# Patient Record
Sex: Female | Born: 1955
Health system: Southern US, Community
[De-identification: ages and names within clinical notes are randomized; demographics above are authoritative.]

## PROBLEM LIST (undated history)

## (undated) DIAGNOSIS — M1990 Unspecified osteoarthritis, unspecified site: Secondary | ICD-10-CM

## (undated) DIAGNOSIS — I10 Essential (primary) hypertension: Secondary | ICD-10-CM

---

## 2012-08-19 DIAGNOSIS — K219 Gastro-esophageal reflux disease without esophagitis: Secondary | ICD-10-CM | POA: Diagnosis present

## 2013-02-24 ENCOUNTER — Other Ambulatory Visit (HOSPITAL_COMMUNITY): Payer: Self-pay | Admitting: Chiropractic Medicine

## 2013-02-24 ENCOUNTER — Ambulatory Visit (HOSPITAL_COMMUNITY)
Admission: RE | Admit: 2013-02-24 | Discharge: 2013-02-24 | Disposition: A | Discharge status: Home / Self Care | Payer: Self-pay | Source: Ambulatory Visit | Attending: Chiropractic Medicine | Admitting: Chiropractic Medicine

## 2013-02-24 DIAGNOSIS — M545 Low back pain, unspecified: Secondary | ICD-10-CM

## 2013-02-24 DIAGNOSIS — M5137 Other intervertebral disc degeneration, lumbosacral region: Secondary | ICD-10-CM | POA: Insufficient documentation

## 2013-02-24 DIAGNOSIS — M51379 Other intervertebral disc degeneration, lumbosacral region without mention of lumbar back pain or lower extremity pain: Secondary | ICD-10-CM | POA: Insufficient documentation

## 2013-02-24 DIAGNOSIS — M47817 Spondylosis without myelopathy or radiculopathy, lumbosacral region: Secondary | ICD-10-CM | POA: Insufficient documentation

## 2016-01-09 DIAGNOSIS — F900 Attention-deficit hyperactivity disorder, predominantly inattentive type: Secondary | ICD-10-CM | POA: Diagnosis not present

## 2016-01-09 DIAGNOSIS — F482 Pseudobulbar affect: Secondary | ICD-10-CM | POA: Diagnosis not present

## 2016-01-09 DIAGNOSIS — F4312 Post-traumatic stress disorder, chronic: Secondary | ICD-10-CM | POA: Diagnosis not present

## 2016-02-05 DIAGNOSIS — M62830 Muscle spasm of back: Secondary | ICD-10-CM | POA: Diagnosis not present

## 2016-03-05 DIAGNOSIS — F482 Pseudobulbar affect: Secondary | ICD-10-CM | POA: Diagnosis not present

## 2016-03-05 DIAGNOSIS — F900 Attention-deficit hyperactivity disorder, predominantly inattentive type: Secondary | ICD-10-CM | POA: Diagnosis not present

## 2016-03-05 DIAGNOSIS — F4312 Post-traumatic stress disorder, chronic: Secondary | ICD-10-CM | POA: Diagnosis not present

## 2016-03-21 ENCOUNTER — Encounter (HOSPITAL_COMMUNITY): Payer: Self-pay

## 2016-03-21 ENCOUNTER — Ambulatory Visit (HOSPITAL_COMMUNITY)
Admission: EM | Admit: 2016-03-21 | Discharge: 2016-03-21 | Disposition: A | Discharge status: Home / Self Care | Payer: BLUE CROSS/BLUE SHIELD | Attending: Family Medicine | Admitting: Family Medicine

## 2016-03-21 DIAGNOSIS — M1990 Unspecified osteoarthritis, unspecified site: Secondary | ICD-10-CM | POA: Insufficient documentation

## 2016-03-21 DIAGNOSIS — Z882 Allergy status to sulfonamides status: Secondary | ICD-10-CM | POA: Insufficient documentation

## 2016-03-21 DIAGNOSIS — Z91040 Latex allergy status: Secondary | ICD-10-CM | POA: Diagnosis not present

## 2016-03-21 DIAGNOSIS — Z79899 Other long term (current) drug therapy: Secondary | ICD-10-CM | POA: Diagnosis not present

## 2016-03-21 DIAGNOSIS — I10 Essential (primary) hypertension: Secondary | ICD-10-CM | POA: Insufficient documentation

## 2016-03-21 DIAGNOSIS — N390 Urinary tract infection, site not specified: Secondary | ICD-10-CM | POA: Diagnosis not present

## 2016-03-21 HISTORY — DX: Unspecified osteoarthritis, unspecified site: M19.90

## 2016-03-21 HISTORY — DX: Essential (primary) hypertension: I10

## 2016-03-21 LAB — POCT URINALYSIS DIP (DEVICE)
BILIRUBIN URINE: NEGATIVE
GLUCOSE, UA: NEGATIVE mg/dL
HGB URINE DIPSTICK: NEGATIVE
Ketones, ur: NEGATIVE mg/dL
LEUKOCYTES UA: NEGATIVE
NITRITE: NEGATIVE
Protein, ur: NEGATIVE mg/dL
Specific Gravity, Urine: 1.015 (ref 1.005–1.030)
UROBILINOGEN UA: 0.2 mg/dL (ref 0.0–1.0)
pH: 6.5 (ref 5.0–8.0)

## 2016-03-21 MED ORDER — CEPHALEXIN 500 MG PO CAPS: 500.0000 mg | ORAL_CAPSULE | Freq: Four times a day (QID) | ORAL | 0 refills | Status: DC

## 2016-03-21 NOTE — ED Provider Notes (Signed)
CSN: [id number]     Arrival date & time 03/21/16  1855 History   None    Chief Complaint  Patient presents with  . Urinary Tract Infection   (Consider location/radiation/quality/duration/timing/severity/associated sxs/prior Treatment) The history is provided by the patient. No language interpreter was used.  Urinary Tract Infection  Pain quality:  Aching Pain severity:  Moderate Onset quality:  Gradual Duration:  2 days Timing:  Constant Progression:  Worsening Chronicity:  New Recent urinary tract infections: no   Relieved by:  Nothing Worsened by:  Nothing Ineffective treatments:  None tried Urinary symptoms: frequent urination   Associated symptoms: no abdominal pain   Risk factors: no sexually transmitted infections     Past Medical History:  Diagnosis Date  . Arthritis   . Hypertension    History reviewed. No pertinent surgical history. History reviewed. No pertinent family history. Social History  Substance Use Topics  . Smoking status: Never Smoker  . Smokeless tobacco: Never Used  . Alcohol use No   OB History    No data available     Review of Systems  Gastrointestinal: Negative for abdominal pain.  All other systems reviewed and are negative.   Allergies  Latex and Sulfa antibiotics  Home Medications   Prior to Admission medications   Medication Sig Start Date End Date Taking? Authorizing Provider  amphetamine-dextroamphetamine (ADDERALL) 20 MG tablet Take 20 mg by mouth daily.   Yes Historical Provider, MD  Dextromethorphan-Quinidine (NUEDEXTA PO) Take 200 mg by mouth daily.   Yes Historical Provider, MD  hydrochlorothiazide (HYDRODIURIL) 25 MG tablet Take 25 mg by mouth daily.   Yes Historical Provider, MD  triamterene-hydrochlorothiazide (DYAZIDE) 37.5-25 MG capsule Take 1 capsule by mouth daily.   Yes Historical Provider, MD  cephALEXin (KEFLEX) 500 MG capsule Take 1 capsule (500 mg total) by mouth 4 (four) times daily. 03/21/16   Elson AreasLeslie K  Ketty Bitton, PA-C   Meds Ordered and Administered this Visit  Medications - No data to display  BP 125/80 (BP Location: Left Arm)   Pulse 74   Temp 98.7 F (37.1 C) (Oral)   Resp 17   SpO2 100%  No data found.   Physical Exam  Constitutional: She appears well-developed and well-nourished. No distress.  HENT:  Head: Normocephalic and atraumatic.  Eyes: Conjunctivae are normal.  Neck: Neck supple.  Cardiovascular: Normal rate and regular rhythm.   No murmur heard. Pulmonary/Chest: Effort normal and breath sounds normal. No respiratory distress.  Abdominal: Soft. There is tenderness.  Musculoskeletal: She exhibits no edema.  Neurological: She is alert.  Skin: Skin is warm and dry.  Psychiatric: She has a normal mood and affect.  Nursing note and vitals reviewed.   Urgent Care Course   Clinical Course    Procedures (including critical care time)  Labs Review Labs Reviewed - No data to display  Imaging Review No results found.   Visual Acuity Review  Right Eye Distance:   Left Eye Distance:   Bilateral Distance:    Right Eye Near:   Left Eye Near:    Bilateral Near:         MDM urine dip is negative    1. UTI (lower urinary tract infection)    Meds ordered this encounter  Medications  . triamterene-hydrochlorothiazide (DYAZIDE) 37.5-25 MG capsule    Sig: Take 1 capsule by mouth daily.  . hydrochlorothiazide (HYDRODIURIL) 25 MG tablet    Sig: Take 25 mg by mouth daily.  Marland Kitchen. amphetamine-dextroamphetamine (ADDERALL)  20 MG tablet    Sig: Take 20 mg by mouth daily.  Marland Kitchen Dextromethorphan-Quinidine (NUEDEXTA PO)    Sig: Take 200 mg by mouth daily.  . cephALEXin (KEFLEX) 500 MG capsule    Sig: Take 1 capsule (500 mg total) by mouth 4 (four) times daily.    Dispense:  40 capsule    Refill:  0    Order Specific Question:   Supervising Provider    Answer:   Linna Hoff [phone]   An After Visit Summary was printed and given to the patient.   Elson Areas,  PA-C 03/21/16 1957     Lonia Skinner Clinton, PA-C 03/21/16 2039

## 2016-03-21 NOTE — Discharge Instructions (Signed)
Return if any problems.

## 2016-03-21 NOTE — ED Triage Notes (Signed)
Patient present with possible UTI, symptoms have been lower abdominal pain associated with pressure during urination, urine dark in color x3 days No acute distress

## 2016-03-23 LAB — URINE CULTURE

## 2016-04-29 DIAGNOSIS — Z760 Encounter for issue of repeat prescription: Secondary | ICD-10-CM | POA: Diagnosis not present

## 2016-05-06 DIAGNOSIS — J069 Acute upper respiratory infection, unspecified: Secondary | ICD-10-CM | POA: Diagnosis not present

## 2016-05-06 DIAGNOSIS — E669 Obesity, unspecified: Secondary | ICD-10-CM | POA: Diagnosis not present

## 2016-05-06 DIAGNOSIS — E876 Hypokalemia: Secondary | ICD-10-CM | POA: Diagnosis not present

## 2016-05-06 DIAGNOSIS — R030 Elevated blood-pressure reading, without diagnosis of hypertension: Secondary | ICD-10-CM | POA: Diagnosis not present

## 2016-05-06 DIAGNOSIS — Z8701 Personal history of pneumonia (recurrent): Secondary | ICD-10-CM | POA: Diagnosis not present

## 2016-05-06 DIAGNOSIS — Z6841 Body Mass Index (BMI) 40.0 and over, adult: Secondary | ICD-10-CM | POA: Diagnosis not present

## 2016-05-06 DIAGNOSIS — R05 Cough: Secondary | ICD-10-CM | POA: Diagnosis not present

## 2016-05-06 DIAGNOSIS — R509 Fever, unspecified: Secondary | ICD-10-CM | POA: Diagnosis not present

## 2016-05-07 DIAGNOSIS — L700 Acne vulgaris: Secondary | ICD-10-CM | POA: Diagnosis not present

## 2016-05-07 DIAGNOSIS — E538 Deficiency of other specified B group vitamins: Secondary | ICD-10-CM | POA: Diagnosis not present

## 2016-05-07 DIAGNOSIS — J209 Acute bronchitis, unspecified: Secondary | ICD-10-CM | POA: Diagnosis not present

## 2016-05-07 DIAGNOSIS — E559 Vitamin D deficiency, unspecified: Secondary | ICD-10-CM | POA: Diagnosis not present

## 2016-05-07 DIAGNOSIS — R600 Localized edema: Secondary | ICD-10-CM | POA: Diagnosis not present

## 2016-05-09 DIAGNOSIS — F4312 Post-traumatic stress disorder, chronic: Secondary | ICD-10-CM | POA: Diagnosis not present

## 2016-05-09 DIAGNOSIS — E559 Vitamin D deficiency, unspecified: Secondary | ICD-10-CM | POA: Diagnosis not present

## 2016-05-09 DIAGNOSIS — E538 Deficiency of other specified B group vitamins: Secondary | ICD-10-CM | POA: Diagnosis not present

## 2016-05-09 DIAGNOSIS — Z791 Long term (current) use of non-steroidal anti-inflammatories (NSAID): Secondary | ICD-10-CM | POA: Diagnosis not present

## 2016-05-09 DIAGNOSIS — F900 Attention-deficit hyperactivity disorder, predominantly inattentive type: Secondary | ICD-10-CM | POA: Diagnosis not present

## 2016-05-09 DIAGNOSIS — E611 Iron deficiency: Secondary | ICD-10-CM | POA: Diagnosis not present

## 2016-05-09 DIAGNOSIS — Z9884 Bariatric surgery status: Secondary | ICD-10-CM | POA: Diagnosis not present

## 2016-05-09 DIAGNOSIS — F482 Pseudobulbar affect: Secondary | ICD-10-CM | POA: Diagnosis not present

## 2016-05-09 DIAGNOSIS — K912 Postsurgical malabsorption, not elsewhere classified: Secondary | ICD-10-CM | POA: Diagnosis not present

## 2016-08-29 DIAGNOSIS — F482 Pseudobulbar affect: Secondary | ICD-10-CM | POA: Diagnosis not present

## 2016-08-29 DIAGNOSIS — F900 Attention-deficit hyperactivity disorder, predominantly inattentive type: Secondary | ICD-10-CM | POA: Diagnosis not present

## 2016-08-29 DIAGNOSIS — F4312 Post-traumatic stress disorder, chronic: Secondary | ICD-10-CM | POA: Diagnosis not present

## 2016-11-24 DIAGNOSIS — F4312 Post-traumatic stress disorder, chronic: Secondary | ICD-10-CM | POA: Diagnosis not present

## 2016-11-24 DIAGNOSIS — F900 Attention-deficit hyperactivity disorder, predominantly inattentive type: Secondary | ICD-10-CM | POA: Diagnosis not present

## 2016-11-24 DIAGNOSIS — F482 Pseudobulbar affect: Secondary | ICD-10-CM | POA: Diagnosis not present

## 2016-12-25 DIAGNOSIS — Z1231 Encounter for screening mammogram for malignant neoplasm of breast: Secondary | ICD-10-CM | POA: Diagnosis not present

## 2016-12-25 DIAGNOSIS — I10 Essential (primary) hypertension: Secondary | ICD-10-CM | POA: Diagnosis not present

## 2016-12-25 DIAGNOSIS — F988 Other specified behavioral and emotional disorders with onset usually occurring in childhood and adolescence: Secondary | ICD-10-CM | POA: Diagnosis not present

## 2017-01-06 DIAGNOSIS — N390 Urinary tract infection, site not specified: Secondary | ICD-10-CM | POA: Diagnosis not present

## 2017-01-22 DIAGNOSIS — F900 Attention-deficit hyperactivity disorder, predominantly inattentive type: Secondary | ICD-10-CM | POA: Diagnosis not present

## 2017-01-22 DIAGNOSIS — F4312 Post-traumatic stress disorder, chronic: Secondary | ICD-10-CM | POA: Diagnosis not present

## 2017-01-22 DIAGNOSIS — F482 Pseudobulbar affect: Secondary | ICD-10-CM | POA: Diagnosis not present

## 2017-02-05 DIAGNOSIS — F4310 Post-traumatic stress disorder, unspecified: Secondary | ICD-10-CM | POA: Diagnosis not present

## 2017-02-05 DIAGNOSIS — F902 Attention-deficit hyperactivity disorder, combined type: Secondary | ICD-10-CM | POA: Diagnosis not present

## 2017-02-19 DIAGNOSIS — F902 Attention-deficit hyperactivity disorder, combined type: Secondary | ICD-10-CM | POA: Diagnosis not present

## 2017-02-19 DIAGNOSIS — F4310 Post-traumatic stress disorder, unspecified: Secondary | ICD-10-CM | POA: Diagnosis not present

## 2017-03-13 DIAGNOSIS — F4310 Post-traumatic stress disorder, unspecified: Secondary | ICD-10-CM | POA: Diagnosis not present

## 2017-03-13 DIAGNOSIS — Z1231 Encounter for screening mammogram for malignant neoplasm of breast: Secondary | ICD-10-CM | POA: Diagnosis not present

## 2017-03-13 DIAGNOSIS — F900 Attention-deficit hyperactivity disorder, predominantly inattentive type: Secondary | ICD-10-CM | POA: Diagnosis not present

## 2017-04-16 DIAGNOSIS — L237 Allergic contact dermatitis due to plants, except food: Secondary | ICD-10-CM | POA: Diagnosis not present

## 2017-05-13 DIAGNOSIS — F4310 Post-traumatic stress disorder, unspecified: Secondary | ICD-10-CM | POA: Diagnosis not present

## 2017-05-13 DIAGNOSIS — F900 Attention-deficit hyperactivity disorder, predominantly inattentive type: Secondary | ICD-10-CM | POA: Diagnosis not present

## 2017-06-18 DIAGNOSIS — J069 Acute upper respiratory infection, unspecified: Secondary | ICD-10-CM | POA: Diagnosis not present

## 2017-08-28 DIAGNOSIS — F900 Attention-deficit hyperactivity disorder, predominantly inattentive type: Secondary | ICD-10-CM | POA: Diagnosis not present

## 2017-08-28 DIAGNOSIS — F4310 Post-traumatic stress disorder, unspecified: Secondary | ICD-10-CM | POA: Diagnosis not present

## 2017-10-14 DIAGNOSIS — F901 Attention-deficit hyperactivity disorder, predominantly hyperactive type: Secondary | ICD-10-CM | POA: Diagnosis not present

## 2017-10-14 DIAGNOSIS — I10 Essential (primary) hypertension: Secondary | ICD-10-CM | POA: Diagnosis not present

## 2017-10-14 DIAGNOSIS — R609 Edema, unspecified: Secondary | ICD-10-CM | POA: Diagnosis not present

## 2017-10-28 DIAGNOSIS — M9901 Segmental and somatic dysfunction of cervical region: Secondary | ICD-10-CM | POA: Diagnosis not present

## 2017-10-28 DIAGNOSIS — M9906 Segmental and somatic dysfunction of lower extremity: Secondary | ICD-10-CM | POA: Diagnosis not present

## 2017-10-28 DIAGNOSIS — M9904 Segmental and somatic dysfunction of sacral region: Secondary | ICD-10-CM | POA: Diagnosis not present

## 2017-10-28 DIAGNOSIS — M9903 Segmental and somatic dysfunction of lumbar region: Secondary | ICD-10-CM | POA: Diagnosis not present

## 2017-10-29 DIAGNOSIS — M9903 Segmental and somatic dysfunction of lumbar region: Secondary | ICD-10-CM | POA: Diagnosis not present

## 2017-10-29 DIAGNOSIS — M9901 Segmental and somatic dysfunction of cervical region: Secondary | ICD-10-CM | POA: Diagnosis not present

## 2017-10-29 DIAGNOSIS — M9904 Segmental and somatic dysfunction of sacral region: Secondary | ICD-10-CM | POA: Diagnosis not present

## 2017-10-29 DIAGNOSIS — M9906 Segmental and somatic dysfunction of lower extremity: Secondary | ICD-10-CM | POA: Diagnosis not present

## 2017-10-30 DIAGNOSIS — I10 Essential (primary) hypertension: Secondary | ICD-10-CM | POA: Diagnosis not present

## 2017-10-30 DIAGNOSIS — R609 Edema, unspecified: Secondary | ICD-10-CM | POA: Diagnosis not present

## 2017-10-30 DIAGNOSIS — M130 Polyarthritis, unspecified: Secondary | ICD-10-CM | POA: Diagnosis not present

## 2017-10-30 DIAGNOSIS — F901 Attention-deficit hyperactivity disorder, predominantly hyperactive type: Secondary | ICD-10-CM | POA: Diagnosis not present

## 2017-11-02 DIAGNOSIS — M9906 Segmental and somatic dysfunction of lower extremity: Secondary | ICD-10-CM | POA: Diagnosis not present

## 2017-11-02 DIAGNOSIS — M9901 Segmental and somatic dysfunction of cervical region: Secondary | ICD-10-CM | POA: Diagnosis not present

## 2017-11-02 DIAGNOSIS — M9904 Segmental and somatic dysfunction of sacral region: Secondary | ICD-10-CM | POA: Diagnosis not present

## 2017-11-02 DIAGNOSIS — M9903 Segmental and somatic dysfunction of lumbar region: Secondary | ICD-10-CM | POA: Diagnosis not present

## 2017-11-12 DIAGNOSIS — M25561 Pain in right knee: Secondary | ICD-10-CM | POA: Diagnosis not present

## 2017-11-12 DIAGNOSIS — M1712 Unilateral primary osteoarthritis, left knee: Secondary | ICD-10-CM | POA: Diagnosis not present

## 2017-11-12 DIAGNOSIS — M1711 Unilateral primary osteoarthritis, right knee: Secondary | ICD-10-CM | POA: Diagnosis not present

## 2017-11-12 DIAGNOSIS — M25562 Pain in left knee: Secondary | ICD-10-CM | POA: Diagnosis not present

## 2017-11-12 DIAGNOSIS — M5442 Lumbago with sciatica, left side: Secondary | ICD-10-CM | POA: Diagnosis not present

## 2017-11-27 ENCOUNTER — Other Ambulatory Visit: Payer: Self-pay | Admitting: Orthopedic Surgery

## 2017-11-27 DIAGNOSIS — M5442 Lumbago with sciatica, left side: Secondary | ICD-10-CM

## 2017-12-07 ENCOUNTER — Ambulatory Visit
Admission: RE | Admit: 2017-12-07 | Discharge: 2017-12-07 | Disposition: A | Discharge status: Home / Self Care | Payer: BLUE CROSS/BLUE SHIELD | Source: Ambulatory Visit | Attending: Orthopedic Surgery | Admitting: Orthopedic Surgery

## 2017-12-07 DIAGNOSIS — M48061 Spinal stenosis, lumbar region without neurogenic claudication: Secondary | ICD-10-CM | POA: Diagnosis not present

## 2017-12-07 DIAGNOSIS — M5442 Lumbago with sciatica, left side: Secondary | ICD-10-CM

## 2017-12-14 DIAGNOSIS — F900 Attention-deficit hyperactivity disorder, predominantly inattentive type: Secondary | ICD-10-CM | POA: Diagnosis not present

## 2017-12-14 DIAGNOSIS — F4310 Post-traumatic stress disorder, unspecified: Secondary | ICD-10-CM | POA: Diagnosis not present

## 2017-12-21 DIAGNOSIS — M545 Low back pain: Secondary | ICD-10-CM | POA: Diagnosis not present

## 2017-12-21 DIAGNOSIS — M25461 Effusion, right knee: Secondary | ICD-10-CM | POA: Diagnosis not present

## 2017-12-21 DIAGNOSIS — M5441 Lumbago with sciatica, right side: Secondary | ICD-10-CM | POA: Diagnosis not present

## 2017-12-21 DIAGNOSIS — M5442 Lumbago with sciatica, left side: Secondary | ICD-10-CM | POA: Diagnosis not present

## 2017-12-21 DIAGNOSIS — I10 Essential (primary) hypertension: Secondary | ICD-10-CM | POA: Diagnosis not present

## 2018-01-05 DIAGNOSIS — M5416 Radiculopathy, lumbar region: Secondary | ICD-10-CM | POA: Diagnosis not present

## 2018-01-14 ENCOUNTER — Emergency Department (HOSPITAL_COMMUNITY): Payer: BLUE CROSS/BLUE SHIELD

## 2018-01-14 ENCOUNTER — Emergency Department (HOSPITAL_COMMUNITY)
Admission: EM | Admit: 2018-01-14 | Discharge: 2018-01-14 | Disposition: A | Discharge status: Home / Self Care | Payer: BLUE CROSS/BLUE SHIELD | Attending: Emergency Medicine | Admitting: Emergency Medicine

## 2018-01-14 ENCOUNTER — Encounter (HOSPITAL_COMMUNITY): Payer: Self-pay | Admitting: Emergency Medicine

## 2018-01-14 ENCOUNTER — Other Ambulatory Visit: Payer: Self-pay

## 2018-01-14 DIAGNOSIS — M1711 Unilateral primary osteoarthritis, right knee: Secondary | ICD-10-CM | POA: Diagnosis not present

## 2018-01-14 DIAGNOSIS — R0602 Shortness of breath: Secondary | ICD-10-CM

## 2018-01-14 DIAGNOSIS — Z79899 Other long term (current) drug therapy: Secondary | ICD-10-CM | POA: Insufficient documentation

## 2018-01-14 DIAGNOSIS — R42 Dizziness and giddiness: Secondary | ICD-10-CM | POA: Diagnosis not present

## 2018-01-14 DIAGNOSIS — M79601 Pain in right arm: Secondary | ICD-10-CM

## 2018-01-14 DIAGNOSIS — M1712 Unilateral primary osteoarthritis, left knee: Secondary | ICD-10-CM | POA: Diagnosis not present

## 2018-01-14 LAB — CBC
HCT: 41.3 % (ref 36.0–46.0)
HEMOGLOBIN: 13.7 g/dL (ref 12.0–15.0)
MCH: 30 pg (ref 26.0–34.0)
MCHC: 33.2 g/dL (ref 30.0–36.0)
MCV: 90.4 fL (ref 78.0–100.0)
PLATELETS: 288 10*3/uL (ref 150–400)
RBC: 4.57 MIL/uL (ref 3.87–5.11)
RDW: 13.3 % (ref 11.5–15.5)
WBC: 6 10*3/uL (ref 4.0–10.5)

## 2018-01-14 LAB — BASIC METABOLIC PANEL
ANION GAP: 10 (ref 5–15)
BUN: 11 mg/dL (ref 6–20)
CALCIUM: 9.4 mg/dL (ref 8.9–10.3)
CO2: 29 mmol/L (ref 22–32)
CREATININE: 0.96 mg/dL (ref 0.44–1.00)
Chloride: 101 mmol/L (ref 101–111)
GLUCOSE: 106 mg/dL — AB (ref 65–99)
Potassium: 3.6 mmol/L (ref 3.5–5.1)
Sodium: 140 mmol/L (ref 135–145)

## 2018-01-14 LAB — D-DIMER, QUANTITATIVE: D-Dimer, Quant: 0.41 ug/mL-FEU (ref 0.00–0.50)

## 2018-01-14 LAB — I-STAT TROPONIN, ED: TROPONIN I, POC: 0 ng/mL (ref 0.00–0.08)

## 2018-01-14 NOTE — Discharge Instructions (Signed)
Please read and follow all provided instructions.  Your diagnoses today include:  1. Shortness of breath   2. Pain of right upper extremity     Tests performed today include:  An EKG of your heart  A chest x-ray  Cardiac enzymes - a blood test for heart muscle damage  Blood counts and electrolytes  D-dimer - test to screen for blood clot that was normal  Vital signs. See below for your results today.   Medications prescribed:   None  Take any prescribed medications only as directed.  Follow-up instructions: Please follow-up with your primary care provider as soon as you can for further evaluation of your symptoms.   Return instructions:  SEEK IMMEDIATE MEDICAL ATTENTION IF:  You have severe chest pain, especially if the pain is crushing or pressure-like and spreads to the arms, back, neck, or jaw, or if you have sweating, nausea (feeling sick to your stomach), or shortness of breath. THIS IS AN EMERGENCY. Don't wait to see if the pain will go away. Get medical help at once. Call 911 or 0 (operator). DO NOT drive yourself to the hospital.   Your chest pain gets worse and does not go away with rest.   You have an attack of chest pain lasting longer than usual, despite rest and treatment with the medications your caregiver has prescribed.   You wake from sleep with chest pain or shortness of breath.  You feel dizzy or faint.  You have chest pain not typical of your usual pain for which you originally saw your caregiver.   You have any other emergent concerns regarding your health.  Additional Information: Chest pain comes from many different causes. Your caregiver has diagnosed you as having chest pain that is not specific for one problem, but does not require admission.  You are at low risk for an acute heart condition or other serious illness.   Your vital signs today were: BP (!) 142/75    Pulse 79    Temp 97.6 F (36.4 C) (Oral)    Resp 17    SpO2 97%  If your  blood pressure (BP) was elevated above 135/85 this visit, please have this repeated by your doctor within one month. --------------

## 2018-01-14 NOTE — ED Provider Notes (Signed)
[facility] Provider Note   CSN: [id number] Arrival date & time: 01/14/18  1209     History   Chief Complaint Chief Complaint  Patient presents with  . Shortness of Breath  . Arm Pain    HPI Kara Acevedo is a 62 y.o. female.  Patient with history of hypertension, degenerative disc disease presents with complaints of intermittent right arm pain described as being deep without swelling.  No burning or shooting.  Not worse with movement or palpation.  This is been occurring for about 3 days.  She also notes a change in her exercise tolerance with increasing exertional shortness of breath over the past several days.  She reports that when she walks upstairs she needs to stop and take a break.  She does not have any associated chest pain.  Patient's family was concerned that this could be cardiac in nature, prompting evaluation today.  Patient denies any new lower extremity swelling or pain.  No fevers or cough.  No diaphoresis.  No history of DVT and patient is not anticoagulated. She denies arm pain at the current time.  The onset of this condition was acute. The course is intermittent. Aggravating factors: exertion. Alleviating factors: none.       Past Medical History:  Diagnosis Date  . Arthritis   . Hypertension     There are no active problems to display for this patient.   History reviewed. No pertinent surgical history.   OB History   None      Home Medications    Prior to Admission medications   Medication Sig Start Date End Date Taking? Authorizing Provider  amphetamine-dextroamphetamine (ADDERALL) 15 MG tablet Take 15 mg by mouth daily.    Yes [provider]  mirabegron ER (MYRBETRIQ) 50 MG TB24 tablet Take 50 mg by mouth daily.   Yes [provider]  triamterene-hydrochlorothiazide (DYAZIDE) 37.5-25 MG capsule Take 1 capsule by mouth daily.   Yes [provider]  cephALEXin  (KEFLEX) 500 MG capsule Take 1 capsule (500 mg total) by mouth 4 (four) times daily. Patient not taking: Reported on 01/14/2018 03/21/16   Osie CheeksSofia, Leslie K, PA-C    Family History History reviewed. No pertinent family history.  Social History Social History   Tobacco Use  . Smoking status: Never Smoker  . Smokeless tobacco: Never Used  Substance Use Topics  . Alcohol use: No  . Drug use: No     Allergies   Latex and Sulfa antibiotics   Review of Systems Review of Systems  Constitutional: Negative for fever.  HENT: Negative for rhinorrhea and sore throat.   Eyes: Negative for redness.  Respiratory: Positive for shortness of breath. Negative for cough.   Cardiovascular: Negative for chest pain.  Gastrointestinal: Negative for abdominal pain, diarrhea, nausea and vomiting.  Genitourinary: Negative for dysuria.  Musculoskeletal: Positive for myalgias.  Skin: Negative for rash.  Neurological: Positive for dizziness. Negative for headaches.     Physical Exam Updated Vital Signs BP (!) 148/82   Pulse 96   Temp 97.6 F (36.4 C) (Oral)   Resp 10   SpO2 100%   Physical Exam  Constitutional: She appears well-developed and well-nourished.  HENT:  Head: Normocephalic and atraumatic.  Mouth/Throat: Oropharynx is clear and moist and mucous membranes are normal. Mucous membranes are not dry.  Eyes: Conjunctivae are normal.  Neck: Trachea normal and normal range of motion. Neck supple. Normal carotid pulses and no JVD present.  No muscular tenderness present. Carotid bruit is not present. No tracheal deviation present.  Cardiovascular: Normal rate, regular rhythm, S1 normal, S2 normal, normal heart sounds and intact distal pulses. Exam reveals no decreased pulses.  No murmur heard. Pulses:      Radial pulses are 2+ on the right side, and 2+ on the left side.       Dorsalis pedis pulses are 2+ on the right side, and 2+ on the left side.  Pulmonary/Chest: Effort normal. No  respiratory distress. She has no wheezes. She exhibits no tenderness.  Abdominal: Soft. Normal aorta and bowel sounds are normal. There is no tenderness. There is no rebound and no guarding.  Musculoskeletal: Normal range of motion. She exhibits edema. She exhibits no tenderness.       Right shoulder: She exhibits normal range of motion, no tenderness and no bony tenderness.       Right elbow: Normal.She exhibits normal range of motion and no swelling.       Right wrist: She exhibits normal range of motion and no tenderness.       Cervical back: Normal. She exhibits normal range of motion and no tenderness.       Right upper arm: She exhibits no tenderness, no bony tenderness and no swelling.       Right forearm: She exhibits no tenderness and no bony tenderness.  Trace LE edema, chronic per patient. No clinical signs suggestive of DVT. No tenderness.   Neurological: She is alert.  Skin: Skin is warm and dry. She is not diaphoretic. No cyanosis. No pallor.  Psychiatric: She has a normal mood and affect.  Nursing note and vitals reviewed.    ED Treatments / Results  Labs (all labs ordered are listed, but only abnormal results are displayed) Labs Reviewed  BASIC METABOLIC PANEL - Abnormal; Notable for the following components:      Result Value   Glucose, Bld 106 (*)    All other components within normal limits  CBC  D-DIMER, QUANTITATIVE (NOT AT [facility])  I-STAT TROPONIN, ED    EKG EKG Interpretation  Date/Time:  Thursday January 14 2018 12:18:13 EDT Ventricular Rate:  73 PR Interval:  136 QRS Duration: 90 QT Interval:  402 QTC Calculation: 442 R Axis:   15 Text Interpretation:  Normal sinus rhythm Nonspecific ST and T wave abnormality Abnormal ECG no prior to compare with Confirmed by Meridee Score [phone]) on 01/14/2018 5:45:20 PM   Radiology Dg Chest 2 View  Result Date: 01/14/2018 CLINICAL DATA:  62 year old female with intermittent shortness of breath, dizziness and right  arm pain for 3-4 days. EXAM: CHEST - 2 VIEW COMPARISON:  Lumbar MRI 12/07/2017 FINDINGS: Evidence of a small to moderate gastric hiatal hernia. Mild tortuosity of the thoracic aorta. Other mediastinal contours are within normal limits. Visualized tracheal air column is within normal limits. Lung volumes are within normal limits. Both lungs appear clear. No pneumothorax or pleural effusion. No acute osseous abnormality identified. Negative visible bowel gas pattern. IMPRESSION: No acute cardiopulmonary abnormality. Hiatal hernia. Electronically Signed   By: Odessa Fleming M.D.   On: 01/14/2018 13:07    Procedures Procedures (including critical care time)  Medications Ordered in ED Medications - No data to display   Initial Impression / Assessment and Plan / ED Course  I have reviewed the triage vital signs and the nursing notes.  Pertinent labs & imaging results that were available during my care of the patient were reviewed by me  and considered in my medical decision making (see chart for details).     Patient seen and examined. Work-up reviewed.  To this point cardiac work-up is reassuring.  However shortness of breath symptoms are concerning.  Patient is low risk [place].  Will check age-adjusted d-dimer to determine if CT angiography is necessary.  Discussed this with patient.  Vital signs reviewed and are as follows: BP (!) 148/82   Pulse 96   Temp 97.6 F (36.4 C) (Oral)   Resp 10   SpO2 100%   7:15 PM D-dimer negative. No return of symptoms. Feel comfortable with d/c to home. Symptoms atypical for ACS. Discussed importance of PCP follow-up especially if symptoms continue.  Patient was counseled to return with severe chest pain, especially if the pain is crushing or pressure-like and spreads to the arms, back, neck, or jaw, or if they have sweating, nausea, or shortness of breath with the pain. They were encouraged to call 911 with these symptoms.   They were also told to return if their  chest pain gets worse and does not go away with rest, they have an attack of chest pain lasting longer than usual despite rest and treatment with the medications their caregiver has prescribed, if they wake from sleep with chest pain or shortness of breath, if they feel dizzy or faint, if they have chest pain not typical of their usual pain, or if they have any other emergent concerns regarding their health.  The patient verbalized understanding and agreed.    Final Clinical Impressions(s) / ED Diagnoses   Final diagnoses:  Shortness of breath  Pain of right upper extremity   Patient presents with exertional shortness of breath and right arm pain.  No chest pains.  Initial concern was to see if symptoms were from cardiac etiology.  EKG reassuring with negative troponin.  D-dimer was checked given reported shortness of breath, this was normal.  Arm pain did not recur in the emergency department.  Symptoms are nonexertional and atypical for ACS or PE.  Symptoms are not really reproducible and musculoskeletal pain is considered less likely.  Possibly radicular in nature.  No indications for admission at this time.  Patient has primary care with whom she can follow-up.  Return instructions as above and patient seems reliable to return with symptoms.  Patient came to the emergency department today at the urging of her daughter who is a Theatre stage manager.  ED Discharge Orders    None       Renne Crigler, Cordelia Poche 01/14/18 1918    Terrilee Files, MD 01/16/18 [phone]

## 2018-01-14 NOTE — ED Triage Notes (Signed)
Patient presents to ED for assessment of right arm pain, intermittent x 3 days.  Patient went to have her knees injected today, began to have pain again with shortness of breath and intermittent dizziness.  Patient denies nausea or vomiting.

## 2018-02-17 DIAGNOSIS — M5416 Radiculopathy, lumbar region: Secondary | ICD-10-CM | POA: Diagnosis not present

## 2018-02-22 DIAGNOSIS — R079 Chest pain, unspecified: Secondary | ICD-10-CM | POA: Diagnosis not present

## 2018-03-08 DIAGNOSIS — F4310 Post-traumatic stress disorder, unspecified: Secondary | ICD-10-CM | POA: Diagnosis not present

## 2018-03-08 DIAGNOSIS — F900 Attention-deficit hyperactivity disorder, predominantly inattentive type: Secondary | ICD-10-CM | POA: Diagnosis not present

## 2018-03-08 DIAGNOSIS — M5416 Radiculopathy, lumbar region: Secondary | ICD-10-CM | POA: Diagnosis not present

## 2018-03-29 DIAGNOSIS — Z131 Encounter for screening for diabetes mellitus: Secondary | ICD-10-CM | POA: Diagnosis not present

## 2018-03-29 DIAGNOSIS — M170 Bilateral primary osteoarthritis of knee: Secondary | ICD-10-CM | POA: Diagnosis not present

## 2018-03-29 DIAGNOSIS — Z0189 Encounter for other specified special examinations: Secondary | ICD-10-CM | POA: Diagnosis not present

## 2018-03-29 DIAGNOSIS — Z13220 Encounter for screening for lipoid disorders: Secondary | ICD-10-CM | POA: Diagnosis not present

## 2018-03-29 DIAGNOSIS — I10 Essential (primary) hypertension: Secondary | ICD-10-CM | POA: Diagnosis not present

## 2018-04-21 DIAGNOSIS — M170 Bilateral primary osteoarthritis of knee: Secondary | ICD-10-CM | POA: Diagnosis not present

## 2018-04-21 DIAGNOSIS — E785 Hyperlipidemia, unspecified: Secondary | ICD-10-CM | POA: Diagnosis not present

## 2018-04-21 DIAGNOSIS — I10 Essential (primary) hypertension: Secondary | ICD-10-CM | POA: Diagnosis not present

## 2018-06-14 DIAGNOSIS — F900 Attention-deficit hyperactivity disorder, predominantly inattentive type: Secondary | ICD-10-CM | POA: Diagnosis not present

## 2018-06-14 DIAGNOSIS — F4310 Post-traumatic stress disorder, unspecified: Secondary | ICD-10-CM | POA: Diagnosis not present

## 2018-09-20 DIAGNOSIS — H02839 Dermatochalasis of unspecified eye, unspecified eyelid: Secondary | ICD-10-CM | POA: Diagnosis not present

## 2018-10-07 DIAGNOSIS — F900 Attention-deficit hyperactivity disorder, predominantly inattentive type: Secondary | ICD-10-CM | POA: Diagnosis not present

## 2018-10-07 DIAGNOSIS — F4310 Post-traumatic stress disorder, unspecified: Secondary | ICD-10-CM | POA: Diagnosis not present

## 2018-12-27 IMAGING — DX DG CHEST 2V
2 series · 2 of 2 positions shown · non-contrast
Comparison: Lumbar MRI 12/07/2017

CLINICAL DATA: 61-year-old female with intermittent shortness of
breath, dizziness and right arm pain for 3-4 days.

EXAM:
CHEST - 2 VIEW

[w chest pa]
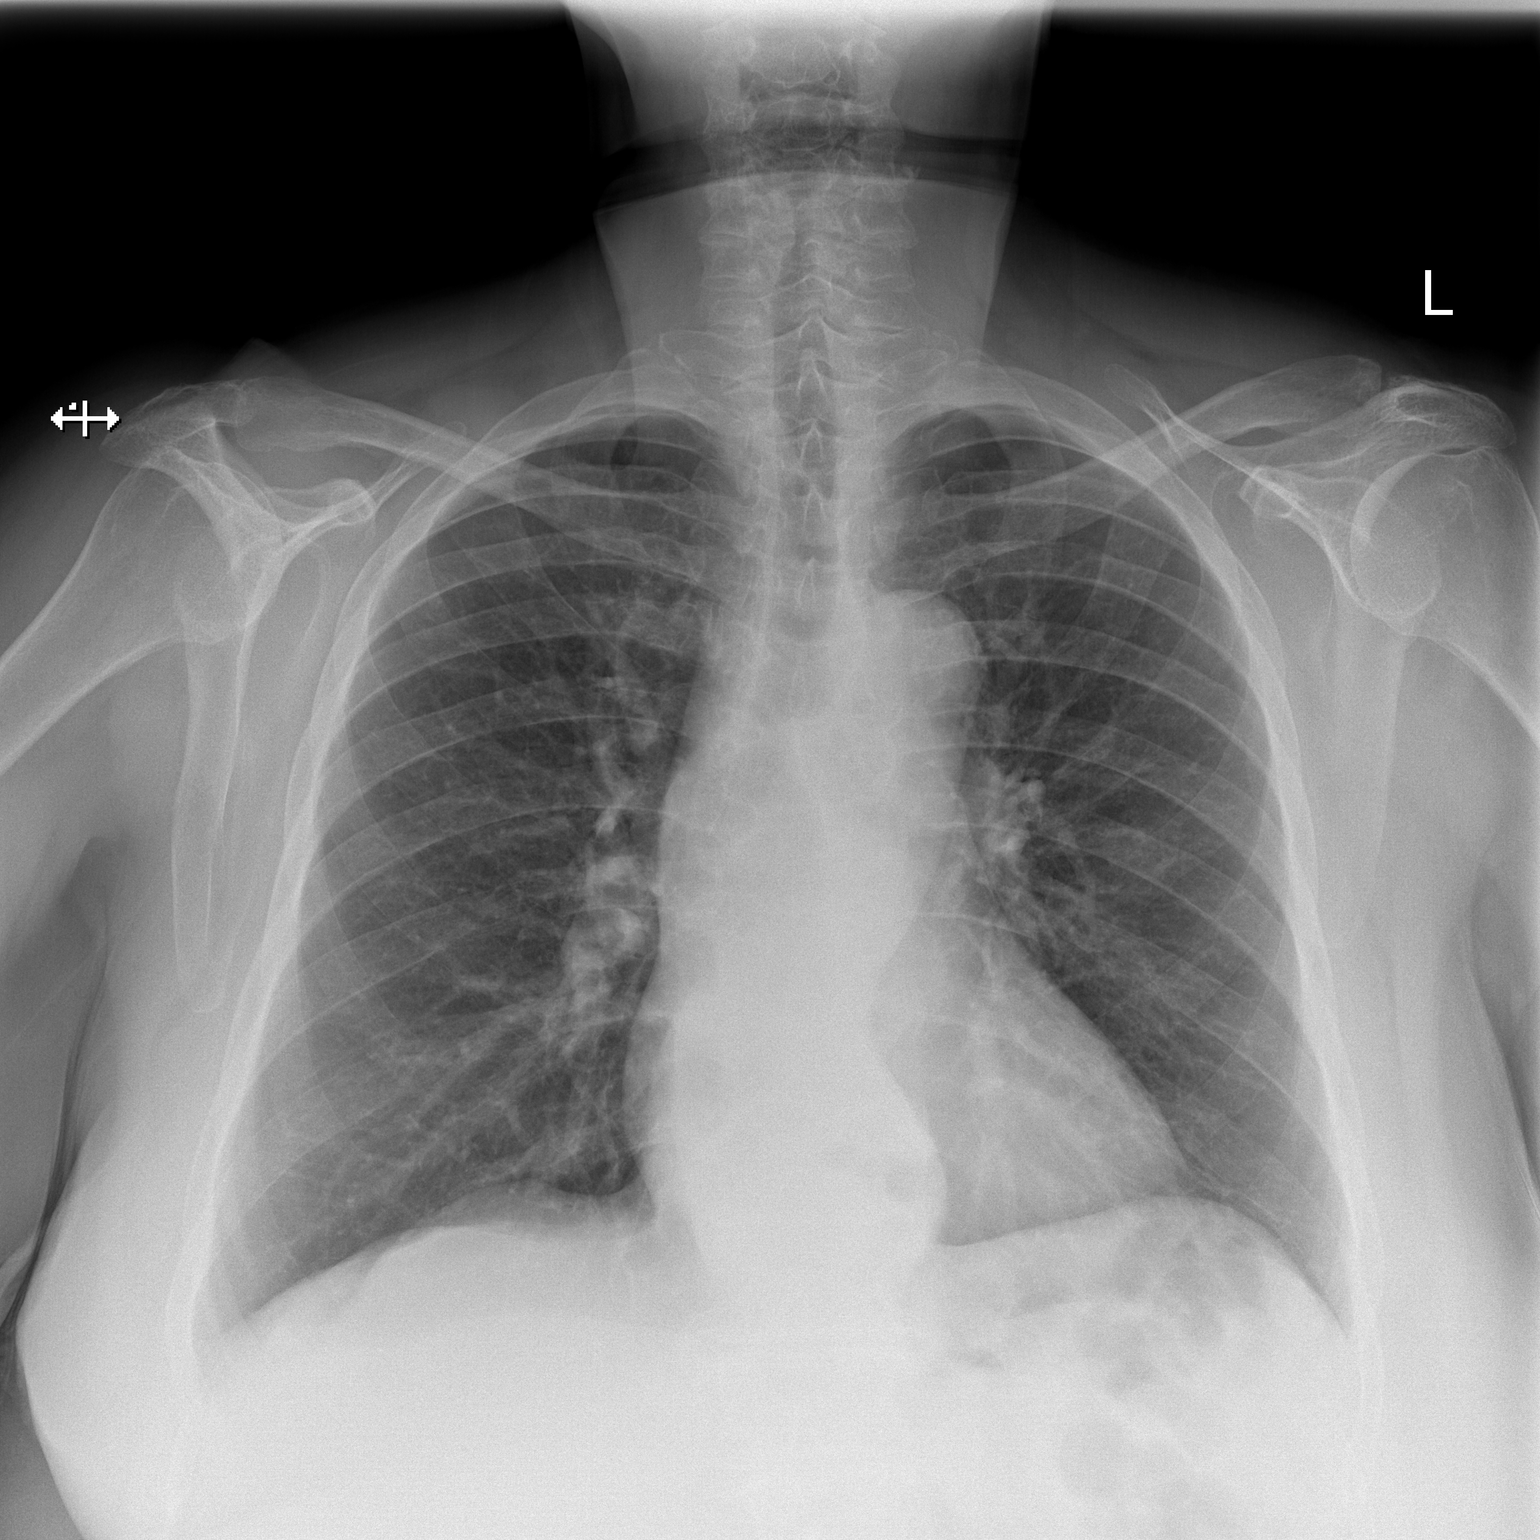

[w chest lat]
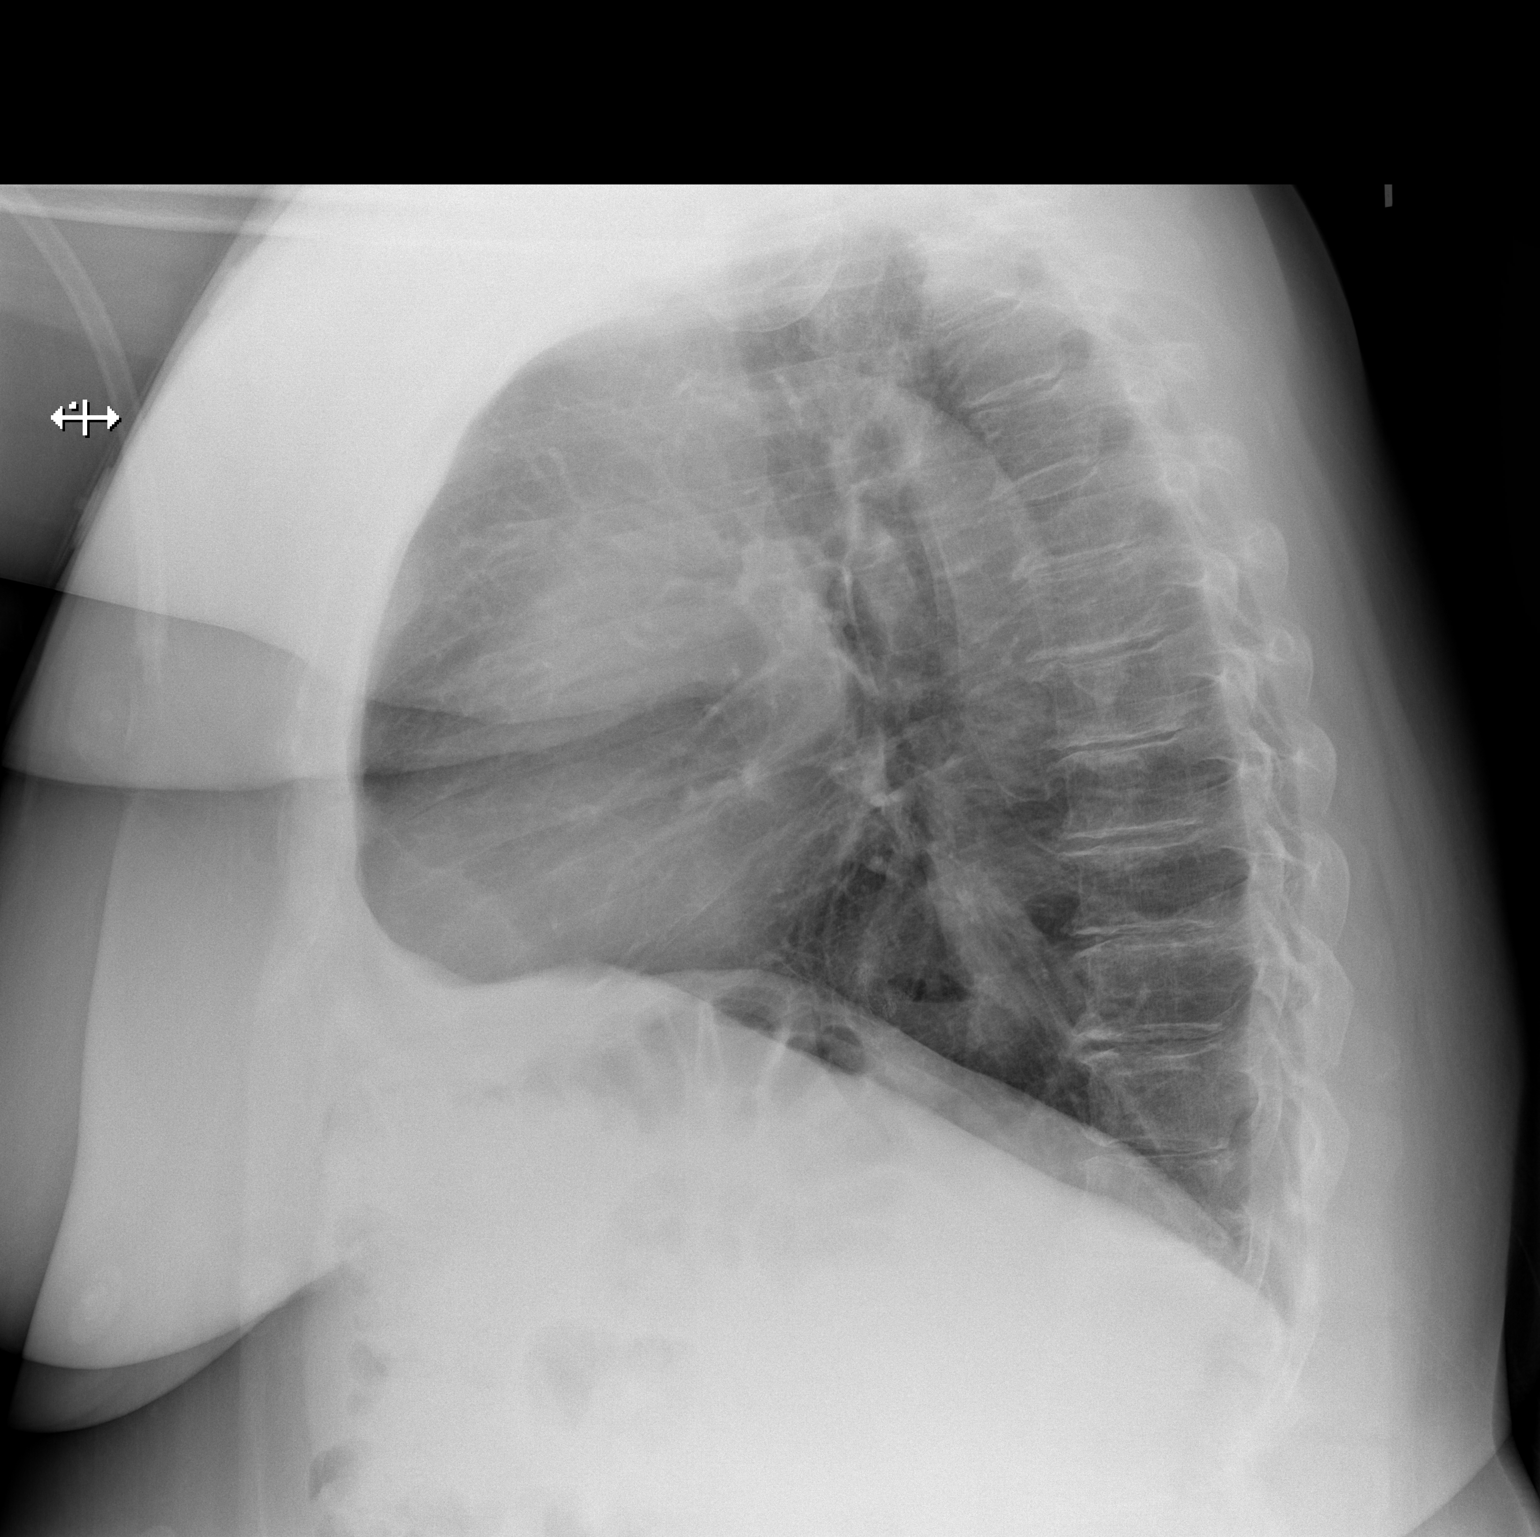

[2 of 2 positions shown; findings below may reference images not displayed]

FINDINGS: Evidence of a small to moderate gastric hiatal hernia. Mild
tortuosity of the thoracic aorta. Other mediastinal contours are
within normal limits. Visualized tracheal air column is within
normal limits. Lung volumes are within normal limits. Both lungs
appear clear. No pneumothorax or pleural effusion. No acute osseous
abnormality identified. Negative visible bowel gas pattern.
IMPRESSION: No acute cardiopulmonary abnormality. Hiatal hernia.

## 2019-01-11 DIAGNOSIS — F900 Attention-deficit hyperactivity disorder, predominantly inattentive type: Secondary | ICD-10-CM | POA: Diagnosis not present

## 2019-01-11 DIAGNOSIS — F4310 Post-traumatic stress disorder, unspecified: Secondary | ICD-10-CM | POA: Diagnosis not present

## 2019-02-10 DIAGNOSIS — F4310 Post-traumatic stress disorder, unspecified: Secondary | ICD-10-CM | POA: Diagnosis not present

## 2019-02-10 DIAGNOSIS — F900 Attention-deficit hyperactivity disorder, predominantly inattentive type: Secondary | ICD-10-CM | POA: Diagnosis not present

## 2019-03-15 DIAGNOSIS — F900 Attention-deficit hyperactivity disorder, predominantly inattentive type: Secondary | ICD-10-CM | POA: Diagnosis not present

## 2019-03-15 DIAGNOSIS — F4310 Post-traumatic stress disorder, unspecified: Secondary | ICD-10-CM | POA: Diagnosis not present

## 2019-06-07 DIAGNOSIS — F4310 Post-traumatic stress disorder, unspecified: Secondary | ICD-10-CM | POA: Diagnosis not present

## 2019-06-07 DIAGNOSIS — F900 Attention-deficit hyperactivity disorder, predominantly inattentive type: Secondary | ICD-10-CM | POA: Diagnosis not present

## 2019-08-31 DIAGNOSIS — Z23 Encounter for immunization: Secondary | ICD-10-CM | POA: Diagnosis not present

## 2019-09-05 DIAGNOSIS — F900 Attention-deficit hyperactivity disorder, predominantly inattentive type: Secondary | ICD-10-CM | POA: Diagnosis not present

## 2019-09-05 DIAGNOSIS — F4310 Post-traumatic stress disorder, unspecified: Secondary | ICD-10-CM | POA: Diagnosis not present

## 2019-09-28 DIAGNOSIS — Z23 Encounter for immunization: Secondary | ICD-10-CM | POA: Diagnosis not present

## 2019-12-05 DIAGNOSIS — F902 Attention-deficit hyperactivity disorder, combined type: Secondary | ICD-10-CM | POA: Diagnosis not present

## 2020-01-30 DIAGNOSIS — I10 Essential (primary) hypertension: Secondary | ICD-10-CM | POA: Diagnosis not present

## 2020-01-30 DIAGNOSIS — E559 Vitamin D deficiency, unspecified: Secondary | ICD-10-CM | POA: Diagnosis not present

## 2020-01-30 DIAGNOSIS — F900 Attention-deficit hyperactivity disorder, predominantly inattentive type: Secondary | ICD-10-CM | POA: Diagnosis not present

## 2020-01-30 DIAGNOSIS — E538 Deficiency of other specified B group vitamins: Secondary | ICD-10-CM | POA: Diagnosis not present

## 2020-05-22 DIAGNOSIS — F902 Attention-deficit hyperactivity disorder, combined type: Secondary | ICD-10-CM | POA: Diagnosis not present

## 2020-05-24 DIAGNOSIS — M79605 Pain in left leg: Secondary | ICD-10-CM | POA: Diagnosis not present

## 2020-05-24 DIAGNOSIS — M1712 Unilateral primary osteoarthritis, left knee: Secondary | ICD-10-CM | POA: Diagnosis not present

## 2020-06-27 DIAGNOSIS — Z23 Encounter for immunization: Secondary | ICD-10-CM | POA: Diagnosis not present

## 2020-06-28 DIAGNOSIS — F902 Attention-deficit hyperactivity disorder, combined type: Secondary | ICD-10-CM | POA: Diagnosis not present

## 2020-07-30 DIAGNOSIS — F900 Attention-deficit hyperactivity disorder, predominantly inattentive type: Secondary | ICD-10-CM | POA: Diagnosis not present

## 2020-07-30 DIAGNOSIS — I10 Essential (primary) hypertension: Secondary | ICD-10-CM | POA: Diagnosis not present

## 2020-07-30 DIAGNOSIS — E559 Vitamin D deficiency, unspecified: Secondary | ICD-10-CM | POA: Diagnosis not present

## 2020-07-30 DIAGNOSIS — E538 Deficiency of other specified B group vitamins: Secondary | ICD-10-CM | POA: Diagnosis not present

## 2022-02-22 ENCOUNTER — Encounter (HOSPITAL_COMMUNITY): Payer: Self-pay

## 2022-02-22 ENCOUNTER — Emergency Department (HOSPITAL_COMMUNITY): Payer: BC Managed Care – PPO

## 2022-02-22 ENCOUNTER — Inpatient Hospital Stay (HOSPITAL_COMMUNITY)
Admission: EM | Admit: 2022-02-22 | Discharge: 2022-02-28 | DRG: 481 | Disposition: A | Discharge status: Home Health | Payer: BC Managed Care – PPO | Attending: Internal Medicine | Admitting: Internal Medicine

## 2022-02-22 ENCOUNTER — Other Ambulatory Visit: Payer: Self-pay

## 2022-02-22 DIAGNOSIS — Z91040 Latex allergy status: Secondary | ICD-10-CM

## 2022-02-22 DIAGNOSIS — R0781 Pleurodynia: Secondary | ICD-10-CM

## 2022-02-22 DIAGNOSIS — Z888 Allergy status to other drugs, medicaments and biological substances status: Secondary | ICD-10-CM

## 2022-02-22 DIAGNOSIS — S72009A Fracture of unspecified part of neck of unspecified femur, initial encounter for closed fracture: Secondary | ICD-10-CM | POA: Diagnosis not present

## 2022-02-22 DIAGNOSIS — M1990 Unspecified osteoarthritis, unspecified site: Secondary | ICD-10-CM | POA: Diagnosis present

## 2022-02-22 DIAGNOSIS — N179 Acute kidney failure, unspecified: Secondary | ICD-10-CM | POA: Diagnosis present

## 2022-02-22 DIAGNOSIS — F909 Attention-deficit hyperactivity disorder, unspecified type: Secondary | ICD-10-CM

## 2022-02-22 DIAGNOSIS — I10 Essential (primary) hypertension: Secondary | ICD-10-CM

## 2022-02-22 DIAGNOSIS — S72142A Displaced intertrochanteric fracture of left femur, initial encounter for closed fracture: Secondary | ICD-10-CM | POA: Diagnosis not present

## 2022-02-22 DIAGNOSIS — E876 Hypokalemia: Secondary | ICD-10-CM | POA: Diagnosis present

## 2022-02-22 DIAGNOSIS — Z881 Allergy status to other antibiotic agents status: Secondary | ICD-10-CM

## 2022-02-22 DIAGNOSIS — Z20822 Contact with and (suspected) exposure to covid-19: Secondary | ICD-10-CM | POA: Diagnosis present

## 2022-02-22 DIAGNOSIS — S72002A Fracture of unspecified part of neck of left femur, initial encounter for closed fracture: Principal | ICD-10-CM

## 2022-02-22 DIAGNOSIS — Z79899 Other long term (current) drug therapy: Secondary | ICD-10-CM

## 2022-02-22 DIAGNOSIS — Z6835 Body mass index (BMI) 35.0-35.9, adult: Secondary | ICD-10-CM

## 2022-02-22 DIAGNOSIS — K5900 Constipation, unspecified: Secondary | ICD-10-CM | POA: Diagnosis not present

## 2022-02-22 MED ORDER — HYDROMORPHONE HCL 1 MG/ML IJ SOLN
1.0000 mg | Freq: Once | INTRAMUSCULAR | Status: AC
  Administered 2022-02-23: 1 mg via INTRAVENOUS
  Filled 2022-02-22: qty 1

## 2022-02-22 MED ORDER — ONDANSETRON HCL 4 MG/2ML IJ SOLN
4.0000 mg | Freq: Once | INTRAMUSCULAR | Status: AC
  Administered 2022-02-22: 4 mg via INTRAVENOUS
  Filled 2022-02-22: qty 2
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
## 2022-02-22 NOTE — ED Provider Notes (Signed)
[facility]-EMERGENCY DEPT [facility] Emergency Department Provider Note MRN:  [id number]  Arrival date & time: 02/23/22     Chief Complaint   Fall   History of Present Illness   Kara Acevedo is a 66 y.o. year-old female with a history of hypertension presenting to the ED with chief complaint of fall.  Patient was getting out of the car and she thinks maybe her shoe shifted or she stepped on something uneven causing her to fall.  Landed on her left hip.  Hip is severely painful and she is unable to move it or walk.  Denies head trauma, no loss of consciousness, no neck or back pain, no chest pain or shortness of breath, no abdominal pain.  Review of Systems  A thorough review of systems was obtained and all systems are negative except as noted in the HPI and PMH.   Patient's Health History    Past Medical History:  Diagnosis Date   Arthritis    Hypertension     History reviewed. No pertinent surgical history.  History reviewed. No pertinent family history.  Social History   Socioeconomic History   Marital status: Married    Spouse name: Not on file   Number of children: Not on file   Years of education: Not on file   Highest education level: Not on file  Occupational History   Not on file  Tobacco Use   Smoking status: Never   Smokeless tobacco: Never  Substance and Sexual Activity   Alcohol use: No   Drug use: No   Sexual activity: Never  Other Topics Concern   Not on file  Social History Narrative   Not on file   Social Determinants of Health   Financial Resource Strain: Not on file  Food Insecurity: Not on file  Transportation Needs: Not on file  Physical Activity: Not on file  Stress: Not on file  Social Connections: Not on file  Intimate Partner Violence: Not on file     Physical Exam   Vitals:   02/23/22 0000 02/23/22 0015  BP: (!) 152/87 120/63  Pulse: 65 70  Resp: 18 18  Temp:    SpO2: 99% 100%    CONSTITUTIONAL:  Well-appearing, NAD NEURO/PSYCH:  Alert and oriented x 3, no focal deficits EYES:  eyes equal and reactive ENT/NECK:  no LAD, no JVD CARDIO: Regular rate, well-perfused, normal S1 and S2 PULM:  CTAB no wheezing or rhonchi GI/GU:  non-distended, non-tender MSK/SPINE:  No gross deformities, no edema, significant reduction in range of motion of the left lower extremity due to pain, neurovascular intact distally SKIN:  no rash, atraumatic   *Additional and/or pertinent findings included in MDM below  Diagnostic and Interventional Summary    EKG Interpretation  Date/Time:    Ventricular Rate:    PR Interval:    QRS Duration:   QT Interval:    QTC Calculation:   R Axis:     Text Interpretation:         Labs Reviewed  COMPREHENSIVE METABOLIC PANEL - Abnormal; Notable for the following components:      Result Value   Potassium 2.3 (*)    Glucose, Bld 103 (*)    Creatinine, Ser 1.26 (*)    Albumin 3.3 (*)    GFR, Estimated 47 (*)    All other components within normal limits  SARS CORONAVIRUS 2 BY RT PCR  CBC  MAGNESIUM    DG Hip Unilat W or Wo Pelvis  2-3 Views Left  Final Result    DG Knee 1-2 Views Left  Final Result      Medications  potassium chloride 10 mEq in 100 mL IVPB (has no administration in time range)  HYDROmorphone (DILAUDID) injection 1 mg (1 mg Intravenous Given 02/23/22 0007)  ondansetron (ZOFRAN) injection 4 mg (4 mg Intravenous Given 02/22/22 2359)     Procedures  /  Critical Care Procedures  ED Course and Medical Decision Making  Initial Impression and Ddx Suspicious for hip fracture versus dislocation.  Awaiting x-rays.  Past medical/surgical history that increases complexity of ED encounter: None  Interpretation of Diagnostics I personally reviewed the hip x-ray and my interpretation is as follows: Intertrochanteric fracture    Patient Reassessment and Ultimate Disposition/Management     Discussed case with Dr. Shon Baton who will see  patient in the morning.  N.p.o., will admit to medicine.  Patient management required discussion with the following services or consulting groups:  Hospitalist Service and Orthopedic Surgery  Complexity of Problems Addressed Acute illness or injury that poses threat of life of bodily function  Additional Data Reviewed and Analyzed Further history obtained from: Further history from spouse/family member  Additional Factors Impacting ED Encounter Risk Use of parenteral controlled substances and Consideration of hospitalization  Elmer Sow. Pilar Plate, MD [facility] Emergency Medicine [facility] [email or web address]  Final Clinical Impressions(s) / ED Diagnoses     ICD-10-CM   1. Closed fracture of left hip, initial encounter [facility])  S72.002A       ED Discharge Orders     None        Discharge Instructions Discussed with and Provided to Patient:   Discharge Instructions   None      Sabas Sous, MD 02/23/22 0124

## 2022-02-23 ENCOUNTER — Encounter (HOSPITAL_COMMUNITY): Payer: Self-pay | Admitting: Internal Medicine

## 2022-02-23 DIAGNOSIS — E876 Hypokalemia: Secondary | ICD-10-CM | POA: Diagnosis present

## 2022-02-23 DIAGNOSIS — Z91040 Latex allergy status: Secondary | ICD-10-CM | POA: Diagnosis not present

## 2022-02-23 DIAGNOSIS — K5900 Constipation, unspecified: Secondary | ICD-10-CM | POA: Diagnosis not present

## 2022-02-23 DIAGNOSIS — R0781 Pleurodynia: Secondary | ICD-10-CM

## 2022-02-23 DIAGNOSIS — Z888 Allergy status to other drugs, medicaments and biological substances status: Secondary | ICD-10-CM | POA: Diagnosis not present

## 2022-02-23 DIAGNOSIS — S72009A Fracture of unspecified part of neck of unspecified femur, initial encounter for closed fracture: Secondary | ICD-10-CM | POA: Diagnosis present

## 2022-02-23 DIAGNOSIS — S72001A Fracture of unspecified part of neck of right femur, initial encounter for closed fracture: Secondary | ICD-10-CM | POA: Diagnosis not present

## 2022-02-23 DIAGNOSIS — I10 Essential (primary) hypertension: Secondary | ICD-10-CM | POA: Diagnosis present

## 2022-02-23 DIAGNOSIS — F909 Attention-deficit hyperactivity disorder, unspecified type: Secondary | ICD-10-CM | POA: Diagnosis present

## 2022-02-23 DIAGNOSIS — S72002A Fracture of unspecified part of neck of left femur, initial encounter for closed fracture: Secondary | ICD-10-CM | POA: Diagnosis not present

## 2022-02-23 DIAGNOSIS — Z79899 Other long term (current) drug therapy: Secondary | ICD-10-CM | POA: Diagnosis not present

## 2022-02-23 DIAGNOSIS — Z6835 Body mass index (BMI) 35.0-35.9, adult: Secondary | ICD-10-CM | POA: Diagnosis not present

## 2022-02-23 DIAGNOSIS — S72142A Displaced intertrochanteric fracture of left femur, initial encounter for closed fracture: Secondary | ICD-10-CM | POA: Diagnosis present

## 2022-02-23 DIAGNOSIS — Z881 Allergy status to other antibiotic agents status: Secondary | ICD-10-CM | POA: Diagnosis not present

## 2022-02-23 DIAGNOSIS — Z20822 Contact with and (suspected) exposure to covid-19: Secondary | ICD-10-CM | POA: Diagnosis present

## 2022-02-23 DIAGNOSIS — N179 Acute kidney failure, unspecified: Secondary | ICD-10-CM | POA: Diagnosis present

## 2022-02-23 DIAGNOSIS — M1990 Unspecified osteoarthritis, unspecified site: Secondary | ICD-10-CM | POA: Diagnosis present

## 2022-02-23 LAB — BASIC METABOLIC PANEL
Anion gap: 9 (ref 5–15)
BUN: 18 mg/dL (ref 8–23)
CO2: 26 mmol/L (ref 22–32)
Calcium: 8.6 mg/dL — ABNORMAL LOW (ref 8.9–10.3)
Chloride: 102 mmol/L (ref 98–111)
Creatinine, Ser: 0.96 mg/dL (ref 0.44–1.00)
GFR, Estimated: >60 mL/min (ref >60)
Glucose, Bld: 94 mg/dL (ref 70–99)
Potassium: 2.4 mmol/L — CL (ref 3.5–5.1)
Sodium: 137 mmol/L (ref 135–145)

## 2022-02-23 LAB — COMPREHENSIVE METABOLIC PANEL
ALT: 14 U/L (ref 0–44)
AST: 20 U/L (ref 15–41)
Albumin: 3.3 g/dL — ABNORMAL LOW (ref 3.5–5.0)
Alkaline Phosphatase: 57 U/L (ref 38–126)
Anion gap: 7 (ref 5–15)
BUN: 20 mg/dL (ref 8–23)
CO2: 27 mmol/L (ref 22–32)
Calcium: 9 mg/dL (ref 8.9–10.3)
Chloride: 104 mmol/L (ref 98–111)
Creatinine, Ser: 1.26 mg/dL — ABNORMAL HIGH (ref 0.44–1.00)
GFR, Estimated: 47 mL/min — ABNORMAL LOW (ref >60)
Glucose, Bld: 103 mg/dL — ABNORMAL HIGH (ref 70–99)
Potassium: 2.3 mmol/L — CL (ref 3.5–5.1)
Sodium: 138 mmol/L (ref 135–145)
Total Bilirubin: 0.9 mg/dL (ref 0.3–1.2)
Total Protein: 6.7 g/dL (ref 6.5–8.1)

## 2022-02-23 LAB — CBC
HCT: 35.4 % — ABNORMAL LOW (ref 36.0–46.0)
HCT: 36.2 % (ref 36.0–46.0)
Hemoglobin: 12.3 g/dL (ref 12.0–15.0)
Hemoglobin: 12.6 g/dL (ref 12.0–15.0)
MCH: 31.4 pg (ref 26.0–34.0)
MCH: 31.5 pg (ref 26.0–34.0)
MCHC: 34.7 g/dL (ref 30.0–36.0)
MCHC: 34.8 g/dL (ref 30.0–36.0)
MCV: 90.3 fL (ref 80.0–100.0)
MCV: 90.5 fL (ref 80.0–100.0)
Platelets: 243 10*3/uL (ref 150–400)
Platelets: 280 10*3/uL (ref 150–400)
RBC: 3.91 MIL/uL (ref 3.87–5.11)
RBC: 4.01 MIL/uL (ref 3.87–5.11)
RDW: 13.2 % (ref 11.5–15.5)
RDW: 13.2 % (ref 11.5–15.5)
WBC: 10.4 10*3/uL (ref 4.0–10.5)
WBC: 10.8 10*3/uL — ABNORMAL HIGH (ref 4.0–10.5)
nRBC: 0 % (ref 0.0–0.2)
nRBC: 0 % (ref 0.0–0.2)

## 2022-02-23 LAB — TROPONIN I (HIGH SENSITIVITY): Troponin I (High Sensitivity): 4 ng/L (ref <18)

## 2022-02-23 LAB — POTASSIUM: Potassium: 2.9 mmol/L — ABNORMAL LOW (ref 3.5–5.1)

## 2022-02-23 LAB — SARS CORONAVIRUS 2 BY RT PCR: SARS Coronavirus 2 by RT PCR: NEGATIVE

## 2022-02-23 LAB — MAGNESIUM: Magnesium: 2.6 mg/dL — ABNORMAL HIGH (ref 1.7–2.4)

## 2022-02-23 LAB — HIV ANTIBODY (ROUTINE TESTING W REFLEX): HIV Screen 4th Generation wRfx: NONREACTIVE

## 2022-02-23 MED ORDER — HYDRALAZINE HCL 20 MG/ML IJ SOLN: 10.0000 mg | INTRAMUSCULAR | Status: DC | PRN

## 2022-02-23 MED ORDER — POTASSIUM CHLORIDE CRYS ER 20 MEQ PO TBCR: 40.0000 meq | EXTENDED_RELEASE_TABLET | Freq: Once | ORAL | Status: DC

## 2022-02-23 MED ORDER — ONDANSETRON HCL 4 MG/2ML IJ SOLN
4.0000 mg | Freq: Four times a day (QID) | INTRAMUSCULAR | Status: DC | PRN
  Administered 2022-02-23: 4 mg via INTRAVENOUS
  Filled 2022-02-23: qty 2

## 2022-02-23 MED ORDER — ORAL CARE MOUTH RINSE: 15.0000 mL | OROMUCOSAL | Status: DC | PRN

## 2022-02-23 MED ORDER — POTASSIUM CHLORIDE 20 MEQ PO PACK
60.0000 meq | PACK | Freq: Once | ORAL | Status: AC
  Administered 2022-02-23: 60 meq via ORAL
  Filled 2022-02-23: qty 3

## 2022-02-23 MED ORDER — HYDROMORPHONE HCL 1 MG/ML IJ SOLN
0.5000 mg | INTRAMUSCULAR | Status: DC | PRN
  Administered 2022-02-23 – 2022-02-27 (×15): 0.5 mg via INTRAVENOUS
  Filled 2022-02-23 (×7): qty 0.5
  Filled 2022-02-23: qty 1
  Filled 2022-02-23 (×8): qty 0.5

## 2022-02-23 MED ORDER — POTASSIUM CHLORIDE 10 MEQ/100ML IV SOLN
10.0000 meq | INTRAVENOUS | Status: AC
  Administered 2022-02-23 (×3): 10 meq via INTRAVENOUS
  Filled 2022-02-23: qty 100

## 2022-02-23 MED ORDER — POTASSIUM CHLORIDE 10 MEQ/100ML IV SOLN
10.0000 meq | INTRAVENOUS | Status: AC
  Administered 2022-02-23 (×4): 10 meq via INTRAVENOUS
  Filled 2022-02-23 (×4): qty 100

## 2022-02-23 NOTE — Assessment & Plan Note (Addendum)
This is improving.  We were going to obtain a rib x-ray, but the patient feels this is improving and I do not think it is necessary.

## 2022-02-23 NOTE — Assessment & Plan Note (Signed)
BP normal off meds - Hold amlodipine, indapamide for now

## 2022-02-23 NOTE — Plan of Care (Signed)

## 2022-02-23 NOTE — Hospital Course (Signed)
Kara Acevedo is a 66 y.o. F with HTN, ADHD who presented with hip fracture.   7/16: Admitted, Ortho deferred to weekday service 7/17: Underwent hip block, Ortho plan for OR in the afternoon

## 2022-02-23 NOTE — Assessment & Plan Note (Signed)
K supplemented

## 2022-02-23 NOTE — Assessment & Plan Note (Signed)
BMI 35.5 with hypertension, osteoarthritis.

## 2022-02-23 NOTE — Assessment & Plan Note (Signed)
-   Hold adderall

## 2022-02-23 NOTE — Consult Note (Signed)
Reason for Consult: Left hip pain   Kara Acevedo is an 66 y.o. female.  HPI: This is a very pleasant 66 year old female who came in through the emergency department status post mechanical fall.  She states that she was getting out of her car yesterday night and she slipped and fell landed directly on that left hip.  She came into the emergency department complaining of significant left hip pain.  Dr. Shon Baton was consulted.  Patient states that she is having significant left-sided hip pain.  Having difficulty with any kind of movement.  She has had previous orthopedic surgery by a surgeon in [place].  Patient has difficulty with any kind of left hip movement.  Past Medical History:  Diagnosis Date   Arthritis    Hypertension     History reviewed. No pertinent surgical history.  Family History  Family history unknown: Yes    Social History:  reports that she has never smoked. She has never used smokeless tobacco. She reports that she does not drink alcohol and does not use drugs.  Allergies:  Allergies  Allergen Reactions   Doxycycline Nausea And Vomiting   Latex Other (See Comments)    Blisters    Sulfa Antibiotics Rash   Fluconazole Other (See Comments)    Feels like a burning sensation from inside out   Ace Inhibitors Cough    Medications: I have reviewed the patient's current medications.  Results for orders placed or performed during the hospital encounter of 02/22/22 (from the past 48 hour(s))  CBC     Status: None   Collection Time: 02/23/22 12:10 AM  Result Value Ref Range   WBC 10.4 4.0 - 10.5 K/uL   RBC 4.01 3.87 - 5.11 MIL/uL   Hemoglobin 12.6 12.0 - 15.0 g/dL   HCT 28.3 15.1 - 76.1 %   MCV 90.3 80.0 - 100.0 fL   MCH 31.4 26.0 - 34.0 pg   MCHC 34.8 30.0 - 36.0 g/dL   RDW 60.7 37.1 - 06.2 %   Platelets 280 150 - 400 K/uL   nRBC 0.0 0.0 - 0.2 %    Comment: Performed at [facility], [address] [place], [place] [address]   Comprehensive metabolic panel     Status: Abnormal   Collection Time: 02/23/22 12:10 AM  Result Value Ref Range   Sodium 138 135 - 145 mmol/L   Potassium 2.3 (LL) 3.5 - 5.1 mmol/L    Comment: CRITICAL RESULT CALLED TO, READ BACK BY AND VERIFIED WITH KADY, EMT.P @ 0115 ON 02/23/2022 BY LBROOKS, MLT    Chloride 104 98 - 111 mmol/L   CO2 27 22 - 32 mmol/L   Glucose, Bld 103 (H) 70 - 99 mg/dL    Comment: Glucose reference range applies only to samples taken after fasting for at least 8 hours.   BUN 20 8 - 23 mg/dL   Creatinine, Ser 4.62 (H) 0.44 - 1.00 mg/dL   Calcium 9.0 8.9 - 70.3 mg/dL   Total Protein 6.7 6.5 - 8.1 g/dL   Albumin 3.3 (L) 3.5 - 5.0 g/dL   AST 20 15 - 41 U/L   ALT 14 0 - 44 U/L   Alkaline Phosphatase 57 38 - 126 U/L   Total Bilirubin 0.9 0.3 - 1.2 mg/dL   GFR, Estimated 47 (L) >60 mL/min    Comment: (NOTE) Calculated using the CKD-EPI Creatinine Equation (2021)    Anion gap 7 5 - 15    Comment:  Performed at [facility], [address] [place], [place] [address]  Magnesium     Status: Abnormal   Collection Time: 02/23/22 12:10 AM  Result Value Ref Range   Magnesium 2.6 (H) 1.7 - 2.4 mg/dL    Comment: Performed at [facility], [address] [place], [place] [address]  SARS Coronavirus 2 by RT PCR (hospital order, performed in [facility] hospital lab) *cepheid single result test* Anterior Nasal Swab     Status: None   Collection Time: 02/23/22  1:11 AM   Specimen: Anterior Nasal Swab  Result Value Ref Range   SARS Coronavirus 2 by RT PCR NEGATIVE NEGATIVE    Comment: (NOTE) SARS-CoV-2 target nucleic acids are NOT DETECTED.  The SARS-CoV-2 RNA is generally detectable in upper and lower respiratory specimens during the acute phase of infection. The lowest concentration of SARS-CoV-2 viral copies this assay can detect is 250 copies / mL. A negative result does not preclude SARS-CoV-2 infection and should not be used as  the sole basis for treatment or other patient management decisions.  A negative result may occur with improper specimen collection / handling, submission of specimen other than nasopharyngeal swab, presence of viral mutation(s) within the areas targeted by this assay, and inadequate number of viral copies (<250 copies / mL). A negative result must be combined with clinical observations, patient history, and epidemiological information.  Fact Sheet for Patients:   RoadLapTop.co.za  Fact Sheet for Healthcare Providers: http://kim-miller.com/  This test is not yet approved or  cleared by the [place] FDA and has been authorized for detection and/or diagnosis of SARS-CoV-2 by FDA under an Emergency Use Authorization (EUA).  This EUA will remain in effect (meaning this test can be used) for the duration of the COVID-19 declaration under Section 564(b)(1) of the Act, 21 U.S.C. section 360bbb-3(b)(1), unless the authorization is terminated or revoked sooner.  Performed at [facility], [address], [place], [place] [address]   CBC     Status: Abnormal   Collection Time: 02/23/22  4:24 AM  Result Value Ref Range   WBC 10.8 (H) 4.0 - 10.5 K/uL   RBC 3.91 3.87 - 5.11 MIL/uL   Hemoglobin 12.3 12.0 - 15.0 g/dL   HCT 27.0 (L) 62.3 - 76.2 %   MCV 90.5 80.0 - 100.0 fL   MCH 31.5 26.0 - 34.0 pg   MCHC 34.7 30.0 - 36.0 g/dL   RDW 83.1 51.7 - 61.6 %   Platelets 243 150 - 400 K/uL   nRBC 0.0 0.0 - 0.2 %    Comment: Performed at [facility], [address] [place], [place] [address]  Basic metabolic panel     Status: Abnormal   Collection Time: 02/23/22  4:24 AM  Result Value Ref Range   Sodium 137 135 - 145 mmol/L   Potassium 2.4 (LL) 3.5 - 5.1 mmol/L    Comment: CRITICAL RESULT CALLED TO, READ BACK BY AND VERIFIED WITH VERONICA, RN @ [phone] ON 02/23/2022 BY LBROOKS, MLT    Chloride 102 98 - 111  mmol/L   CO2 26 22 - 32 mmol/L   Glucose, Bld 94 70 - 99 mg/dL    Comment: Glucose reference range applies only to samples taken after fasting for at least 8 hours.   BUN 18 8 - 23 mg/dL   Creatinine, Ser 9.48 0.44 - 1.00 mg/dL   Calcium 8.6 (L) 8.9 - 10.3 mg/dL  GFR, Estimated >60 >60 mL/min    Comment: (NOTE) Calculated using the CKD-EPI Creatinine Equation (2021)    Anion gap 9 5 - 15    Comment: Performed at [facility], [address], [place], [place] [address]  Troponin I (High Sensitivity)     Status: None   Collection Time: 02/23/22  4:24 AM  Result Value Ref Range   Troponin I (High Sensitivity) 4 <18 ng/L    Comment: (NOTE) Elevated high sensitivity troponin I (hsTnI) values and significant  changes across serial measurements may suggest ACS but many other  chronic and acute conditions are known to elevate hsTnI results.  Refer to the "Links" section for chest pain algorithms and additional  guidance. Performed at [facility], [address], [place], [place] [address]   Potassium     Status: Abnormal   Collection Time: 02/23/22  8:26 AM  Result Value Ref Range   Potassium 2.9 (L) 3.5 - 5.1 mmol/L    Comment: DELTA CHECK NOTED Performed at [facility], [address], [place], [place] [address]     DG Knee 1-2 Views Left  Result Date: 02/23/2022 CLINICAL DATA:  Fall, left hip pain EXAM: LEFT KNEE - 1-2 VIEW COMPARISON:  None Available. FINDINGS: Left total hip arthroplasty has been performed. Arthroplasty components are in anatomic alignment. Normal overall alignment. No acute fracture or dislocation. Small left knee effusion is present. Soft tissues are otherwise unremarkable. IMPRESSION: Small left knee effusion. No acute fracture or dislocation. Electronically Signed   By: Helyn Numbers M.D.   On: 02/23/2022 00:50   DG Hip Unilat W or Wo Pelvis 2-3 Views Left  Result Date: 02/23/2022 CLINICAL DATA:   Fall, left hip pain EXAM: DG HIP (WITH OR WITHOUT PELVIS) 2-3V LEFT COMPARISON:  None Available. FINDINGS: There is an acute, mildly displaced, anatomically aligned intratrochanteric fracture of the left hip. Femoral head is still seated within the left acetabulum. Pelvis and right hip are intact. Joint spaces are preserved. Soft tissues are unremarkable. IMPRESSION: Acute, mildly displaced, anatomically aligned intratrochanteric fracture of the left hip. Electronically Signed   By: Helyn Numbers M.D.   On: 02/23/2022 00:49    Review of Systems  All other systems reviewed and are negative.  Blood pressure 106/62, pulse 63, temperature 97.6 F (36.4 C), temperature source Oral, resp. rate 15, height 5\' 5"  (1.651 m), weight 96.9 kg, SpO2 94 %. Physical Exam Musculoskeletal:        General: Tenderness (left hip), deformity and signs of injury present.  Skin:    General: Skin is warm and dry.     Capillary Refill: Capillary refill takes less than 2 seconds.  Neurological:     General: No focal deficit present.     Mental Status: She is oriented to person, place, and time.  Psychiatric:        Mood and Affect: Mood normal.        Behavior: Behavior normal.        Thought Content: Thought content normal.        Judgment: Judgment normal.     Assessment/Plan: Left hip intertrochanteric fracture: I spoke with the patient today.  I spoke with the team that was on-call yesterday as well.  They are waiting for response back from a surgeon who could possibly do her surgery this week.  Dr. is communicating with a surgeon for surgery this week.  I relayed all this information to the  patient this morning.  She is agreeable to the plan.  I told her somebody will come by tomorrow to talk with her.  All questions were encouraged and answered for the patient today.  Cherie Dark, PA-C [facility] 02/23/2022, 10:06 AM

## 2022-02-23 NOTE — H&P (View-Only) (Signed)
Reason for Consult: Left hip pain   Kara Acevedo is an 66 y.o. female.  HPI: This is a very pleasant 66 year old female who came in through the emergency department status post mechanical fall.  She states that she was getting out of her car yesterday night and she slipped and fell landed directly on that left hip.  She came into the emergency department complaining of significant left hip pain.  Dr. Shon Baton was consulted.  Patient states that she is having significant left-sided hip pain.  Having difficulty with any kind of movement.  She has had previous orthopedic surgery by a surgeon in [place].  Patient has difficulty with any kind of left hip movement.  Past Medical History:  Diagnosis Date   Arthritis    Hypertension     History reviewed. No pertinent surgical history.  Family History  Family history unknown: Yes    Social History:  reports that she has never smoked. She has never used smokeless tobacco. She reports that she does not drink alcohol and does not use drugs.  Allergies:  Allergies  Allergen Reactions   Doxycycline Nausea And Vomiting   Latex Other (See Comments)    Blisters    Sulfa Antibiotics Rash   Fluconazole Other (See Comments)    Feels like a burning sensation from inside out   Ace Inhibitors Cough    Medications: I have reviewed the patient's current medications.  Results for orders placed or performed during the hospital encounter of 02/22/22 (from the past 48 hour(s))  CBC     Status: None   Collection Time: 02/23/22 12:10 AM  Result Value Ref Range   WBC 10.4 4.0 - 10.5 K/uL   RBC 4.01 3.87 - 5.11 MIL/uL   Hemoglobin 12.6 12.0 - 15.0 g/dL   HCT 28.3 15.1 - 76.1 %   MCV 90.3 80.0 - 100.0 fL   MCH 31.4 26.0 - 34.0 pg   MCHC 34.8 30.0 - 36.0 g/dL   RDW 60.7 37.1 - 06.2 %   Platelets 280 150 - 400 K/uL   nRBC 0.0 0.0 - 0.2 %    Comment: Performed at [facility], [address] [place], [place] [address]   Comprehensive metabolic panel     Status: Abnormal   Collection Time: 02/23/22 12:10 AM  Result Value Ref Range   Sodium 138 135 - 145 mmol/L   Potassium 2.3 (LL) 3.5 - 5.1 mmol/L    Comment: CRITICAL RESULT CALLED TO, READ BACK BY AND VERIFIED WITH KADY, EMT.P @ 0115 ON 02/23/2022 BY LBROOKS, MLT    Chloride 104 98 - 111 mmol/L   CO2 27 22 - 32 mmol/L   Glucose, Bld 103 (H) 70 - 99 mg/dL    Comment: Glucose reference range applies only to samples taken after fasting for at least 8 hours.   BUN 20 8 - 23 mg/dL   Creatinine, Ser 4.62 (H) 0.44 - 1.00 mg/dL   Calcium 9.0 8.9 - 70.3 mg/dL   Total Protein 6.7 6.5 - 8.1 g/dL   Albumin 3.3 (L) 3.5 - 5.0 g/dL   AST 20 15 - 41 U/L   ALT 14 0 - 44 U/L   Alkaline Phosphatase 57 38 - 126 U/L   Total Bilirubin 0.9 0.3 - 1.2 mg/dL   GFR, Estimated 47 (L) >60 mL/min    Comment: (NOTE) Calculated using the CKD-EPI Creatinine Equation (2021)    Anion gap 7 5 - 15    Comment:  Performed at [facility], [address] [place], [place] [address]  Magnesium     Status: Abnormal   Collection Time: 02/23/22 12:10 AM  Result Value Ref Range   Magnesium 2.6 (H) 1.7 - 2.4 mg/dL    Comment: Performed at [facility], [address] [place], [place] [address]  SARS Coronavirus 2 by RT PCR (hospital order, performed in [facility] hospital lab) *cepheid single result test* Anterior Nasal Swab     Status: None   Collection Time: 02/23/22  1:11 AM   Specimen: Anterior Nasal Swab  Result Value Ref Range   SARS Coronavirus 2 by RT PCR NEGATIVE NEGATIVE    Comment: (NOTE) SARS-CoV-2 target nucleic acids are NOT DETECTED.  The SARS-CoV-2 RNA is generally detectable in upper and lower respiratory specimens during the acute phase of infection. The lowest concentration of SARS-CoV-2 viral copies this assay can detect is 250 copies / mL. A negative result does not preclude SARS-CoV-2 infection and should not be used as  the sole basis for treatment or other patient management decisions.  A negative result may occur with improper specimen collection / handling, submission of specimen other than nasopharyngeal swab, presence of viral mutation(s) within the areas targeted by this assay, and inadequate number of viral copies (<250 copies / mL). A negative result must be combined with clinical observations, patient history, and epidemiological information.  Fact Sheet for Patients:   RoadLapTop.co.za  Fact Sheet for Healthcare Providers: http://kim-miller.com/  This test is not yet approved or  cleared by the [place] FDA and has been authorized for detection and/or diagnosis of SARS-CoV-2 by FDA under an Emergency Use Authorization (EUA).  This EUA will remain in effect (meaning this test can be used) for the duration of the COVID-19 declaration under Section 564(b)(1) of the Act, 21 U.S.C. section 360bbb-3(b)(1), unless the authorization is terminated or revoked sooner.  Performed at [facility], [address], [place], [place] [address]   CBC     Status: Abnormal   Collection Time: 02/23/22  4:24 AM  Result Value Ref Range   WBC 10.8 (H) 4.0 - 10.5 K/uL   RBC 3.91 3.87 - 5.11 MIL/uL   Hemoglobin 12.3 12.0 - 15.0 g/dL   HCT 27.0 (L) 62.3 - 76.2 %   MCV 90.5 80.0 - 100.0 fL   MCH 31.5 26.0 - 34.0 pg   MCHC 34.7 30.0 - 36.0 g/dL   RDW 83.1 51.7 - 61.6 %   Platelets 243 150 - 400 K/uL   nRBC 0.0 0.0 - 0.2 %    Comment: Performed at [facility], [address] [place], [place] [address]  Basic metabolic panel     Status: Abnormal   Collection Time: 02/23/22  4:24 AM  Result Value Ref Range   Sodium 137 135 - 145 mmol/L   Potassium 2.4 (LL) 3.5 - 5.1 mmol/L    Comment: CRITICAL RESULT CALLED TO, READ BACK BY AND VERIFIED WITH VERONICA, RN @ [phone] ON 02/23/2022 BY LBROOKS, MLT    Chloride 102 98 - 111  mmol/L   CO2 26 22 - 32 mmol/L   Glucose, Bld 94 70 - 99 mg/dL    Comment: Glucose reference range applies only to samples taken after fasting for at least 8 hours.   BUN 18 8 - 23 mg/dL   Creatinine, Ser 9.48 0.44 - 1.00 mg/dL   Calcium 8.6 (L) 8.9 - 10.3 mg/dL  GFR, Estimated >60 >60 mL/min    Comment: (NOTE) Calculated using the CKD-EPI Creatinine Equation (2021)    Anion gap 9 5 - 15    Comment: Performed at [facility], [address], [place], [place] [address]  Troponin I (High Sensitivity)     Status: None   Collection Time: 02/23/22  4:24 AM  Result Value Ref Range   Troponin I (High Sensitivity) 4 <18 ng/L    Comment: (NOTE) Elevated high sensitivity troponin I (hsTnI) values and significant  changes across serial measurements may suggest ACS but many other  chronic and acute conditions are known to elevate hsTnI results.  Refer to the "Links" section for chest pain algorithms and additional  guidance. Performed at [facility], [address], [place], [place] [address]   Potassium     Status: Abnormal   Collection Time: 02/23/22  8:26 AM  Result Value Ref Range   Potassium 2.9 (L) 3.5 - 5.1 mmol/L    Comment: DELTA CHECK NOTED Performed at [facility], [address], [place], [place] [address]     DG Knee 1-2 Views Left  Result Date: 02/23/2022 CLINICAL DATA:  Fall, left hip pain EXAM: LEFT KNEE - 1-2 VIEW COMPARISON:  None Available. FINDINGS: Left total hip arthroplasty has been performed. Arthroplasty components are in anatomic alignment. Normal overall alignment. No acute fracture or dislocation. Small left knee effusion is present. Soft tissues are otherwise unremarkable. IMPRESSION: Small left knee effusion. No acute fracture or dislocation. Electronically Signed   By: Helyn Numbers M.D.   On: 02/23/2022 00:50   DG Hip Unilat W or Wo Pelvis 2-3 Views Left  Result Date: 02/23/2022 CLINICAL DATA:   Fall, left hip pain EXAM: DG HIP (WITH OR WITHOUT PELVIS) 2-3V LEFT COMPARISON:  None Available. FINDINGS: There is an acute, mildly displaced, anatomically aligned intratrochanteric fracture of the left hip. Femoral head is still seated within the left acetabulum. Pelvis and right hip are intact. Joint spaces are preserved. Soft tissues are unremarkable. IMPRESSION: Acute, mildly displaced, anatomically aligned intratrochanteric fracture of the left hip. Electronically Signed   By: Helyn Numbers M.D.   On: 02/23/2022 00:49    Review of Systems  All other systems reviewed and are negative.  Blood pressure 106/62, pulse 63, temperature 97.6 F (36.4 C), temperature source Oral, resp. rate 15, height 5\' 5"  (1.651 m), weight 96.9 kg, SpO2 94 %. Physical Exam Musculoskeletal:        General: Tenderness (left hip), deformity and signs of injury present.  Skin:    General: Skin is warm and dry.     Capillary Refill: Capillary refill takes less than 2 seconds.  Neurological:     General: No focal deficit present.     Mental Status: She is oriented to person, place, and time.  Psychiatric:        Mood and Affect: Mood normal.        Behavior: Behavior normal.        Thought Content: Thought content normal.        Judgment: Judgment normal.     Assessment/Plan: Left hip intertrochanteric fracture: I spoke with the patient today.  I spoke with the team that was on-call yesterday as well.  They are waiting for response back from a surgeon who could possibly do her surgery this week.  Dr. is communicating with a surgeon for surgery this week.  I relayed all this information to the  patient this morning.  She is agreeable to the plan.  I told her somebody will come by tomorrow to talk with her.  All questions were encouraged and answered for the patient today.  Cherie Dark, PA-C [facility] 02/23/2022, 10:06 AM

## 2022-02-23 NOTE — Assessment & Plan Note (Signed)
S/p Intramedullary fixation, Left femur on 7/17 by Dr. Veda Canning - WBAT with walker - Lovenox for DVT ppx while in house, aspirin 81 bid after discharge - PT eval

## 2022-02-23 NOTE — Progress Notes (Signed)
  Progress Note   Patient: Kara Acevedo DJM:[id number] DOB: September 10, 1955 DOA: 02/22/2022     0 DOS: the patient was seen and examined on 02/23/2022        Brief hospital course: [name] is a 66 y.o. F with HTN, ADHD who presented with hip fracture.   7/16: Admitted     Assessment and Plan: * Hip fracture (HCC) - COnsult ortho  Morbid obesity (HCC) BMI 35.5 with hypertension, osteoarthritis.  Rib pain - Obtain rib x-ray when able  Essential hypertension - Continue HCTZ-triamterene  ADHD - Hold adderall  Hypokalemia - Continue HCTZ-triamterene - Supplement K          Subjective: Hip hurts.     Physical Exam: Vitals:   02/23/22 0254 02/23/22 0300 02/23/22 0656 02/23/22 1111  BP:  126/72 106/62 100/64  Pulse:  71 63 (!) 55  Resp:  16 15 16   Temp:  97.6 F (36.4 C) 97.6 F (36.4 C) 97.7 F (36.5 C)  TempSrc:  Oral Oral Oral  SpO2:  100% 94% 100%  Weight: 96.9 kg     Height: 5\' 5"  (1.651 m)      Patient seen and examined.  Oriented and appropriate.  Neuro nonfocal.  Medical decision making: This is a no charge note, for further details, please see H&P by Dr. from earlier today.  Family Communication: Daughter at bedside    Disposition: Status is: Inpatient         Author: , MD 02/23/2022 2:14 PM  For on call review [email or web address].
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
## 2022-02-24 ENCOUNTER — Inpatient Hospital Stay (HOSPITAL_COMMUNITY): Payer: BC Managed Care – PPO

## 2022-02-24 ENCOUNTER — Inpatient Hospital Stay (HOSPITAL_COMMUNITY): Payer: BC Managed Care – PPO | Admitting: Anesthesiology

## 2022-02-24 ENCOUNTER — Encounter (HOSPITAL_COMMUNITY): Payer: Self-pay | Admitting: Internal Medicine

## 2022-02-24 ENCOUNTER — Encounter (HOSPITAL_COMMUNITY)
Admission: EM | Disposition: A | Discharge status: Home Health | Payer: Self-pay | Source: Home / Self Care | Attending: Family Medicine

## 2022-02-24 DIAGNOSIS — F909 Attention-deficit hyperactivity disorder, unspecified type: Secondary | ICD-10-CM | POA: Diagnosis not present

## 2022-02-24 DIAGNOSIS — I10 Essential (primary) hypertension: Secondary | ICD-10-CM | POA: Diagnosis not present

## 2022-02-24 DIAGNOSIS — S72002A Fracture of unspecified part of neck of left femur, initial encounter for closed fracture: Secondary | ICD-10-CM | POA: Diagnosis not present

## 2022-02-24 DIAGNOSIS — E876 Hypokalemia: Secondary | ICD-10-CM | POA: Diagnosis not present

## 2022-02-24 HISTORY — PX: INTRAMEDULLARY (IM) NAIL INTERTROCHANTERIC: SHX5875

## 2022-02-24 LAB — RENAL FUNCTION PANEL
Albumin: 2.9 g/dL — ABNORMAL LOW (ref 3.5–5.0)
Anion gap: 6 (ref 5–15)
BUN: 9 mg/dL (ref 8–23)
CO2: 28 mmol/L (ref 22–32)
Calcium: 8.7 mg/dL — ABNORMAL LOW (ref 8.9–10.3)
Chloride: 103 mmol/L (ref 98–111)
Creatinine, Ser: 0.85 mg/dL (ref 0.44–1.00)
GFR, Estimated: >60 mL/min (ref >60)
Glucose, Bld: 107 mg/dL — ABNORMAL HIGH (ref 70–99)
Phosphorus: 3 mg/dL (ref 2.5–4.6)
Potassium: 3.5 mmol/L (ref 3.5–5.1)
Sodium: 137 mmol/L (ref 135–145)

## 2022-02-24 LAB — SURGICAL PCR SCREEN
MRSA, PCR: NEGATIVE
Staphylococcus aureus: NEGATIVE

## 2022-02-24 SURGERY — FIXATION, FRACTURE, INTERTROCHANTERIC, WITH INTRAMEDULLARY ROD
Anesthesia: Monitor Anesthesia Care | Site: Hip | Laterality: Left

## 2022-02-24 MED ORDER — FENTANYL CITRATE PF 50 MCG/ML IJ SOSY: 25.0000 ug | PREFILLED_SYRINGE | INTRAMUSCULAR | Status: DC | PRN

## 2022-02-24 MED ORDER — MIDAZOLAM HCL 2 MG/2ML IJ SOLN: 1.0000 mg | Freq: Once | INTRAMUSCULAR | Status: AC

## 2022-02-24 MED ORDER — EPHEDRINE 5 MG/ML INJ
INTRAVENOUS | Status: AC
  Filled 2022-02-24: qty 5

## 2022-02-24 MED ORDER — BUPIVACAINE IN DEXTROSE 0.75-8.25 % IT SOLN
INTRATHECAL | Status: DC | PRN
  Administered 2022-02-24: 1.8 mL via INTRATHECAL

## 2022-02-24 MED ORDER — DEXAMETHASONE SODIUM PHOSPHATE 10 MG/ML IJ SOLN
INTRAMUSCULAR | Status: DC | PRN
  Administered 2022-02-24: 5 mg via INTRAVENOUS

## 2022-02-24 MED ORDER — TRANEXAMIC ACID-NACL 1000-0.7 MG/100ML-% IV SOLN
INTRAVENOUS | Status: AC
  Filled 2022-02-24: qty 100

## 2022-02-24 MED ORDER — ACETAMINOPHEN 500 MG PO TABS
ORAL_TABLET | ORAL | Status: AC
  Filled 2022-02-24: qty 2

## 2022-02-24 MED ORDER — ALBUMIN HUMAN 5 % IV SOLN
12.5000 g | Freq: Once | INTRAVENOUS | Status: AC
  Administered 2022-02-24: 12.5 g via INTRAVENOUS

## 2022-02-24 MED ORDER — TRANEXAMIC ACID-NACL 1000-0.7 MG/100ML-% IV SOLN
1000.0000 mg | INTRAVENOUS | Status: AC
  Administered 2022-02-24: 1000 mg via INTRAVENOUS

## 2022-02-24 MED ORDER — FENTANYL CITRATE PF 50 MCG/ML IJ SOSY
50.0000 ug | PREFILLED_SYRINGE | Freq: Once | INTRAMUSCULAR | Status: AC
  Administered 2022-02-24: 100 ug via INTRAVENOUS

## 2022-02-24 MED ORDER — FENTANYL CITRATE PF 50 MCG/ML IJ SOSY
PREFILLED_SYRINGE | INTRAMUSCULAR | Status: AC
  Administered 2022-02-24: 50 ug via INTRAVENOUS
  Filled 2022-02-24: qty 2

## 2022-02-24 MED ORDER — CEFAZOLIN SODIUM-DEXTROSE 2-4 GM/100ML-% IV SOLN
2.0000 g | INTRAVENOUS | Status: AC
  Administered 2022-02-24: 2 g via INTRAVENOUS

## 2022-02-24 MED ORDER — FENTANYL CITRATE (PF) 100 MCG/2ML IJ SOLN
INTRAMUSCULAR | Status: DC | PRN
  Administered 2022-02-24 (×2): 50 ug via INTRAVENOUS

## 2022-02-24 MED ORDER — ENOXAPARIN SODIUM 40 MG/0.4ML IJ SOSY
40.0000 mg | PREFILLED_SYRINGE | INTRAMUSCULAR | Status: DC
  Administered 2022-02-25 – 2022-02-28 (×4): 40 mg via SUBCUTANEOUS
  Filled 2022-02-24 (×4): qty 0.4

## 2022-02-24 MED ORDER — HYDROCODONE-ACETAMINOPHEN 7.5-325 MG PO TABS
1.0000 | ORAL_TABLET | ORAL | Status: DC | PRN
  Administered 2022-02-25 – 2022-02-28 (×12): 2 via ORAL
  Filled 2022-02-24 (×8): qty 2
  Filled 2022-02-24: qty 1
  Filled 2022-02-24 (×4): qty 2

## 2022-02-24 MED ORDER — ADULT MULTIVITAMIN W/MINERALS CH
1.0000 | ORAL_TABLET | Freq: Every day | ORAL | Status: DC
  Administered 2022-02-25 – 2022-02-28 (×4): 1 via ORAL
  Filled 2022-02-24 (×4): qty 1

## 2022-02-24 MED ORDER — ACETAMINOPHEN 325 MG PO TABS: 325.0000 mg | ORAL_TABLET | Freq: Four times a day (QID) | ORAL | Status: DC | PRN

## 2022-02-24 MED ORDER — DOCUSATE SODIUM 100 MG PO CAPS
100.0000 mg | ORAL_CAPSULE | Freq: Two times a day (BID) | ORAL | Status: DC
  Administered 2022-02-25 – 2022-02-28 (×6): 100 mg via ORAL
  Filled 2022-02-24 (×6): qty 1

## 2022-02-24 MED ORDER — ONDANSETRON HCL 4 MG PO TABS: 4.0000 mg | ORAL_TABLET | Freq: Four times a day (QID) | ORAL | Status: DC | PRN

## 2022-02-24 MED ORDER — CHLORHEXIDINE GLUCONATE 0.12 % MT SOLN
15.0000 mL | Freq: Once | OROMUCOSAL | Status: AC
  Administered 2022-02-24: 15 mL via OROMUCOSAL

## 2022-02-24 MED ORDER — PROPOFOL 10 MG/ML IV BOLUS
INTRAVENOUS | Status: DC | PRN
  Administered 2022-02-24 (×3): 20 mg via INTRAVENOUS

## 2022-02-24 MED ORDER — POVIDONE-IODINE 10 % EX SWAB
2.0000 | Freq: Once | CUTANEOUS | Status: AC
  Administered 2022-02-24: 2 via TOPICAL

## 2022-02-24 MED ORDER — LIDOCAINE 2% (20 MG/ML) 5 ML SYRINGE
INTRAMUSCULAR | Status: DC | PRN
  Administered 2022-02-24: 40 mg via INTRAVENOUS

## 2022-02-24 MED ORDER — MIDAZOLAM HCL 2 MG/2ML IJ SOLN
INTRAMUSCULAR | Status: DC | PRN
  Administered 2022-02-24 (×2): 1 mg via INTRAVENOUS

## 2022-02-24 MED ORDER — METHOCARBAMOL 1000 MG/10ML IJ SOLN: 500.0000 mg | Freq: Four times a day (QID) | INTRAVENOUS | Status: DC | PRN

## 2022-02-24 MED ORDER — METHOCARBAMOL 500 MG PO TABS
500.0000 mg | ORAL_TABLET | Freq: Four times a day (QID) | ORAL | Status: DC | PRN
  Administered 2022-02-25 – 2022-02-28 (×7): 500 mg via ORAL
  Filled 2022-02-24 (×7): qty 1

## 2022-02-24 MED ORDER — SODIUM CHLORIDE 0.9 % IR SOLN
Status: DC | PRN
  Administered 2022-02-24: 1000 mL

## 2022-02-24 MED ORDER — ONDANSETRON HCL 4 MG/2ML IJ SOLN
INTRAMUSCULAR | Status: AC
  Filled 2022-02-24: qty 2

## 2022-02-24 MED ORDER — ONDANSETRON HCL 4 MG/2ML IJ SOLN
INTRAMUSCULAR | Status: DC | PRN
  Administered 2022-02-24: 4 mg via INTRAVENOUS

## 2022-02-24 MED ORDER — PROPOFOL 1000 MG/100ML IV EMUL
INTRAVENOUS | Status: AC
  Filled 2022-02-24: qty 200

## 2022-02-24 MED ORDER — CHLORHEXIDINE GLUCONATE CLOTH 2 % EX PADS: MEDICATED_PAD | Freq: Once | CUTANEOUS | Status: AC

## 2022-02-24 MED ORDER — CEFAZOLIN SODIUM-DEXTROSE 2-4 GM/100ML-% IV SOLN
2.0000 g | Freq: Four times a day (QID) | INTRAVENOUS | Status: AC
  Administered 2022-02-25 (×2): 2 g via INTRAVENOUS
  Filled 2022-02-24 (×2): qty 100

## 2022-02-24 MED ORDER — MUPIROCIN 2 % EX OINT
1.0000 | TOPICAL_OINTMENT | Freq: Two times a day (BID) | CUTANEOUS | Status: DC
  Administered 2022-02-24 – 2022-02-25 (×2): 1 via NASAL
  Filled 2022-02-24 (×2): qty 22

## 2022-02-24 MED ORDER — ACETAMINOPHEN 500 MG PO TABS
1000.0000 mg | ORAL_TABLET | Freq: Once | ORAL | Status: AC
  Administered 2022-02-24: 1000 mg via ORAL

## 2022-02-24 MED ORDER — PROPOFOL 10 MG/ML IV BOLUS
INTRAVENOUS | Status: AC
  Filled 2022-02-24: qty 20

## 2022-02-24 MED ORDER — SENNA 8.6 MG PO TABS
1.0000 | ORAL_TABLET | Freq: Two times a day (BID) | ORAL | Status: DC
  Administered 2022-02-25 – 2022-02-28 (×5): 8.6 mg via ORAL
  Filled 2022-02-24 (×6): qty 1

## 2022-02-24 MED ORDER — PHENOL 1.4 % MT LIQD
1.0000 | OROMUCOSAL | Status: DC | PRN
Start: 2022-02-24 — End: 2022-02-28

## 2022-02-24 MED ORDER — PHENYLEPHRINE 80 MCG/ML (10ML) SYRINGE FOR IV PUSH (FOR BLOOD PRESSURE SUPPORT)
PREFILLED_SYRINGE | INTRAVENOUS | Status: AC
  Filled 2022-02-24: qty 10

## 2022-02-24 MED ORDER — HYDROCODONE-ACETAMINOPHEN 5-325 MG PO TABS
1.0000 | ORAL_TABLET | ORAL | Status: DC | PRN
  Administered 2022-02-25 – 2022-02-27 (×3): 2 via ORAL
  Filled 2022-02-24 (×4): qty 2

## 2022-02-24 MED ORDER — ALBUMIN HUMAN 5 % IV SOLN
INTRAVENOUS | Status: AC
  Filled 2022-02-24: qty 250

## 2022-02-24 MED ORDER — MORPHINE SULFATE (PF) 2 MG/ML IV SOLN: 0.5000 mg | INTRAVENOUS | Status: DC | PRN

## 2022-02-24 MED ORDER — ISOPROPYL ALCOHOL 70 % SOLN: Status: DC | PRN

## 2022-02-24 MED ORDER — MIDAZOLAM HCL 2 MG/2ML IJ SOLN
INTRAMUSCULAR | Status: AC
  Filled 2022-02-24: qty 2

## 2022-02-24 MED ORDER — MIDAZOLAM HCL 2 MG/2ML IJ SOLN
INTRAMUSCULAR | Status: AC
  Administered 2022-02-24: 2 mg via INTRAVENOUS
  Filled 2022-02-24: qty 2

## 2022-02-24 MED ORDER — ONDANSETRON HCL 4 MG/2ML IJ SOLN: 4.0000 mg | Freq: Four times a day (QID) | INTRAMUSCULAR | Status: DC | PRN

## 2022-02-24 MED ORDER — MENTHOL 3 MG MT LOZG: 1.0000 | LOZENGE | OROMUCOSAL | Status: DC | PRN

## 2022-02-24 MED ORDER — TRANEXAMIC ACID-NACL 1000-0.7 MG/100ML-% IV SOLN
1000.0000 mg | Freq: Once | INTRAVENOUS | Status: AC
  Administered 2022-02-25: 1000 mg via INTRAVENOUS
  Filled 2022-02-24: qty 100

## 2022-02-24 MED ORDER — PHENYLEPHRINE 80 MCG/ML (10ML) SYRINGE FOR IV PUSH (FOR BLOOD PRESSURE SUPPORT)
PREFILLED_SYRINGE | INTRAVENOUS | Status: DC | PRN
  Administered 2022-02-24: 120 ug via INTRAVENOUS

## 2022-02-24 MED ORDER — FENTANYL CITRATE (PF) 100 MCG/2ML IJ SOLN
INTRAMUSCULAR | Status: AC
  Filled 2022-02-24: qty 2

## 2022-02-24 MED ORDER — PHENYLEPHRINE HCL-NACL 20-0.9 MG/250ML-% IV SOLN
INTRAVENOUS | Status: DC | PRN
  Administered 2022-02-24: 25 ug/min via INTRAVENOUS

## 2022-02-24 MED ORDER — STERILE WATER FOR IRRIGATION IR SOLN
Status: DC | PRN
  Administered 2022-02-24: 2000 mL

## 2022-02-24 MED ORDER — DEXAMETHASONE SODIUM PHOSPHATE 10 MG/ML IJ SOLN
INTRAMUSCULAR | Status: AC
  Filled 2022-02-24: qty 1

## 2022-02-24 MED ORDER — PROMETHAZINE HCL 25 MG/ML IJ SOLN: 6.2500 mg | INTRAMUSCULAR | Status: DC | PRN

## 2022-02-24 MED ORDER — ENSURE ENLIVE PO LIQD
237.0000 mL | Freq: Two times a day (BID) | ORAL | Status: DC
Start: 2022-02-25 — End: 2022-02-28
  Administered 2022-02-25 – 2022-02-28 (×6): 237 mL via ORAL

## 2022-02-24 MED ORDER — LACTATED RINGERS IV SOLN: INTRAVENOUS | Status: DC

## 2022-02-24 MED ORDER — METOCLOPRAMIDE HCL 5 MG/ML IJ SOLN: 5.0000 mg | Freq: Three times a day (TID) | INTRAMUSCULAR | Status: DC | PRN

## 2022-02-24 MED ORDER — CEFAZOLIN SODIUM-DEXTROSE 2-4 GM/100ML-% IV SOLN
INTRAVENOUS | Status: AC
  Filled 2022-02-24: qty 100

## 2022-02-24 MED ORDER — ALBUMIN HUMAN 5 % IV SOLN: INTRAVENOUS | Status: DC | PRN

## 2022-02-24 MED ORDER — FENTANYL CITRATE PF 50 MCG/ML IJ SOSY: 50.0000 ug | PREFILLED_SYRINGE | Freq: Once | INTRAMUSCULAR | Status: AC

## 2022-02-24 MED ORDER — ALBUMIN HUMAN 5 % IV SOLN
INTRAVENOUS | Status: AC
Start: 2022-02-24 — End: ?
  Filled 2022-02-24: qty 250

## 2022-02-24 MED ORDER — POTASSIUM CHLORIDE IN NACL 40-0.9 MEQ/L-% IV SOLN
INTRAVENOUS | Status: DC
  Filled 2022-02-24 (×2): qty 1000

## 2022-02-24 MED ORDER — LIDOCAINE HCL (PF) 2 % IJ SOLN
INTRAMUSCULAR | Status: AC
  Filled 2022-02-24: qty 5

## 2022-02-24 MED ORDER — METOCLOPRAMIDE HCL 5 MG PO TABS: 5.0000 mg | ORAL_TABLET | Freq: Three times a day (TID) | ORAL | Status: DC | PRN

## 2022-02-24 MED ORDER — ORAL CARE MOUTH RINSE: 15.0000 mL | Freq: Once | OROMUCOSAL | Status: AC

## 2022-02-24 MED ORDER — FENTANYL CITRATE PF 50 MCG/ML IJ SOSY
PREFILLED_SYRINGE | INTRAMUSCULAR | Status: AC
  Filled 2022-02-24: qty 2

## 2022-02-24 MED ORDER — AMISULPRIDE (ANTIEMETIC) 5 MG/2ML IV SOLN: 10.0000 mg | Freq: Once | INTRAVENOUS | Status: DC | PRN

## 2022-02-24 MED ORDER — PROPOFOL 500 MG/50ML IV EMUL
INTRAVENOUS | Status: DC | PRN
  Administered 2022-02-24: 75 ug/kg/min via INTRAVENOUS

## 2022-02-24 SURGICAL SUPPLY — 55 items
BAG ZIPLOCK 12X15 (MISCELLANEOUS) ×1 IMPLANT
BIT DRILL CANN LG 4.3MM (BIT) IMPLANT
CHLORAPREP W/TINT 26 (MISCELLANEOUS) ×2 IMPLANT
COVER PERINEAL POST (MISCELLANEOUS) ×2 IMPLANT
COVER SURGICAL LIGHT HANDLE (MISCELLANEOUS) ×2 IMPLANT
DERMABOND ADVANCED (GAUZE/BANDAGES/DRESSINGS) ×2
DERMABOND ADVANCED .7 DNX12 (GAUZE/BANDAGES/DRESSINGS) ×2 IMPLANT
DRAPE C-ARM 42X120 X-RAY (DRAPES) ×2 IMPLANT
DRAPE C-ARMOR (DRAPES) ×2 IMPLANT
DRAPE IMP U-DRAPE 54X76 (DRAPES) ×4 IMPLANT
DRAPE SHEET LG 3/4 BI-LAMINATE (DRAPES) ×4 IMPLANT
DRAPE STERI IOBAN 125X83 (DRAPES) ×2 IMPLANT
DRAPE U-SHAPE 47X51 STRL (DRAPES) ×4 IMPLANT
DRESSING MEPILEX FLEX 4X4 (GAUZE/BANDAGES/DRESSINGS) ×2 IMPLANT
DRILL BIT CANN LG 4.3MM (BIT) ×2
DRSG MEPILEX BORDER 4X8 (GAUZE/BANDAGES/DRESSINGS) IMPLANT
DRSG MEPILEX FLEX 4X4 (GAUZE/BANDAGES/DRESSINGS) ×6
ELECT BLADE TIP CTD 4 INCH (ELECTRODE) IMPLANT
FACESHIELD WRAPAROUND (MASK) ×4 IMPLANT
FACESHIELD WRAPAROUND OR TEAM (MASK) ×2 IMPLANT
GAUZE SPONGE 4X4 12PLY STRL (GAUZE/BANDAGES/DRESSINGS) ×2 IMPLANT
GLOVE BIOGEL PI IND STRL 7.5 (GLOVE) ×1 IMPLANT
GLOVE BIOGEL PI IND STRL 8 (GLOVE) ×1 IMPLANT
GLOVE BIOGEL PI IND STRL 8.5 (GLOVE) ×1 IMPLANT
GLOVE BIOGEL PI INDICATOR 7.5 (GLOVE) ×2
GLOVE BIOGEL PI INDICATOR 8 (GLOVE) ×2
GLOVE BIOGEL PI INDICATOR 8.5 (GLOVE) ×2
GLOVE SURG LX 7.5 STRW (GLOVE) ×3
GLOVE SURG LX STRL 7.5 STRW (GLOVE) ×2 IMPLANT
GLOVE SURG ORTHO 7.0 STRL STRW (GLOVE) ×1 IMPLANT
GLOVE SURG ORTHO 8.0 STRL STRW (GLOVE) ×2 IMPLANT
GLOVE SURG ORTHO 9.0 STRL STRW (GLOVE) ×1 IMPLANT
GOWN SPEC L3 XXLG W/TWL (GOWN DISPOSABLE) ×2 IMPLANT
GOWN STRL REUS W/ TWL LRG LVL3 (GOWN DISPOSABLE) IMPLANT
GOWN STRL REUS W/ TWL XL LVL3 (GOWN DISPOSABLE) IMPLANT
GOWN STRL REUS W/TWL LRG LVL3 (GOWN DISPOSABLE) ×1
GOWN STRL REUS W/TWL XL LVL3 (GOWN DISPOSABLE) ×2
GUIDEPIN VERSANAIL DSP 3.2X444 (ORTHOPEDIC DISPOSABLE SUPPLIES) ×2 IMPLANT
HFN 125 DEG 11MM X 180MM (Orthopedic Implant) ×1 IMPLANT
HIP FRAC NAIL LAG SCR 10.5X100 (Orthopedic Implant) ×1 IMPLANT
HOOD PEEL AWAY FLYTE STAYCOOL (MISCELLANEOUS) ×2 IMPLANT
KIT BASIN OR (CUSTOM PROCEDURE TRAY) ×2 IMPLANT
MANIFOLD NEPTUNE II (INSTRUMENTS) ×2 IMPLANT
MARKER SKIN DUAL TIP RULER LAB (MISCELLANEOUS) ×2 IMPLANT
PACK GENERAL/GYN (CUSTOM PROCEDURE TRAY) ×2 IMPLANT
SCREW BONE CORTICAL 5.0X40 (Screw) ×1 IMPLANT
SCREW CANN THRD AFF 10.5X100 (Orthopedic Implant) IMPLANT
SUT MNCRL AB 3-0 PS2 18 (SUTURE) ×2 IMPLANT
SUT MON AB 2-0 SH 27 (SUTURE) ×2
SUT MON AB 2-0 SH27 (SUTURE) IMPLANT
SUT VIC AB 1 CT1 36 (SUTURE) ×3 IMPLANT
SUT VIC AB 2-0 CT2 27 (SUTURE) ×1 IMPLANT
TOWEL OR 17X26 10 PK STRL BLUE (TOWEL DISPOSABLE) ×2 IMPLANT
TRAY FOL W/BAG SLVR 16FR STRL (SET/KITS/TRAYS/PACK) IMPLANT
TRAY FOLEY W/BAG SLVR 16FR LF (SET/KITS/TRAYS/PACK) ×1

## 2022-02-24 NOTE — Progress Notes (Signed)
Asked by Dr. Shon Baton to take over care for surgery. Plan for IM nail left femur this afternoon/evening. NPO, hold chemical DVT ppx.

## 2022-02-24 NOTE — Anesthesia Preprocedure Evaluation (Addendum)
Anesthesia Evaluation  Patient identified by MRN, date of birth, ID band Patient awake    Reviewed: Allergy & Precautions, NPO status , Patient's Chart, lab work & pertinent test results  History of Anesthesia Complications Negative for: history of anesthetic complications  Airway Mallampati: II  TM Distance: >3 FB Neck ROM: Full    Dental  (+) Dental Advisory Given, Edentulous Upper, Partial Lower   Pulmonary neg pulmonary ROS,    Pulmonary exam normal        Cardiovascular hypertension, Pt. on medications Normal cardiovascular exam     Neuro/Psych negative neurological ROS     GI/Hepatic negative GI ROS, Neg liver ROS,   Endo/Other  negative endocrine ROS  Renal/GU negative Renal ROS     Musculoskeletal negative musculoskeletal ROS (+)   Abdominal   Peds  Hematology negative hematology ROS (+)   Anesthesia Other Findings   Reproductive/Obstetrics                            Anesthesia Physical Anesthesia Plan  ASA: 2  Anesthesia Plan: Spinal and MAC   Post-op Pain Management: Celebrex PO (pre-op)* and Tylenol PO (pre-op)*   Induction:   PONV Risk Score and Plan: 2 and Propofol infusion, Midazolam and Ondansetron  Airway Management Planned: Natural Airway and Simple Face Mask  Additional Equipment:   Intra-op Plan:   Post-operative Plan:   Informed Consent: I have reviewed the patients History and Physical, chart, labs and discussed the procedure including the risks, benefits and alternatives for the proposed anesthesia with the patient or authorized representative who has indicated his/her understanding and acceptance.     Dental advisory given  Plan Discussed with: Anesthesiologist and CRNA  Anesthesia Plan Comments:        Anesthesia Quick Evaluation

## 2022-02-24 NOTE — Anesthesia Pain Management Evaluation Note (Signed)
  Anesthesia Pain Consult Note  Patient: Kara Acevedo, 66 y.o., female  Consult Requested by: Alberteen Sam, *  Reason for Consult: pain  Level of Consciousness: alert  Pain: moderate   Last Vitals:  Vitals:   02/24/22 0945 02/24/22 1001  BP: (!) 101/57 (!) 92/59  Pulse: 67 69  Resp: 16 18  Temp:  37.6 C  SpO2: 94% 98%    Plan: Peripheral nerve block for pain control  Risks of wet tap, epidural hematoma and spinal cord injury explained to:   Consent:Risks of procedure as well as the alternatives and risks of each were explained to the (patient/caregiver).  Consent for procedure obtained.  Advance Directive:Patient not able to name a surrogate decision maker or provide an advance care plan.   Allergies  Allergen Reactions  . Doxycycline Nausea And Vomiting  . Latex Other (See Comments)    Blisters   . Sulfa Antibiotics Rash  . Fluconazole Other (See Comments)    Feels like a burning sensation from inside out  . Ace Inhibitors Cough    Physical exam: PULM clear to auscultation  CARDIO Heart regular rate and rhythm  OTHER    I have reviewed the patient's medications listed below. . mupirocin ointment  1 Application Nasal BID    hydrALAZINE, HYDROmorphone (DILAUDID) injection, ondansetron (ZOFRAN) IV, mouth rinse  Past Medical History:  Diagnosis Date  . Arthritis   . Hypertension    History reviewed. No pertinent surgical history.  reports that she has never smoked. She has never used smokeless tobacco. She reports that she does not drink alcohol and does not use drugs.    Kara Acevedo 02/24/2022

## 2022-02-24 NOTE — Anesthesia Procedure Notes (Signed)
Anesthesia Regional Block: Peng block   Pre-Anesthetic Checklist: , timeout performed,  Correct Patient, Correct Site, Correct Laterality,  Correct Procedure, Correct Position, site marked,  Risks and benefits discussed,  Surgical consent,  Pre-op evaluation,  At surgeon's request and post-op pain management  Laterality: Left  Prep: chloraprep       Needles:  Injection technique: Single-shot  Needle Type: Echogenic Stimulator Needle     Needle Length: 5cm  Needle Gauge: 22     Additional Needles:   Procedures:, nerve stimulator,,, ultrasound used (permanent image in chart),,    Narrative:  Start time: 02/24/2022 9:50 AM End time: 02/24/2022 7:56 PM Injection made incrementally with aspirations every 5 mL.  Performed by: Personally  Anesthesiologist: Bethena Midget, MD  Additional Notes: Functioning IV was confirmed and monitors were applied.  A 29mm 22ga Arrow echogenic stimulator needle was used. Sterile prep and drape,hand hygiene and sterile gloves were used. Ultrasound guidance: relevant anatomy identified, needle position confirmed, local anesthetic spread visualized around nerve(s)., vascular puncture avoided.  Image printed for medical record. Negative aspiration and negative test dose prior to incremental administration of local anesthetic. The patient tolerated the procedure well.

## 2022-02-24 NOTE — Interval H&P Note (Signed)
History and Physical Interval Note:  02/24/2022 6:34 PM  Kara Acevedo  has presented today for surgery, with the diagnosis of LEFT INTERTROCHANTERIC FRACTURE.  The various methods of treatment have been discussed with the patient and family. After consideration of risks, benefits and other options for treatment, the patient has consented to  Procedure(s): INTRAMEDULLARY (IM) NAIL INTERTROCHANTRIC (Left) as a surgical intervention.  The patient's history has been reviewed, patient examined, no change in status, stable for surgery.  I have reviewed the patient's chart and labs.  Questions were answered to the patient's satisfaction.    The risks, benefits, and alternatives were discussed with the patient. There are risks associated with the surgery including, but not limited to, problems with anesthesia (death), infection, differences in leg length/angulation/rotation, fracture of bones, loosening or failure of implants, malunion, nonunion, hematoma (blood accumulation) which may require surgical drainage, blood clots, pulmonary embolism, nerve injury (foot drop), and blood vessel injury. The patient understands these risks and elects to proceed.    Iline Oven Niara Bunker
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
## 2022-02-24 NOTE — Transfer of Care (Signed)
Immediate Anesthesia Transfer of Care Note  Patient: Kara Acevedo  Procedure(s) Performed: INTRAMEDULLARY (IM) NAIL INTERTROCHANTRIC (Left: Hip)  Patient Location: PACU  Anesthesia Type:Spinal  Level of Consciousness: awake and alert   Airway & Oxygen Therapy: Patient Spontanous Breathing and Patient connected to face mask oxygen  Post-op Assessment: Report given to RN and Post -op Vital signs reviewed and stable  Post vital signs: Reviewed and stable  Last Vitals:  Vitals Value Taken Time  BP 83/53 02/24/22 2131  Temp    Pulse 70 02/24/22 2133  Resp 16 02/24/22 2133  SpO2 91 % 02/24/22 2133  Vitals shown include unvalidated device data.  Last Pain:  Vitals:   02/24/22 1820  TempSrc:   PainSc: 4       Patients Stated Pain Goal: 2 (02/23/22 2000)  Complications: No notable events documented.

## 2022-02-24 NOTE — Op Note (Signed)
OPERATIVE REPORT  SURGEON: Rod Can, MD   ASSISTANT: Larene Pickett, PA-C.  PREOPERATIVE DIAGNOSIS: Left intertrochanteric femur fracture.   POSTOPERATIVE DIAGNOSIS: Left intertrochanteric femur fracture.   PROCEDURE: Intramedullary fixation, Left femur.   IMPLANTS: Biomet Affixus Hip Fracture Nail, 11 by 180 mm, 125 degrees. 10.5 x 100 mm Hip Fracture Nail Lag Screw. 5 x 40 mm distal interlocking screw 1.  ANESTHESIA:  MAC and Spinal  ESTIMATED BLOOD LOSS:-100 mL    ANTIBIOTICS:  2 g Ancef.  DRAINS: None.  COMPLICATIONS: None.   CONDITION: PACU - hemodynamically stable.Marland Kitchen   BRIEF CLINICAL NOTE: Kara Acevedo is a 66 y.o. female who presented with an intertrochanteric femur fracture. The patient was admitted to the hospitalist service and underwent perioperative risk stratification and medical optimization. The risks, benefits, and alternatives to the procedure were explained, and the patient elected to proceed.  PROCEDURE IN DETAIL: Surgical site was marked by myself. The patient was taken to the operating room and anesthesia was induced on the bed. The patient was then transferred to the [facility] table and the nonoperative lower extremity was scissored underneath the operative side. The fracture was reduced with traction, internal rotation, and adduction. The hip was prepped and draped in the normal sterile surgical fashion. Timeout was called verifying side and site of surgery. Preop antibiotics were given with 60 minutes of beginning the procedure.  Fluoroscopy was used to define the patient's anatomy. A 4 cm incision was made just proximal to the tip of the greater trochanter. The awl was used to obtain the standard starting point for a trochanteric entry nail under fluoroscopic control. The guidepin was placed. The entry reamer was used to open the proximal femur.  On the back table, the nail was assembled onto the jig. The nail was placed into the femur without  any difficulty. Through a separate stab incision, the cannula was placed down to the bone in preparation for the cephalomedullary device. A guidepin was placed into the femoral head using AP and lateral fluoroscopy views. The pin was measured, and then reaming was performed to the appropriate depth. The lag screw was inserted to the appropriate depth. The fracture was compressed through the jig. The setscrew was tightened and then loosened one quarter turn. A separate stab incision was created, and the distal interlocking screw was placed using standard AO technique. The jig was removed. Final AP and lateral fluoroscopy views were obtained to confirm fracture reduction and hardware placement. Tip apex distance was appropriate. There was no chondral penetration.  The wounds were copiously irrigated with saline. The wound was closed in layers with #1 Vicryl for the fascia, 2-0 Monocryl for the deep dermal layer, and staples for skin. Dermabond was applied to the skin. Once the glue was fully hardened, sterile dressing was applied. The patient was then awakened from anesthesia and taken to the PACU in stable condition. Sponge needle and instrument counts were correct at the end of the case 2. There were no known complications.  We will readmit the patient to the hospitalist. Weightbearing status will be weightbearing as tolerated with a walker. We will begin Lovenox for DVT prophylaxis. The patient will work with physical therapy and undergo disposition planning.  Please note that a surgical assistant was a medical necessity for this procedure to perform it in a safe and expeditious manner. Assistant was necessary to provide appropriate retraction of vital neurovascular structures, to prevent femoral fracture, and to allow for anatomic placement of the prosthesis.

## 2022-02-24 NOTE — Discharge Instructions (Signed)
 Dr. Mostyn Varnell Adult Hip & Knee Specialist [facility] [address] [place], [place] [address] [phone]   POSTOPERATIVE DIRECTIONS    Hip Rehabilitation, Guidelines Following Surgery   WEIGHT BEARING Weight bearing as tolerated with assist device (walker, cane, etc) as directed, use it as long as suggested by your surgeon or therapist, typically at least 4-6 weeks.   HOME CARE INSTRUCTIONS  Remove items at home which could result in a fall. This includes throw rugs or furniture in walking pathways.  Continue medications as instructed at time of discharge.  You may have some home medications which will be placed on hold until you complete the course of blood thinner medication.  4 days after discharge, you may start showering. No tub baths or soaking your incisions. Do not put on socks or shoes without following the instructions of your caregivers.   Sit on chairs with arms. Use the chair arms to help push yourself up when arising.  Arrange for the use of a toilet seat elevator so you are not sitting low.   Walk with walker as instructed.  You may resume a sexual relationship in one month or when given the OK by your caregiver.  Use walker as long as suggested by your caregivers.  Avoid periods of inactivity such as sitting longer than an hour when not asleep. This helps prevent blood clots.  You may return to work once you are cleared by your surgeon.  Do not drive a car for 6 weeks or until released by your surgeon.  Do not drive while taking narcotics.  Wear elastic stockings for two weeks following surgery during the day but you may remove then at night.  Make sure you keep all of your appointments after your operation with all of your doctors and caregivers. You should call the office at the above phone number and make an appointment for approximately two weeks after the date of your surgery. Please pick up a stool softener and laxative  for home use as long as you are requiring pain medications.  ICE to the affected hip every three hours for 30 minutes at a time and then as needed for pain and swelling. Continue to use ice on the hip for pain and swelling from surgery. You may notice swelling that will progress down to the foot and ankle.  This is normal after surgery.  Elevate the leg when you are not up walking on it.   It is important for you to complete the blood thinner medication as prescribed by your doctor.  Continue to use the breathing machine which will help keep your temperature down.  It is common for your temperature to cycle up and down following surgery, especially at night when you are not up moving around and exerting yourself.  The breathing machine keeps your lungs expanded and your temperature down.  RANGE OF MOTION AND STRENGTHENING EXERCISES  These exercises are designed to help you keep full movement of your hip joint. Follow your caregiver's or physical therapist's instructions. Perform all exercises about fifteen times, three times per day or as directed. Exercise both hips, even if you have had only one joint replacement. These exercises can be done on a training (exercise) mat, on the floor, on a table or on a bed. Use whatever works the best and is most comfortable for you. Use music or television while you are exercising so that the exercises are a pleasant break in your day. This   will make your life better with the exercises acting as a break in routine you can look forward to.  Lying on your back, slowly slide your foot toward your buttocks, raising your knee up off the floor. Then slowly slide your foot back down until your leg is straight again.  Lying on your back spread your legs as far apart as you can without causing discomfort.  Lying on your side, raise your upper leg and foot straight up from the floor as far as is comfortable. Slowly lower the leg and repeat.  Lying on your back, tighten up the  muscle in the front of your thigh (quadriceps muscles). You can do this by keeping your leg straight and trying to raise your heel off the floor. This helps strengthen the largest muscle supporting your knee.  Lying on your back, tighten up the muscles of your buttocks both with the legs straight and with the knee bent at a comfortable angle while keeping your heel on the floor.   SKILLED REHAB INSTRUCTIONS: If the patient is transferred to a skilled rehab facility following release from the hospital, a list of the current medications will be sent to the facility for the patient to continue.  When discharged from the skilled rehab facility, please have the facility set up the patient's Home Health Physical Therapy prior to being released. Also, the skilled facility will be responsible for providing the patient with their medications at time of release from the facility to include their pain medication and their blood thinner medication. If the patient is still at the rehab facility at time of the two week follow up appointment, the skilled rehab facility will also need to assist the patient in arranging follow up appointment in our office and any transportation needs.  MAKE SURE YOU:  Understand these instructions.  Will watch your condition.  Will get help right away if you are not doing well or get worse.  Pick up stool softner and laxative for home use following surgery while on pain medications. Daily dry dressing changes as needed. In 4 days, you may remove your dressings and begin taking showers - no tub baths or soaking the incisions. Continue to use ice for pain and swelling after surgery. Do not use any lotions or creams on the incision until instructed by your surgeon.   

## 2022-02-24 NOTE — Progress Notes (Signed)
Nerve block completed in SS, pt vitals stable. Report called to floor nurse, tele monitor in place and d/c to floor.

## 2022-02-24 NOTE — Anesthesia Procedure Notes (Signed)
Date/Time: 02/24/2022 7:46 PM  Performed by: Minerva Ends, CRNAPre-anesthesia Checklist: Patient identified, Emergency Drugs available, Suction available, Patient being monitored and Timeout performed Patient Re-evaluated:Patient Re-evaluated prior to induction Oxygen Delivery Method: Simple face mask Placement Confirmation: positive ETCO2 and breath sounds checked- equal and bilateral Dental Injury: Teeth and Oropharynx as per pre-operative assessment

## 2022-02-24 NOTE — Progress Notes (Signed)
  Progress Note   Patient: Kara Acevedo [id number] DOB: 12/20/1955 DOA: 02/22/2022     1 DOS: the patient was seen and examined on 02/24/2022 at 1:45PM      Brief hospital course: [name] is a 66 y.o. F with HTN, ADHD who presented with hip fracture.   7/16: Admitted, Ortho deferred to weekday service 7/17: Underwent hip block, Ortho plan for OR in the afternoon     Assessment and Plan: * Hip fracture (HCC) - Consult Ortho, appreciate cares  Morbid obesity (HCC) BMI 35.5 with hypertension, osteoarthritis.  Rib pain I have low clinical suspicion for rib fracture, but given trauma, will x-ray when able. Tried yesterday, patient in too much pain. - Obtain rib x-ray when able  Essential hypertension BP normal off meds - Hold amlodipine, indapamide for now  ADHD - Hold adderall  Hypokalemia K supplemented          Subjective: Patient still somewhat sedated from getting her last dose of pain medicine.  Underwent peripheral nerve block this morning.  No fever, respiratory distress, chest pain, palpitations     Physical Exam: Vitals:   02/24/22 0925 02/24/22 0945 02/24/22 1001 02/24/22 1146  BP: 97/60 (!) 101/57 (!) 92/59 (!) 95/58  Pulse: 69 67 69 69  Resp: 15 16 18 14   Temp:   99.7 F (37.6 C) 99.6 F (37.6 C)  TempSrc:   Oral Oral  SpO2: 93% 94% 98% 100%  Weight:      Height:       Adult female, lying in bed, no acute distress RRR, no murmurs, no peripheral edema Respiratory rate normal, lungs clear without rales or wheezes Abdomen soft, no tenderness palpation or guarding Sleepy but oriented, face symmetric, speech fluent     Data Reviewed: Basic metabolic panel shows potassium of 3.5, creatinine and sodium normal  Family Communication: Niece and nephew at the bedside    Disposition: Status is: Inpatient         Author: , MD 02/24/2022 2:03 PM  For on call review [email or web address].

## 2022-02-24 NOTE — Anesthesia Procedure Notes (Signed)
Spinal  Patient location during procedure: OR Start time: 02/24/2022 7:48 PM End time: 02/24/2022 7:56 PM Reason for block: surgical anesthesia Staffing Performed: anesthesiologist  Anesthesiologist: Heather Roberts, MD Performed by: Heather Roberts, MD Authorized by: Heather Roberts, MD   Preanesthetic Checklist Completed: patient identified, IV checked, risks and benefits discussed, surgical consent, monitors and equipment checked, pre-op evaluation and timeout performed Spinal Block Patient position: sitting Prep: DuraPrep Patient monitoring: cardiac monitor, continuous pulse ox and blood pressure Approach: left paramedian Location: L2-3 Injection technique: single-shot Needle Needle type: Pencan  Needle gauge: 24 G Needle length: 9 cm Assessment Events: CSF return Additional Notes Functioning IV was confirmed and monitors were applied. Sterile prep and drape, including hand hygiene and sterile gloves were used. The patient was positioned and the spine was prepped. The skin was anesthetized with lidocaine.  Free flow of clear CSF was obtained prior to injecting local anesthetic into the CSF.  The spinal needle aspirated freely following injection.  The needle was carefully withdrawn.  The patient tolerated the procedure well.

## 2022-02-24 NOTE — TOC Initial Note (Signed)
Transition of Care [facility]) - Initial/Assessment Note    Patient Details  Name: Kara Acevedo MRN: [id number] Date of Birth: July 06, 1956  Transition of Care [facility]) CM/SW Contact:    Lanier Clam, RN Phone Number: 02/24/2022, 1:09 PM  Clinical Narrative: L hi fx. Await post sx & PT eval.                  Expected Discharge Plan:  (TBD) Barriers to Discharge: Continued Medical Work up   Patient Goals and CMS Choice        Expected Discharge Plan and Services Expected Discharge Plan:  (TBD)                                              Prior Living Arrangements/Services                       Activities of Daily Living Home Assistive Devices/Equipment: Eyeglasses ADL Screening (condition at time of admission) Patient's cognitive ability adequate to safely complete daily activities?: Yes Is the patient deaf or have difficulty hearing?: No Does the patient have difficulty seeing, even when wearing glasses/contacts?: No Does the patient have difficulty concentrating, remembering, or making decisions?: No Patient able to express need for assistance with ADLs?: Yes Does the patient have difficulty dressing or bathing?: Yes Independently performs ADLs?: No Communication: Independent Dressing (OT): Needs assistance Is this a change from baseline?: Change from baseline, expected to last <3days Grooming: Needs assistance Is this a change from baseline?: Change from baseline, expected to last <3 days Feeding: Independent Bathing: Needs assistance Is this a change from baseline?: Change from baseline, expected to last <3 days Toileting: Needs assistance Is this a change from baseline?: Change from baseline, expected to last <3 days In/Out Bed: Needs assistance Is this a change from baseline?: Change from baseline, expected to last >3 days Walks in Home: Independent Does the patient have difficulty walking or climbing stairs?: Yes Weakness of Legs:  Left Weakness of Arms/Hands: None  Permission Sought/Granted                  Emotional Assessment              Admission diagnosis:  Hip fracture (HCC) [S72.009A] Closed fracture of left hip, initial encounter [facility]) [S72.002A] Patient Active Problem List   Diagnosis Date Noted   Hip fracture (HCC) 02/23/2022   Hypokalemia 02/23/2022   ADHD 02/23/2022   Essential hypertension 02/23/2022   Rib pain 02/23/2022   Morbid obesity (HCC) 02/23/2022   PCP:  Oneita Hurt, No Pharmacy:   [id number] [place], [place] - [address] AT [facility] [address] [place] [address] Phone: [phone] Fax: [phone]     Social Determinants of Health (SDOH) Interventions    Readmission Risk Interventions     No data to display

## 2022-02-25 DIAGNOSIS — I10 Essential (primary) hypertension: Secondary | ICD-10-CM | POA: Diagnosis not present

## 2022-02-25 DIAGNOSIS — E876 Hypokalemia: Secondary | ICD-10-CM | POA: Diagnosis not present

## 2022-02-25 DIAGNOSIS — F909 Attention-deficit hyperactivity disorder, unspecified type: Secondary | ICD-10-CM | POA: Diagnosis not present

## 2022-02-25 DIAGNOSIS — S72002A Fracture of unspecified part of neck of left femur, initial encounter for closed fracture: Secondary | ICD-10-CM | POA: Diagnosis not present

## 2022-02-25 LAB — BASIC METABOLIC PANEL
Anion gap: 7 (ref 5–15)
BUN: 11 mg/dL (ref 8–23)
CO2: 27 mmol/L (ref 22–32)
Calcium: 8.7 mg/dL — ABNORMAL LOW (ref 8.9–10.3)
Chloride: 105 mmol/L (ref 98–111)
Creatinine, Ser: 0.59 mg/dL (ref 0.44–1.00)
GFR, Estimated: >60 mL/min (ref >60)
Glucose, Bld: 179 mg/dL — ABNORMAL HIGH (ref 70–99)
Potassium: 3 mmol/L — ABNORMAL LOW (ref 3.5–5.1)
Sodium: 139 mmol/L (ref 135–145)

## 2022-02-25 LAB — CBC
HCT: 33.5 % — ABNORMAL LOW (ref 36.0–46.0)
Hemoglobin: 11.3 g/dL — ABNORMAL LOW (ref 12.0–15.0)
MCH: 31.2 pg (ref 26.0–34.0)
MCHC: 33.7 g/dL (ref 30.0–36.0)
MCV: 92.5 fL (ref 80.0–100.0)
Platelets: 182 10*3/uL (ref 150–400)
RBC: 3.62 MIL/uL — ABNORMAL LOW (ref 3.87–5.11)
RDW: 12.8 % (ref 11.5–15.5)
WBC: 10.3 10*3/uL (ref 4.0–10.5)
nRBC: 0 % (ref 0.0–0.2)

## 2022-02-25 MED ORDER — POTASSIUM CHLORIDE 20 MEQ PO PACK
40.0000 meq | PACK | Freq: Two times a day (BID) | ORAL | Status: AC
  Administered 2022-02-25 (×2): 40 meq via ORAL
  Filled 2022-02-25 (×2): qty 2

## 2022-02-25 MED ORDER — HYDROCODONE-ACETAMINOPHEN 10-325 MG PO TABS: 0.5000 | ORAL_TABLET | ORAL | 0 refills | Status: AC | PRN

## 2022-02-25 MED ORDER — DIPHENHYDRAMINE HCL 25 MG PO CAPS
25.0000 mg | ORAL_CAPSULE | Freq: Once | ORAL | Status: AC
  Administered 2022-02-25: 25 mg via ORAL
  Filled 2022-02-25: qty 1

## 2022-02-25 MED ORDER — SODIUM CHLORIDE 0.9 % IV SOLN
INTRAVENOUS | Status: AC
Start: 2022-02-25 — End: 2022-02-25

## 2022-02-25 MED ORDER — ASPIRIN 81 MG PO CHEW: 81.0000 mg | CHEWABLE_TABLET | Freq: Two times a day (BID) | ORAL | 0 refills | Status: AC

## 2022-02-25 NOTE — Evaluation (Signed)
Occupational Therapy Evaluation Patient Details Name: Kara Acevedo MRN: [id number] DOB: Oct 05, 1955 Today's Date: 02/25/2022   History of Present Illness Kara Acevedo is a 66 y.o. female who presented with an intertrochanteric femur fracture. Pt underwent Intramedullary fixation, Left femur on 02/24/2022.   Clinical Impression   Patient is currently requiring assistance with ADLs including up to moderate assist with Lower body ADLs, setup assist with seated Upper body ADLs,  as well as  Minimal assist with bed mobility and heavy use of overhead trapeze, and Min assist with functional pivot transfers to toilet.  Current level of function is below patient's typical baseline.  During this evaluation, patient was limited by generalized weakness, impaired activity tolerance, and post-op pain, all of which has the potential to impact patient's safety and independence during functional mobility, as well as performance for ADLs.  Patient lives at home but will stay with her daughter who is able to provide 24/7 supervision and assistance.  Patient demonstrates good rehab potential, and should benefit from continued skilled occupational therapy services while in acute care to maximize safety, independence and quality of life at home.  Continued occupational therapy services in the home is recommended.  ?    Recommendations for follow up therapy are one component of a multi-disciplinary discharge planning process, led by the attending physician.  Recommendations may be updated based on patient status, additional functional criteria and insurance authorization.   Follow Up Recommendations  Home health OT    Assistance Recommended at Discharge Intermittent Supervision/Assistance  Patient can return home with the following A little help with walking and/or transfers;A little help with bathing/dressing/bathroom;Assist for transportation;Assistance with cooking/housework     Functional Status Assessment  Patient has had a recent decline in their functional status and demonstrates the ability to make significant improvements in function in a reasonable and predictable amount of time.  Equipment Recommendations  BSC/3in1;Other (comment);Tub/shower bench (2 wheeled RW with 5" wheels)    Recommendations for Other Services       Precautions / Restrictions Precautions Precautions: Fall Restrictions Weight Bearing Restrictions: No LLE Weight Bearing: Weight bearing as tolerated      Mobility Bed Mobility Overal bed mobility: Needs Assistance Bed Mobility: Supine to Sit     Supine to sit: HOB elevated, Min assist     General bed mobility comments: Min As to mobilize LLE and cues for sequencing with pt using OHT. Increased time/effort    Transfers                          Balance Overall balance assessment: History of Falls, Needs assistance   Sitting balance-Leahy Scale: Good     Standing balance support: Reliant on assistive device for balance, During functional activity Standing balance-Leahy Scale: Poor                             ADL either performed or assessed with clinical judgement   ADL Overall ADL's : Needs assistance/impaired Eating/Feeding: Independent   Grooming: Set up;Sitting;Wash/dry hands   Upper Body Bathing: Set up;Sitting   Lower Body Bathing: Minimal assistance;Sitting/lateral leans   Upper Body Dressing : Set up;Sitting   Lower Body Dressing: Moderate assistance;Sitting/lateral leans;Sit to/from stand   Toilet Transfer: Minimal assistance;Stand-pivot;BSC/3in1;Rolling walker (2 wheels);Cueing for sequencing;Cueing for safety Toilet Transfer Details (indicate cue type and reason): Pt stood from EOB to RW with Min As. Pt pivoted to [facility]  then pivoted BSC->Recliner with RW and Min As. Toileting- Clothing Manipulation and Hygiene: Minimal assistance;Set up Toileting - Clothing Manipulation Details  (indicate cue type and reason): Min Assist to manage clothing. Pt able to perform anterior peri hygiene with setup/supervision.   Tub/Shower Transfer Details (indicate cue type and reason): Pt given handout and information on tub bench and hand held shower sprayer as well as long handled bath sponge or brush for ease. Functional mobility during ADLs: Minimal assistance;Cueing for sequencing;Rolling walker (2 wheels) General ADL Comments: Pt educated on kitchen safety when mobilizing with RW and compensatory and safety strategies for ADLs with handout provided to reinforce.     Vision Baseline Vision/History: 0 No visual deficits (readers)       Perception     Praxis      Pertinent Vitals/Pain Pain Assessment Pain Assessment: 0-10 Pain Score: 3  Pain Location: LT hip Pain Intervention(s): Monitored during session, Limited activity within patient's tolerance, Premedicated before session     Hand Dominance Right   Extremity/Trunk Assessment Upper Extremity Assessment Upper Extremity Assessment: Overall WFL for tasks assessed   Lower Extremity Assessment Lower Extremity Assessment: Defer to PT evaluation   Cervical / Trunk Assessment Cervical / Trunk Assessment: Normal   Communication Communication Communication: No difficulties   Cognition Arousal/Alertness: Awake/alert Behavior During Therapy: WFL for tasks assessed/performed Overall Cognitive Status: Within Functional Limits for tasks assessed                                       General Comments       Exercises     Shoulder Instructions      Home Living Family/patient expects to be discharged to:: Private residence Living Arrangements: Other relatives Available Help at Discharge: Family;Available 24 hours/day (Will stay with daughter who will work work from home) Type of Home: Apartment Home Access: Level entry     Home Layout: One level     Bathroom Shower/Tub: Heritage manager: Standard     Home Equipment: Grab bars - tub/shower   Additional Comments: "Ice Man"      Prior Functioning/Environment Prior Level of Function : Independent/Modified Independent;Driving;History of Falls (last six months)             Mobility Comments: Independent          OT Problem List: Pain;Decreased strength;Decreased activity tolerance;Impaired balance (sitting and/or standing);Decreased knowledge of use of DME or AE      OT Treatment/Interventions: Self-care/ADL training;Therapeutic activities;DME and/or AE instruction;Patient/family education;Balance training    OT Goals(Current goals can be found in the care plan section) Acute Rehab OT Goals Patient Stated Goal: To remains in the hospital for another day or two to contiunue therapy prior to home. OT Goal Formulation: With patient Time For Goal Achievement: 03/11/22 Potential to Achieve Goals: Good ADL Goals Pt Will Perform Lower Body Bathing: with adaptive equipment;with supervision;sitting/lateral leans Pt Will Perform Lower Body Dressing: with adaptive equipment;with supervision;with set-up;sitting/lateral leans;sit to/from stand Pt Will Transfer to Toilet: with supervision;ambulating Pt Will Perform Toileting - Clothing Manipulation and hygiene: with modified independence;sitting/lateral leans;sit to/from stand Pt Will Perform Tub/Shower Transfer: Tub transfer;tub bench;with min assist (Simulated as able.)  OT Frequency: Min 3X/week    Co-evaluation              AM-PAC OT "6 Clicks" Daily Activity     Outcome Measure Help  from another person eating meals?: None Help from another person taking care of personal grooming?: A Little Help from another person toileting, which includes using toliet, bedpan, or urinal?: A Little Help from another person bathing (including washing, rinsing, drying)?: A Little Help from another person to put on and taking off regular upper body clothing?: A  Little Help from another person to put on and taking off regular lower body clothing?: A Lot 6 Click Score: 18   End of Session Equipment Utilized During Treatment: Gait belt;Rolling walker (2 wheels) Nurse Communication: Mobility status  Activity Tolerance: Patient tolerated treatment well Patient left: in chair;with call bell/phone within reach;with chair alarm set;with nursing/sitter in room  OT Visit Diagnosis: Unsteadiness on feet (R26.81);History of falling (Z91.81);Pain Pain - Right/Left: Left Pain - part of body: Hip                Time: 0930-1002 OT Time Calculation (min): 32 min Charges:  OT General Charges $OT Visit: 1 Visit OT Evaluation $OT Eval Low Complexity: 1 Low OT Treatments $Self Care/Home Management : 8-22 mins  Victorino Dike, OT Acute Rehab Services Office: [phone] 02/25/2022  Theodoro Clock 02/25/2022, 10:20 AM

## 2022-02-25 NOTE — Progress Notes (Signed)
  Progress Note   Patient: Kara Acevedo MWN:[id number] DOB: Feb 19, 1956 DOA: 02/22/2022     2 DOS: the patient was seen and examined on 02/25/2022 at 12:00 PM      Brief hospital course: [name] is a 65 y.o. F with HTN, ADHD who presented with hip fracture.   7/16: Admitted, Ortho deferred to weekday service 7/17: To the OR for IM nail by Dr. Veda Canning     Assessment and Plan: * Hip fracture [facility]) S/p Intramedullary fixation, Left femur on 7/17 by Dr. Veda Canning - WBAT with walker - Lovenox for DVT ppx while in house, aspirin 81 bid after discharge - PT eval    Morbid obesity (HCC) BMI 35.5 with hypertension, osteoarthritis.  Rib pain This is improving.  We were going to obtain a rib x-ray, but the patient feels this is improving and I do not think it is necessary.  Essential hypertension BP normal off meds - Hold amlodipine, indapamide for now  ADHD - Hold adderall  Hypokalemia K down to 3.0 again today - Supplement potassium          Subjective: Patient has some soreness in the left hip, but doing well, no chest pain, dyspnea, nausea.     Physical Exam: Vitals:   02/25/22 0221 02/25/22 0604 02/25/22 0914 02/25/22 1330  BP: 120/90 112/66 103/65 108/66  Pulse: 79 73 66 74  Resp: 18 18 18 17   Temp: 98.3 F (36.8 C) 97.8 F (36.6 C) 98.1 F (36.7 C) 97.9 F (36.6 C)  TempSrc:   Oral Oral  SpO2: 93% 94% 96% 98%  Weight:      Height:       Adult female, lying in bed, no acute distress, interactive and appropriate RRR, no murmurs, no peripheral edema Respiratory rate normal, lungs clear without rales or wheezes Abdomen soft no tenderness palpation or guarding Dressings in place, clean dry and intact, no significant surrounding bruising or bleeding or discharge Attention normal, affect normal, judgment and insight appear normal, face symmetric, speech fluent, oriented to person, place, time, and situation, moves upper extremities  with normal strength and coordination    Data Reviewed: Basic metabolic panel shows normal sodium and creatinine, potassium down to 3 Hemogram shows hemoglobin down to 11  Family Communication: Spoke with daughter by phone    Disposition: Status is: Inpatient Admitted with hip fracture, underwent uncomplicted IM nail on 7/17, hopefully home tomorrow or Thurs.           Author: 8/17, MD 02/25/2022 2:32 PM  For on call review [email or web address].

## 2022-02-25 NOTE — Progress Notes (Signed)
    Subjective:  Patient reports pain as mild to moderate.  Denies N/V/CP/SOB/Abd pain. She is doing well this morning. She is having some pain in her thigh muscle that is to be expected. She denies tingling and numbness in her LE bilaterally. She is currently icing her hip.   Objective:   VITALS:   Vitals:   02/24/22 2300 02/24/22 2317 02/25/22 0221 02/25/22 0604  BP: 102/74 116/62 120/90 112/66  Pulse: 70 73 79 73  Resp: 13 18 18 18   Temp: 97.7 F (36.5 C) 98.2 F (36.8 C) 98.3 F (36.8 C) 97.8 F (36.6 C)  TempSrc:  Oral    SpO2: 95% 93% 93% 94%  Weight:      Height:        Patient is lying comfortably in bed. NAD.  Neurologically intact ABD soft Neurovascular intact Sensation intact distally Intact pulses distally Dorsiflexion/Plantar flexion intact No cellulitis present Compartment soft Mepilex dressings C/D/I x3.    Lab Results  Component Value Date   WBC 10.3 02/25/2022   HGB 11.3 (L) 02/25/2022   HCT 33.5 (L) 02/25/2022   MCV 92.5 02/25/2022   PLT 182 02/25/2022   BMET    Component Value Date/Time   NA 139 02/25/2022 0341   K 3.0 (L) 02/25/2022 0341   CL 105 02/25/2022 0341   CO2 27 02/25/2022 0341   GLUCOSE 179 (H) 02/25/2022 0341   BUN 11 02/25/2022 0341   CREATININE 0.59 02/25/2022 0341   CALCIUM 8.7 (L) 02/25/2022 0341   GFRNONAA >60 02/25/2022 0341     Assessment/Plan: 1 Day Post-Op   Principal Problem:   Hip fracture (HCC) Active Problems:   Hypokalemia   ADHD   Essential hypertension   Rib pain   Morbid obesity (HCC)  Hypokalemia - her potassium is 3.0 this morning she is currently getting IV potassium.   WBAT with walker DVT ppx: Lovenox during hospital stay. Will switch to aspirin 81mg  BID at discharge, SCDs, TEDS PO pain control PT/OT: PT to come by today.  Dispo: Disposition per medical team. PT to come see patient today. Continue cryotherapy for pain and swelling. Pain medication and DVT ppx printed in chart.    02/27/2022, PA-C 02/25/2022, 7:18 AM  [facility]  [place] [address], [place], [address] [place] Phone: [phone] [email or web address] Facebook  [phone]

## 2022-02-25 NOTE — Evaluation (Signed)
Physical Therapy Evaluation Patient Details Name: Kara Acevedo MRN: [id number] DOB: Jul 09, 1956 Today's Date: 02/25/2022  History of Present Illness  Pt is a 66 y.o. female who presented with an intertrochanteric femur fracture. Pt underwent Intramedullary fixation, Left femur on 02/24/2022.  Clinical Impression  Patient is s/p above surgery resulting in functional limitations due to the deficits listed below (see PT Problem List).  Patient will benefit from skilled PT to increase their independence and safety with mobility to allow discharge to the venue listed below.  Pt assisted with ambulating however only able to tolerate very short distance (about 6 ft) due to pain (despite IV premedication).  Pt plans to d/c home with assist from daughter.         Recommendations for follow up therapy are one component of a multi-disciplinary discharge planning process, led by the attending physician.  Recommendations may be updated based on patient status, additional functional criteria and insurance authorization.  Follow Up Recommendations Home health PT      Assistance Recommended at Discharge Intermittent Supervision/Assistance  Patient can return home with the following  A little help with walking and/or transfers;A little help with bathing/dressing/bathroom;Assist for transportation    Equipment Recommendations Rolling walker (2 wheels)  Recommendations for Other Services       Functional Status Assessment Patient has had a recent decline in their functional status and demonstrates the ability to make significant improvements in function in a reasonable and predictable amount of time.     Precautions / Restrictions Precautions Precautions: Fall Restrictions Weight Bearing Restrictions: No LLE Weight Bearing: Weight bearing as tolerated      Mobility  Bed Mobility Overal bed mobility: Needs Assistance Bed Mobility: Sit to Supine       Sit to supine: HOB  elevated, Min guard   General bed mobility comments: verbal cues for self assist, required increased time    Transfers Overall transfer level: Needs assistance Equipment used: Rolling walker (2 wheels) Transfers: Sit to/from Stand Sit to Stand: Min assist           General transfer comment: verbal cues for positioning and technique, assist to steady with rise and control descent    Ambulation/Gait Ambulation/Gait assistance: Min guard, Min assist Gait Distance (Feet): 6 Feet Assistive device: Rolling walker (2 wheels) Gait Pattern/deviations: Step-to pattern, Decreased stance time - left, Antalgic Gait velocity: decr     General Gait Details: verbal cues for sequence, RW positioning, step length; pt limited distance due to pain  Stairs            Wheelchair Mobility    Modified Rankin (Stroke Patients Only)       Balance Overall balance assessment: History of Falls, Needs assistance         Standing balance support: Reliant on assistive device for balance, During functional activity Standing balance-Leahy Scale: Poor                               Pertinent Vitals/Pain Pain Assessment Pain Assessment: 0-10 Pain Score: 6  Pain Location: LT hip Pain Descriptors / Indicators: Sore, Guarding, Grimacing Pain Intervention(s): Monitored during session, Premedicated before session, Repositioned (premedicated with IV pain meds)    Home Living Family/patient expects to be discharged to:: Private residence Living Arrangements: Other relatives Available Help at Discharge: Family;Available 24 hours/day (Will stay with daughter who will work work from home) Type of Home: Apartment Home Access: Level entry  Home Layout: One level Home Equipment: Grab bars - tub/shower Additional Comments: Pt plans to have assist from one of her four daughters upon d/c.    Prior Function Prior Level of Function : Independent/Modified  Independent;Driving;History of Falls (last six months)             Mobility Comments: Independent       Hand Dominance   Dominant Hand: Right    Extremity/Trunk Assessment   Upper Extremity Assessment Upper Extremity Assessment: Overall WFL for tasks assessed    Lower Extremity Assessment Lower Extremity Assessment: LLE deficits/detail LLE Deficits / Details: requiring UE assist/support, limited movement due to pain    Cervical / Trunk Assessment Cervical / Trunk Assessment: Normal  Communication   Communication: No difficulties  Cognition Arousal/Alertness: Awake/alert Behavior During Therapy: WFL for tasks assessed/performed Overall Cognitive Status: Within Functional Limits for tasks assessed                                          General Comments      Exercises     Assessment/Plan    PT Assessment Patient needs continued PT services  PT Problem List Decreased strength;Decreased activity tolerance;Decreased mobility;Decreased balance;Decreased knowledge of use of DME       PT Treatment Interventions DME instruction;Gait training;Balance training;Therapeutic exercise;Functional mobility training;Therapeutic activities;Patient/family education    PT Goals (Current goals can be found in the Care Plan section)  Acute Rehab PT Goals PT Goal Formulation: With patient Time For Goal Achievement: 03/11/22 Potential to Achieve Goals: Good    Frequency Min 5X/week     Co-evaluation               AM-PAC PT "6 Clicks" Mobility  Outcome Measure Help needed turning from your back to your side while in a flat bed without using bedrails?: A Little Help needed moving from lying on your back to sitting on the side of a flat bed without using bedrails?: A Little Help needed moving to and from a bed to a chair (including a wheelchair)?: A Little Help needed standing up from a chair using your arms (e.g., wheelchair or bedside chair)?: A  Lot Help needed to walk in hospital room?: A Lot Help needed climbing 3-5 steps with a railing? : A Lot 6 Click Score: 15    End of Session Equipment Utilized During Treatment: Gait belt Activity Tolerance: Patient limited by pain Patient left: in bed;with call bell/phone within reach;with bed alarm set Nurse Communication: Mobility status PT Visit Diagnosis: Difficulty in walking, not elsewhere classified (R26.2)    Time: 9030-0923 PT Time Calculation (min) (ACUTE ONLY): 16 min   Charges:   PT Evaluation $PT Eval Low Complexity: 1 Low        Kati PT, DPT Physical Therapist Acute Rehabilitation Services Preferred contact method: Secure Chat Weekend Pager Only: [phone] Office: [phone]   Kara Acevedo [place] 02/25/2022, 1:44 PM

## 2022-02-25 NOTE — Anesthesia Postprocedure Evaluation (Signed)
Anesthesia Post Note  Patient: Kara Acevedo  Procedure(s) Performed: INTRAMEDULLARY (IM) NAIL INTERTROCHANTRIC (Left: Hip)     Patient location during evaluation: PACU Anesthesia Type: MAC and Spinal Level of consciousness: awake and alert Pain management: pain level controlled Vital Signs Assessment: post-procedure vital signs reviewed and stable Respiratory status: spontaneous breathing and respiratory function stable Cardiovascular status: blood pressure returned to baseline and stable Postop Assessment: spinal receding Anesthetic complications: no   No notable events documented.                 Kara Acevedo

## 2022-02-25 NOTE — TOC Initial Note (Signed)
Transition of Care [facility]) - Initial/Assessment Note   Patient Details  Name: Kara Acevedo MRN: [id number] Date of Birth: 1956-02-09  Transition of Care [facility]) CM/SW Contact:    Sherie Don, LCSW Phone Number: 02/25/2022, 3:33 PM  Clinical Narrative: PT and OT evaluations recommended HHPT/OT as well as a rolling walker and 3N1. CSW met with patient to discuss recommendations. Patient is agreeable to [facility] and requested [facility] as she has used the agency before. Patient received a rolling walker and 3N1 last year, but gave both away so she is aware she will need to private pay for both.  CSW made [facility] referral to Cindie with [facility], which was accepted. HH orders have been placed. CSW made DME referral to [place] with [facility]. [facility] to deliver DME to patient's room. Patient updated.  Expected Discharge Plan: [place] Barriers to Discharge: Continued Medical Work up  Patient Goals and CMS Choice Patient states their goals for this hospitalization and ongoing recovery are:: Return home with [facility] CMS Medicare.gov Compare Post Acute Care list provided to:: Patient Choice offered to / list presented to : Patient  Expected Discharge Plan and Services Expected Discharge Plan: [place] In-house Referral: Clinical Social Work Post Acute Care Choice: Durable Medical Equipment, Home Health Living arrangements for the past 2 months: [place]            DME Arranged: 3-N-1, Walker rolling DME Agency: [facility] Date DME Agency Contacted: 02/25/22 Representative spoke with at DME Agency: Andee Poles HH Arranged: PT, OT [place] Agency: [place] Date [place]: 02/25/22 Time [place]: 1519 Representative spoke with at [place]: Cindie  Prior Living Arrangements/Services Living arrangements for the past 2 months: [place] Patient language and need for interpreter reviewed:: Yes Do you feel safe going  back to the place where you live?: Yes      Need for Family Participation in Patient Care: No (Comment) Care giver support system in place?: Yes (comment) Criminal Activity/Legal Involvement Pertinent to Current Situation/Hospitalization: No - Comment as needed  Activities of Daily Living Home Assistive Devices/Equipment: Eyeglasses ADL Screening (condition at time of admission) Patient's cognitive ability adequate to safely complete daily activities?: Yes Is the patient deaf or have difficulty hearing?: No Does the patient have difficulty seeing, even when wearing glasses/contacts?: No Does the patient have difficulty concentrating, remembering, or making decisions?: No Patient able to express need for assistance with ADLs?: Yes Does the patient have difficulty dressing or bathing?: Yes Independently performs ADLs?: No Communication: Independent Dressing (OT): Needs assistance Is this a change from baseline?: Change from baseline, expected to last <3days Grooming: Needs assistance Is this a change from baseline?: Change from baseline, expected to last <3 days Feeding: Independent Bathing: Needs assistance Is this a change from baseline?: Change from baseline, expected to last <3 days Toileting: Needs assistance Is this a change from baseline?: Change from baseline, expected to last <3 days In/Out Bed: Needs assistance Is this a change from baseline?: Change from baseline, expected to last >3 days Walks in Home: Independent Does the patient have difficulty walking or climbing stairs?: Yes Weakness of Legs: Left Weakness of Arms/Hands: None  Permission Sought/Granted Permission sought to share information with : Other (comment) Permission granted to share information with : Yes, Verbal Permission Granted Permission granted to share info w AGENCY: [facility], HH agencies  Emotional Assessment Appearance:: Appears stated age Attitude/Demeanor/Rapport: Engaged Affect (typically  observed): Accepting  Orientation: : Oriented to Self, Oriented to Place, Oriented to  Time, Oriented to Situation Alcohol / Substance Use: Not Applicable  Admission diagnosis:  Hip fracture ([facility]) [S72.009A] Closed fracture of left hip, initial encounter [facility]) [S72.002A] Patient Active Problem List   Diagnosis Date Noted   Hip fracture ([place]) 02/23/2022   Hypokalemia 02/23/2022   ADHD 02/23/2022   Essential hypertension 02/23/2022   Rib pain 02/23/2022   Morbid obesity ([place]) 02/23/2022   PCP:  Merryl Hacker, No Pharmacy:   [facility] [place], [place] - [address] AT [place] [address] Phone: [phone] Fax: [phone]  Readmission Risk Interventions     No data to display

## 2022-02-26 ENCOUNTER — Encounter (HOSPITAL_COMMUNITY): Payer: Self-pay | Admitting: Orthopedic Surgery

## 2022-02-26 DIAGNOSIS — S72001A Fracture of unspecified part of neck of right femur, initial encounter for closed fracture: Secondary | ICD-10-CM | POA: Diagnosis not present

## 2022-02-26 DIAGNOSIS — E876 Hypokalemia: Secondary | ICD-10-CM | POA: Diagnosis not present

## 2022-02-26 LAB — BASIC METABOLIC PANEL
Anion gap: 6 (ref 5–15)
BUN: 13 mg/dL (ref 8–23)
CO2: 28 mmol/L (ref 22–32)
Calcium: 8.8 mg/dL — ABNORMAL LOW (ref 8.9–10.3)
Chloride: 108 mmol/L (ref 98–111)
Creatinine, Ser: 0.7 mg/dL (ref 0.44–1.00)
GFR, Estimated: >60 mL/min (ref >60)
Glucose, Bld: 92 mg/dL (ref 70–99)
Potassium: 3.8 mmol/L (ref 3.5–5.1)
Sodium: 142 mmol/L (ref 135–145)

## 2022-02-26 LAB — CBC
HCT: 32.1 % — ABNORMAL LOW (ref 36.0–46.0)
Hemoglobin: 10.4 g/dL — ABNORMAL LOW (ref 12.0–15.0)
MCH: 31 pg (ref 26.0–34.0)
MCHC: 32.4 g/dL (ref 30.0–36.0)
MCV: 95.5 fL (ref 80.0–100.0)
Platelets: 181 10*3/uL (ref 150–400)
RBC: 3.36 MIL/uL — ABNORMAL LOW (ref 3.87–5.11)
RDW: 13.2 % (ref 11.5–15.5)
WBC: 9.5 10*3/uL (ref 4.0–10.5)
nRBC: 0 % (ref 0.0–0.2)

## 2022-02-26 MED ORDER — POLYETHYLENE GLYCOL 3350 17 G PO PACK
17.0000 g | PACK | Freq: Every day | ORAL | Status: DC
  Administered 2022-02-26: 17 g via ORAL

## 2022-02-26 MED ORDER — DIPHENHYDRAMINE HCL 25 MG PO CAPS
25.0000 mg | ORAL_CAPSULE | Freq: Three times a day (TID) | ORAL | Status: DC | PRN
Start: 2022-02-26 — End: 2022-02-28
  Administered 2022-02-26 – 2022-02-28 (×6): 25 mg via ORAL
  Filled 2022-02-26 (×6): qty 1

## 2022-02-26 NOTE — Progress Notes (Signed)
Physical Therapy Treatment Patient Details Name: Kara Acevedo MRN: [id number] DOB: June 23, 1956 Today's Date: 02/26/2022   History of Present Illness Pt is a 66 y.o. female who presented with an intertrochanteric femur fracture. Pt underwent Intramedullary fixation, Left femur on 02/24/2022.    PT Comments    Pt assisted with ambulating in hallway and able to progress distance however remains limited by pain and requiring increased time.    Recommendations for follow up therapy are one component of a multi-disciplinary discharge planning process, led by the attending physician.  Recommendations may be updated based on patient status, additional functional criteria and insurance authorization.  Follow Up Recommendations  Home health PT     Assistance Recommended at Discharge Intermittent Supervision/Assistance  Patient can return home with the following A little help with walking and/or transfers;A little help with bathing/dressing/bathroom;Assist for transportation   Equipment Recommendations  Rolling walker (2 wheels)    Recommendations for Other Services       Precautions / Restrictions Precautions Precautions: Fall Restrictions Weight Bearing Restrictions: No LLE Weight Bearing: Weight bearing as tolerated     Mobility  Bed Mobility Overal bed mobility: Needs Assistance Bed Mobility: Sit to Supine       Sit to supine: HOB elevated, Min assist   General bed mobility comments: cues for self assist, assist for L LE due to pain    Transfers Overall transfer level: Needs assistance Equipment used: Rolling walker (2 wheels) Transfers: Sit to/from Stand Sit to Stand: Min guard           General transfer comment: verbal cues for positioning and technique    Ambulation/Gait Ambulation/Gait assistance: Min guard Gait Distance (Feet): 60 Feet Assistive device: Rolling walker (2 wheels) Gait Pattern/deviations: Step-to pattern, Decreased stance time  - left, Antalgic Gait velocity: decr     General Gait Details: verbal cues for sequence, RW positioning, step length; required increased time to perform   Stairs             Wheelchair Mobility    Modified Rankin (Stroke Patients Only)       Balance                                            Cognition Arousal/Alertness: Awake/alert Behavior During Therapy: WFL for tasks assessed/performed Overall Cognitive Status: Within Functional Limits for tasks assessed                                          Exercises      General Comments        Pertinent Vitals/Pain Pain Assessment Pain Assessment: 0-10 Pain Score: 10-Worst pain ever Pain Location: LT hip especially with hip flexion Pain Descriptors / Indicators: Sore, Guarding, Grimacing Pain Intervention(s): Monitored during session, Premedicated before session, Repositioned, Ice applied    Home Living                          Prior Function            PT Goals (current goals can now be found in the care plan section) Progress towards PT goals: Progressing toward goals    Frequency    Min 5X/week      PT Plan Current plan  remains appropriate    Co-evaluation              AM-PAC PT "6 Clicks" Mobility   Outcome Measure  Help needed turning from your back to your side while in a flat bed without using bedrails?: A Little Help needed moving from lying on your back to sitting on the side of a flat bed without using bedrails?: A Little Help needed moving to and from a bed to a chair (including a wheelchair)?: A Little Help needed standing up from a chair using your arms (e.g., wheelchair or bedside chair)?: A Little Help needed to walk in hospital room?: A Little Help needed climbing 3-5 steps with a railing? : A Lot 6 Click Score: 17    End of Session Equipment Utilized During Treatment: Gait belt Activity Tolerance: Patient limited by  pain Patient left: in bed;with call bell/phone within reach Nurse Communication: Mobility status PT Visit Diagnosis: Difficulty in walking, not elsewhere classified (R26.2)     Time: 7408-1448 PT Time Calculation (min) (ACUTE ONLY): 31 min  Charges:  $Gait Training: 23-37 mins                     Thomasene Mohair PT, DPT Physical Therapist Acute Rehabilitation Services Preferred contact method: Secure Chat Weekend Pager Only: [phone] Office: [phone]    Janan Halter [place] 02/26/2022, 4:11 PM

## 2022-02-26 NOTE — Progress Notes (Addendum)
  Progress Note   Patient: Kara Acevedo IWP:[id number] DOB: 11/10/1955 DOA: 02/22/2022     3 DOS: the patient was seen and examined on 02/26/2022 at 12:00 PM      Brief hospital course: Kara Acevedo is a 66 y.o. F with HTN, ADHD who presented with hip fracture.   7/16: Admitted, Ortho deferred to weekday service 7/17: To the OR for IM nail by Dr. Veda Canning     Assessment and Plan:  * Hip fracture [facility]) S/p Intramedullary fixation, Left femur on 7/17 by Dr. Veda Canning - WBAT with walker - Lovenox for DVT ppx while in house, aspirin 81 bid after discharge - PT eval recommended home PT OT -check vit d   Rib pain   a rib x-ray was initially considered but not obtained as  patient feels this is improving  F/u with pcp  Essential hypertension Bp low normal without home bp meds Continue home home bp meds  amlodipine, indapamide for now  Hypokalemia K improved to 3.5 today - Supplement potassium Encourage oral intake  ADHD - adderall held since admission    Morbid obesity (HCC) BMI 35.5 with hypertension, osteoarthritis. On semaglutide at home for weight management        Subjective:  She does not feel she can go home today, she reports pain is not well controlled Reports poor appetite, decreased urine output. Bladder scan is 0.  Reports narcotic cause itching  Wants miralax for constipation   Physical Exam: Vitals:   02/25/22 2003 02/26/22 0032 02/26/22 0458 02/26/22 0958  BP: 111/71 (!) 147/73 120/81 (!) 101/55  Pulse: 69 63 65 81  Resp: 18 18 18 16   Temp: 97.9 F (36.6 C) 98.2 F (36.8 C) 98.1 F (36.7 C) 99.2 F (37.3 C)  TempSrc: Oral Oral Oral Oral  SpO2: 97% 99% (!) 88% 92%  Weight:      Height:       Adult female, lying in bed, no acute distress, interactive and appropriate RRR, no murmurs, no peripheral edema Respiratory rate normal, lungs clear without rales or wheezes Abdomen soft no tenderness palpation or guarding Dressings  in place, clean dry and intact, no significant surrounding bruising or bleeding or discharge Attention normal, affect normal, judgment and insight appear normal, face symmetric, speech fluent, oriented to person, place, time, and situation, moves upper extremities with normal strength and coordination    Data Reviewed: Basic metabolic panel shows normal sodium and creatinine, potassium down to 3 Hemogram shows hemoglobin down to 11  Family Communication: Spoke with daughter by phone    Disposition: Status is: Inpatient Admitted with hip fracture, underwent uncomplicted IM nail on 7/17, hopefully home with home health on 7/20           Author: 8/20, MD PhD FACP 02/26/2022 10:06 AM  For on call review [email or web address]/28/[email or web address].

## 2022-02-26 NOTE — Plan of Care (Signed)
  Problem: Coping: Goal: Level of anxiety will decrease Outcome: Progressing   Problem: Pain Managment: Goal: General experience of comfort will improve Outcome: Progressing   

## 2022-02-26 NOTE — Progress Notes (Signed)
    Subjective:  Patient reports pain as moderate.  Denies N/V/CP/SOB/Abd pain. She stated that she ambulated with PT yesterday and used the bedside commode and her leg was really sore yesterday. She states she is going to try and ambulate to the bathroom today. She denies tingling and numbness in LE bilaterally.   Patient had ice sitting on bedside table encouraged her to use the ice regularly for pain control and swelling. She states she used it all night.   Objective:   VITALS:   Vitals:   02/25/22 1750 02/25/22 2003 02/26/22 0032 02/26/22 0458  BP: 103/63 111/71 (!) 147/73 120/81  Pulse: 65 69 63 65  Resp: 16 18 18 18   Temp: 97.9 F (36.6 C) 97.9 F (36.6 C) 98.2 F (36.8 C) 98.1 F (36.7 C)  TempSrc: Oral Oral Oral Oral  SpO2: 99% 97% 99% (!) 88%  Weight:      Height:        Lying in bed sleeping. NAD. Woke patient up for examination this morning.  Neurologically intact ABD soft Neurovascular intact Sensation intact distally Intact pulses distally Dorsiflexion/Plantar flexion intact No cellulitis present Compartment soft Mepilex dressing C/D/I x3.    Lab Results  Component Value Date   WBC 9.5 02/26/2022   HGB 10.4 (L) 02/26/2022   HCT 32.1 (L) 02/26/2022   MCV 95.5 02/26/2022   PLT 181 02/26/2022   BMET    Component Value Date/Time   NA 142 02/26/2022 0324   K 3.8 02/26/2022 0324   CL 108 02/26/2022 0324   CO2 28 02/26/2022 0324   GLUCOSE 92 02/26/2022 0324   BUN 13 02/26/2022 0324   CREATININE 0.70 02/26/2022 0324   CALCIUM 8.8 (L) 02/26/2022 0324   GFRNONAA >60 02/26/2022 0324     Assessment/Plan: 2 Days Post-Op   Principal Problem:   Hip fracture (HCC) Active Problems:   Hypokalemia   ADHD   Essential hypertension   Rib pain   Morbid obesity (HCC)  Potassium 3.8 this morning.   WBAT with walker DVT ppx: Lovenox during hospital stay, will transition to Aspirin 81mg  BID at discharge, SCDs, TEDS PO pain control PT/OT: Ambulated with  PT 6 feet yesterday, limited due to pain. PT to see again today.  Dispo: Disposition per medical team. PT to continue today. Continue cryotherapy for pain and swelling. Pain medication and DVT ppx printed in chart.    02/28/2022, PA-C 02/26/2022, 7:29 AM  [facility] is now 02/28/2022 [address], [address], [place], [address] [place] Phone: [phone] [email or web address] Facebook  [phone]

## 2022-02-26 NOTE — Plan of Care (Signed)
Plan of care reviewed and discussed with the patient. 

## 2022-02-26 NOTE — Progress Notes (Signed)
Physical Therapy Treatment Patient Details Name: Kara Acevedo MRN: [id number] DOB: December 05, 1955 Today's Date: 02/26/2022   History of Present Illness Pt is a 66 y.o. female who presented with an intertrochanteric femur fracture. Pt underwent Intramedullary fixation, Left femur on 02/24/2022.    PT Comments    Pt assisted with ambulating in hallway.  Pt very slow to mobilize and reports increased sharp pain with left hip flexion.  Pt reported feeling warm and a little dizzy upon returning to room however BP 142/70 bpm and HR 84 bpm.    Recommendations for follow up therapy are one component of a multi-disciplinary discharge planning process, led by the attending physician.  Recommendations may be updated based on patient status, additional functional criteria and insurance authorization.  Follow Up Recommendations  Home health PT     Assistance Recommended at Discharge Intermittent Supervision/Assistance  Patient can return home with the following A little help with walking and/or transfers;A little help with bathing/dressing/bathroom;Assist for transportation   Equipment Recommendations  Rolling walker (2 wheels)    Recommendations for Other Services       Precautions / Restrictions Precautions Precautions: Fall Restrictions Weight Bearing Restrictions: No LLE Weight Bearing: Weight bearing as tolerated     Mobility  Bed Mobility               General bed mobility comments: pt in recliner on arrival    Transfers Overall transfer level: Needs assistance Equipment used: Rolling walker (2 wheels) Transfers: Sit to/from Stand Sit to Stand: Min guard           General transfer comment: verbal cues for positioning and technique    Ambulation/Gait Ambulation/Gait assistance: Min guard, Min assist Gait Distance (Feet): 30 Feet Assistive device: Rolling walker (2 wheels) Gait Pattern/deviations: Step-to pattern, Decreased stance time - left,  Antalgic Gait velocity: decr     General Gait Details: verbal cues for sequence, RW positioning, step length; pt limited distance due to pain, also required increased time to perform   Stairs             Wheelchair Mobility    Modified Rankin (Stroke Patients Only)       Balance                                            Cognition Arousal/Alertness: Awake/alert Behavior During Therapy: WFL for tasks assessed/performed Overall Cognitive Status: Within Functional Limits for tasks assessed                                          Exercises      General Comments        Pertinent Vitals/Pain Pain Assessment Pain Assessment: 0-10 Pain Score: 8  Pain Location: LT hip especially with hip flexion Pain Descriptors / Indicators: Sore, Guarding, Grimacing Pain Intervention(s): Monitored during session, Repositioned, Premedicated before session    Home Living                          Prior Function            PT Goals (current goals can now be found in the care plan section) Progress towards PT goals: Progressing toward goals    Frequency  Min 5X/week      PT Plan Current plan remains appropriate    Co-evaluation              AM-PAC PT "6 Clicks" Mobility   Outcome Measure  Help needed turning from your back to your side while in a flat bed without using bedrails?: A Little Help needed moving from lying on your back to sitting on the side of a flat bed without using bedrails?: A Little Help needed moving to and from a bed to a chair (including a wheelchair)?: A Little Help needed standing up from a chair using your arms (e.g., wheelchair or bedside chair)?: A Little Help needed to walk in hospital room?: A Lot Help needed climbing 3-5 steps with a railing? : A Lot 6 Click Score: 16    End of Session Equipment Utilized During Treatment: Gait belt Activity Tolerance: Patient limited by  pain Patient left: with call bell/phone within reach;in chair Nurse Communication: Mobility status PT Visit Diagnosis: Difficulty in walking, not elsewhere classified (R26.2)     Time: 3005-1102 PT Time Calculation (min) (ACUTE ONLY): 25 min  Charges:  $Gait Training: 23-37 mins                    Kara Acevedo PT, DPT Physical Therapist Acute Rehabilitation Services Preferred contact method: Secure Chat Weekend Pager Only: [phone] Office: [phone]    Kara Acevedo 02/26/2022, 4:07 PM

## 2022-02-26 NOTE — Progress Notes (Signed)
Occupational Therapy Treatment Patient Details Name: Kara Acevedo MRN: [id number] DOB: 1955-12-25 Today's Date: 02/26/2022   History of present illness Pt is a 66 y.o. female who presented with an intertrochanteric femur fracture. Pt underwent Intramedullary fixation, Left femur on 02/24/2022.   OT comments  Patient was educated on AE for LB dressing/bathing tasks. Patient was able to complete toileting tasks with min guard at RW level. Patient would continue to benefit from skilled OT services at this time while admitted and after d/c to address noted deficits in order to improve overall safety and independence in ADLs.     Recommendations for follow up therapy are one component of a multi-disciplinary discharge planning process, led by the attending physician.  Recommendations may be updated based on patient status, additional functional criteria and insurance authorization.    Follow Up Recommendations  Home health OT    Assistance Recommended at Discharge Intermittent Supervision/Assistance  Patient can return home with the following  A little help with walking and/or transfers;A little help with bathing/dressing/bathroom;Assist for transportation;Assistance with cooking/housework   Equipment Recommendations  BSC/3in1;Other (comment);Tub/shower bench    Recommendations for Other Services      Precautions / Restrictions Precautions Precautions: Fall Restrictions Weight Bearing Restrictions: No LLE Weight Bearing: Weight bearing as tolerated       Mobility Bed Mobility Overal bed mobility: Needs Assistance Bed Mobility: Sit to Supine     Supine to sit: HOB elevated, Min assist Sit to supine: HOB elevated, Min guard   General bed mobility comments: verbal cues for self assist, required increased time    Transfers                         Balance                                           ADL either performed or assessed  with clinical judgement   ADL Overall ADL's : Needs assistance/impaired                     Lower Body Dressing: Sit to/from stand;Sitting/lateral leans Lower Body Dressing Details (indicate cue type and reason): Patient was educated on AE for LB dressing and bathing tasks. patient was able to don simulated pants from feet to knees with S sitting EOB with reacher. patient reported having purchased a Sports administrator on this date. patient was educated on sock aid with patient able to don sock with S and doff sock with reacher with S. Toilet Transfer: Min guard;Rolling walker (2 wheels);Ambulation Toilet Transfer Details (indicate cue type and reason): to bathroom with noted incontinence episode with transition from edge of bed to bathroom. patient noted to call out with pain with backing up LLE to toilet. Toileting- Clothing Manipulation and Hygiene: Set up;Min guard;Sitting/lateral lean;Sit to/from stand Toileting - Clothing Manipulation Details (indicate cue type and reason): with increased time.            Extremity/Trunk Assessment              Vision       Perception     Praxis      Cognition Arousal/Alertness: Awake/alert Behavior During Therapy: WFL for tasks assessed/performed Overall Cognitive Status: Within Functional Limits for tasks assessed  Exercises      Shoulder Instructions       General Comments      Pertinent Vitals/ Pain       Pain Assessment Pain Assessment: 0-10 Pain Location: LT hip especially with hip flexion Pain Descriptors / Indicators: Sore, Guarding, Grimacing Pain Intervention(s): Monitored during session, Premedicated before session, Repositioned  Home Living                                          Prior Functioning/Environment              Frequency  Min 3X/week        Progress Toward Goals  OT Goals(current goals can now be found in the  care plan section)  Progress towards OT goals: Progressing toward goals     Plan Discharge plan remains appropriate    Co-evaluation                 AM-PAC OT "6 Clicks" Daily Activity     Outcome Measure   Help from another person eating meals?: None Help from another person taking care of personal grooming?: A Little Help from another person toileting, which includes using toliet, bedpan, or urinal?: A Little Help from another person bathing (including washing, rinsing, drying)?: A Little Help from another person to put on and taking off regular upper body clothing?: A Little Help from another person to put on and taking off regular lower body clothing?: A Little 6 Click Score: 19    End of Session Equipment Utilized During Treatment: Gait belt;Rolling walker (2 wheels)  OT Visit Diagnosis: Unsteadiness on feet (R26.81);History of falling (Z91.81);Pain Pain - Right/Left: Left Pain - part of body: Hip   Activity Tolerance Patient tolerated treatment well   Patient Left with call bell/phone within reach;with nursing/sitter in room (with PT for PT session)   Nurse Communication Mobility status        Time: 1411-1440 OT Time Calculation (min): 29 min  Charges: OT General Charges $OT Visit: 1 Visit OT Treatments $Self Care/Home Management : 23-37 mins  Sharyn Blitz OTR/L, MS Acute Rehabilitation Department Office# [phone] Pager# [phone]   Ardyth Harps 02/26/2022, 3:53 PM

## 2022-02-27 DIAGNOSIS — S72001A Fracture of unspecified part of neck of right femur, initial encounter for closed fracture: Secondary | ICD-10-CM | POA: Diagnosis not present

## 2022-02-27 DIAGNOSIS — E876 Hypokalemia: Secondary | ICD-10-CM | POA: Diagnosis not present

## 2022-02-27 LAB — BASIC METABOLIC PANEL
Anion gap: 6 (ref 5–15)
BUN: 15 mg/dL (ref 8–23)
CO2: 27 mmol/L (ref 22–32)
Calcium: 8.7 mg/dL — ABNORMAL LOW (ref 8.9–10.3)
Chloride: 107 mmol/L (ref 98–111)
Creatinine, Ser: 0.65 mg/dL (ref 0.44–1.00)
GFR, Estimated: >60 mL/min (ref >60)
Glucose, Bld: 85 mg/dL (ref 70–99)
Potassium: 3.6 mmol/L (ref 3.5–5.1)
Sodium: 140 mmol/L (ref 135–145)

## 2022-02-27 LAB — CBC
HCT: 30 % — ABNORMAL LOW (ref 36.0–46.0)
Hemoglobin: 10 g/dL — ABNORMAL LOW (ref 12.0–15.0)
MCH: 31.7 pg (ref 26.0–34.0)
MCHC: 33.3 g/dL (ref 30.0–36.0)
MCV: 95.2 fL (ref 80.0–100.0)
Platelets: 197 10*3/uL (ref 150–400)
RBC: 3.15 MIL/uL — ABNORMAL LOW (ref 3.87–5.11)
RDW: 13.4 % (ref 11.5–15.5)
WBC: 7 10*3/uL (ref 4.0–10.5)
nRBC: 0 % (ref 0.0–0.2)

## 2022-02-27 LAB — MAGNESIUM: Magnesium: 1.6 mg/dL — ABNORMAL LOW (ref 1.7–2.4)

## 2022-02-27 LAB — VITAMIN D 25 HYDROXY (VIT D DEFICIENCY, FRACTURES): Vit D, 25-Hydroxy: 28.89 ng/mL — ABNORMAL LOW (ref 30–100)

## 2022-02-27 MED ORDER — MAGNESIUM SULFATE 2 GM/50ML IV SOLN
2.0000 g | Freq: Once | INTRAVENOUS | Status: AC
  Administered 2022-02-27: 2 g via INTRAVENOUS
  Filled 2022-02-27: qty 50

## 2022-02-27 MED ORDER — POTASSIUM CHLORIDE 20 MEQ PO PACK
40.0000 meq | PACK | Freq: Once | ORAL | Status: AC
  Administered 2022-02-27: 40 meq via ORAL
  Filled 2022-02-27: qty 2

## 2022-02-27 MED ORDER — VITAMIN D 25 MCG (1000 UNIT) PO TABS
1000.0000 [IU] | ORAL_TABLET | Freq: Every day | ORAL | Status: DC
Start: 2022-02-27 — End: 2022-02-28
  Administered 2022-02-27 – 2022-02-28 (×2): 1000 [IU] via ORAL
  Filled 2022-02-27 (×2): qty 1

## 2022-02-27 NOTE — Progress Notes (Signed)
  Progress Note   Patient: Kara Acevedo [id number]:[id number] DOB: [birth year] DOA: 02/22/2022     4 DOS: the patient was seen and examined on 02/27/2022 at 12:00 PM      Brief hospital course: Kara Acevedo is a 66 y.o. F with HTN, ADHD who presented with hip fracture.   7/16: Admitted, Ortho deferred to weekday service 7/17: To the OR for IM nail by Dr. Veda Canning     Assessment and Plan:  * Hip fracture [facility]) S/p Intramedullary fixation, Left femur on 7/17 by Dr. Veda Canning - WBAT with walker - Lovenox for DVT ppx while in house, aspirin 81 bid after discharge - PT eval recommended home PT OT - low vit d , started supplement   Rib pain   a rib x-ray was initially considered but not obtained as  patient feels this is improving  F/u with pcp  Essential hypertension Bp low normal without home bp meds Continue home home bp meds  amlodipine, indapamide for now  Hypokalemia/hypomagnesemia Remain low , continue replace , recheck in the morning  -Encourage oral intake  ADHD - adderall held since admission    Morbid obesity (HCC) BMI 35.5 with hypertension, osteoarthritis. On semaglutide at home for weight management  Reports h/o weight loss surgery in 2014, has tendency to have electrolyte abnormalities       Subjective:  She does not feel she can go home today, she reports pain is not well controlled Reports poor appetite  Reports narcotic cause itching but ok taken together with benadryl    Physical Exam: Vitals:   02/26/22 1333 02/26/22 1728 02/26/22 2138 02/27/22 0546  BP: 115/65 102/83 (!) 143/72 110/60  Pulse: 75 75 84 74  Resp: 14 14 18 14   Temp: 98 F (36.7 C) 98.8 F (37.1 C) 98.2 F (36.8 C) 98.4 F (36.9 C)  TempSrc: Oral Oral Oral Oral  SpO2: 100% (!) 80% 96% 97%  Weight:      Height:       Adult female, lying in bed, no acute distress, interactive and appropriate RRR, no murmurs, no peripheral edema Respiratory rate normal, lungs  clear without rales or wheezes Abdomen soft no tenderness palpation or guarding Dressings in place, clean dry and intact, no significant surrounding bruising or bleeding or discharge Attention normal, affect normal, judgment and insight appear normal, face symmetric, speech fluent, oriented to person, place, time, and situation, moves upper extremities with normal strength and coordination    Data Reviewed: Basic metabolic panel shows normal sodium and creatinine, potassium down to 3 Hemogram shows hemoglobin down to 11  Family Communication: Spoke with Kara Acevedo by phone    Disposition: Status is: Inpatient Admitted with hip fracture, underwent uncomplicted IM nail on 7/17, hopefully home with home health on 7/20           Author: 8/20, MD PhD FACP 02/27/2022 8:20 AM  For on call review [email or web address].

## 2022-02-27 NOTE — Care Management (Signed)
Patient suffers from a hip fracture, which impairs their ability to perform daily activities like toileting, dressing, and bathing in the home. A walker will not resolve issue performing activities of daily living. A wheelchair will allow patient to safely perform daily activities. Patient can safely propel the wheelchair in the home or has a caregiver who can provide assistance.      Durable Medical Equipment  (From admission, onward)           Start     Ordered   02/27/22 1441  For home use only DME lightweight manual wheelchair with seat cushion  Once       Comments: Patient suffers from FTT which impairs their ability to perform daily activities like bathing, dressing, and toileting in the home.  A walker will not resolve  issue with performing activities of daily living. A wheelchair will allow patient to safely perform daily activities. Patient is not able to propel themselves in the home using a standard weight wheelchair due to general weakness. Patient can self propel in the lightweight wheelchair. Length of need 6 months . Accessories: elevating leg rests (ELRs), wheel locks, extensions and anti-tippers.   02/27/22 1441   02/25/22 1524  DME 3-in-1  (Discharge Planning)  Once        02/25/22 1524   02/25/22 1524  DME Walker rolling  (Discharge Planning)  Once       Question Answer Comment  Walker: With 5 Inch Wheels   Patient needs a walker to treat with the following condition Hip fracture (HCC)      02/25/22 1524
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
## 2022-02-28 DIAGNOSIS — S72001A Fracture of unspecified part of neck of right femur, initial encounter for closed fracture: Secondary | ICD-10-CM | POA: Diagnosis not present

## 2022-02-28 DIAGNOSIS — E876 Hypokalemia: Secondary | ICD-10-CM | POA: Diagnosis not present

## 2022-02-28 LAB — BASIC METABOLIC PANEL
Anion gap: 6 (ref 5–15)
BUN: 17 mg/dL (ref 8–23)
CO2: 28 mmol/L (ref 22–32)
Calcium: 8.4 mg/dL — ABNORMAL LOW (ref 8.9–10.3)
Chloride: 107 mmol/L (ref 98–111)
Creatinine, Ser: 0.52 mg/dL (ref 0.44–1.00)
GFR, Estimated: >60 mL/min (ref >60)
Glucose, Bld: 98 mg/dL (ref 70–99)
Potassium: 3.8 mmol/L (ref 3.5–5.1)
Sodium: 141 mmol/L (ref 135–145)

## 2022-02-28 LAB — TSH: TSH: 1.64 u[IU]/mL (ref 0.350–4.500)

## 2022-02-28 LAB — MAGNESIUM: Magnesium: 1.8 mg/dL (ref 1.7–2.4)

## 2022-02-28 MED ORDER — POLYETHYLENE GLYCOL 3350 17 G PO PACK
17.0000 g | PACK | Freq: Two times a day (BID) | ORAL | Status: DC
Start: 2022-02-28 — End: 2022-02-28
  Administered 2022-02-28: 17 g via ORAL
  Filled 2022-02-28: qty 1

## 2022-02-28 MED ORDER — MAGNESIUM OXIDE -MG SUPPLEMENT 400 (240 MG) MG PO TABS
400.0000 mg | ORAL_TABLET | Freq: Every day | ORAL | Status: DC
  Administered 2022-02-28: 400 mg via ORAL
  Filled 2022-02-28: qty 1

## 2022-02-28 MED ORDER — INDAPAMIDE 2.5 MG PO TABS: 2.5000 mg | ORAL_TABLET | Freq: Every day | ORAL | Status: DC | PRN

## 2022-02-28 MED ORDER — POTASSIUM CHLORIDE 20 MEQ PO PACK
40.0000 meq | PACK | Freq: Once | ORAL | Status: AC
  Administered 2022-02-28: 40 meq via ORAL
  Filled 2022-02-28: qty 2

## 2022-02-28 MED ORDER — AMLODIPINE BESYLATE 5 MG PO TABS: 5.0000 mg | ORAL_TABLET | Freq: Every day | ORAL | Status: DC
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
## 2022-02-28 NOTE — Progress Notes (Signed)
The patient is alert and oriented and has been seen by her physician. The orders for discharge were written. IV has been removed. Went over discharge instructions with patient and family. She is being discharged via wheelchair with all of her belongings.  

## 2022-02-28 NOTE — TOC Transition Note (Signed)
Transition of Care [facility]) - CM/SW Discharge Note  Patient Details  Name: Kara Acevedo MRN: [id number] Date of Birth: February 03, 1956  [facility] CM/SW Contact:  Ewing Schlein, LCSW Phone Number: 02/28/2022, 9:51 AM  Clinical Narrative: CSW confirmed with [name] with [employer] that the wheelchair will be delivered to the patient's daughter's apartment today. CSW updated patient. TOC signing off.  Final next level of care: Home w Home Health Services Barriers to Discharge: Barriers Resolved  Patient Goals and CMS Choice Patient states their goals for this hospitalization and ongoing recovery are:: Return home with [facility] CMS Medicare.gov Compare Post Acute Care list provided to:: Patient Choice offered to / list presented to : Patient  Discharge Plan and Services In-house Referral: Clinical Social Work Post Acute Care Choice: Horticulturist, commercial, Home Health          DME Arranged: 3-N-1, Walker rolling, Wheelchair manual DME Agency: [employer] Date DME Agency Contacted: 02/25/22 Representative spoke with at DME Agency: [name] HH Arranged: PT, OT HH Agency: [facility] Date [facility] Agency Contacted: 02/25/22 Time HH Agency Contacted: [phone] Representative spoke with at [facility] Agency: Cindie  Readmission Risk Interventions     No data to display
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
## 2022-02-28 NOTE — Care Management Important Message (Signed)
Important Message  Patient Details IM Letter given to the Patient. Name: Selisa Tensley MRN: [id number] Date of Birth: 1956/02/28   Medicare Important Message Given:  Yes     Caren Macadam 02/28/2022, 11:19 AM

## 2024-02-26 ENCOUNTER — Emergency Department (HOSPITAL_COMMUNITY): Payer: Self-pay

## 2024-02-26 ENCOUNTER — Emergency Department (HOSPITAL_COMMUNITY)
Admission: EM | Admit: 2024-02-26 | Discharge: 2024-02-26 | Disposition: A | Discharge status: Home / Self Care | Payer: Self-pay | Attending: Emergency Medicine | Admitting: Emergency Medicine

## 2024-02-26 ENCOUNTER — Encounter (HOSPITAL_COMMUNITY): Payer: Self-pay

## 2024-02-26 ENCOUNTER — Other Ambulatory Visit: Payer: Self-pay

## 2024-02-26 DIAGNOSIS — Z91040 Latex allergy status: Secondary | ICD-10-CM | POA: Diagnosis not present

## 2024-02-26 DIAGNOSIS — S2241XA Multiple fractures of ribs, right side, initial encounter for closed fracture: Secondary | ICD-10-CM | POA: Diagnosis not present

## 2024-02-26 DIAGNOSIS — S0990XA Unspecified injury of head, initial encounter: Secondary | ICD-10-CM | POA: Diagnosis not present

## 2024-02-26 DIAGNOSIS — M79643 Pain in unspecified hand: Secondary | ICD-10-CM

## 2024-02-26 DIAGNOSIS — Y92410 Unspecified street and highway as the place of occurrence of the external cause: Secondary | ICD-10-CM | POA: Diagnosis not present

## 2024-02-26 DIAGNOSIS — S3991XA Unspecified injury of abdomen, initial encounter: Secondary | ICD-10-CM | POA: Diagnosis not present

## 2024-02-26 DIAGNOSIS — M25532 Pain in left wrist: Secondary | ICD-10-CM | POA: Diagnosis not present

## 2024-02-26 DIAGNOSIS — S299XXA Unspecified injury of thorax, initial encounter: Secondary | ICD-10-CM | POA: Diagnosis present

## 2024-02-26 DIAGNOSIS — Z794 Long term (current) use of insulin: Secondary | ICD-10-CM | POA: Insufficient documentation

## 2024-02-26 LAB — COMPREHENSIVE METABOLIC PANEL WITH GFR
ALT: 15 U/L (ref 0–44)
AST: 25 U/L (ref 15–41)
Albumin: 3.7 g/dL (ref 3.5–5.0)
Alkaline Phosphatase: 63 U/L (ref 38–126)
Anion gap: 10 (ref 5–15)
BUN: 20 mg/dL (ref 8–23)
CO2: 22 mmol/L (ref 22–32)
Calcium: 9.5 mg/dL (ref 8.9–10.3)
Chloride: 107 mmol/L (ref 98–111)
Creatinine, Ser: 0.84 mg/dL (ref 0.44–1.00)
GFR, Estimated: >60 mL/min (ref >60)
Glucose, Bld: 90 mg/dL (ref 70–99)
Potassium: 3.2 mmol/L — ABNORMAL LOW (ref 3.5–5.1)
Sodium: 139 mmol/L (ref 135–145)
Total Bilirubin: 1.1 mg/dL (ref 0.0–1.2)
Total Protein: 7 g/dL (ref 6.5–8.1)

## 2024-02-26 LAB — CBC WITH DIFFERENTIAL/PLATELET
Abs Immature Granulocytes: 0.02 K/uL (ref 0.00–0.07)
Basophils Absolute: 0 K/uL (ref 0.0–0.1)
Basophils Relative: 1 %
Eosinophils Absolute: 0.1 K/uL (ref 0.0–0.5)
Eosinophils Relative: 2 %
HCT: 40.6 % (ref 36.0–46.0)
Hemoglobin: 13.9 g/dL (ref 12.0–15.0)
Immature Granulocytes: 0 %
Lymphocytes Relative: 34 %
Lymphs Abs: 2.2 K/uL (ref 0.7–4.0)
MCH: 30.8 pg (ref 26.0–34.0)
MCHC: 34.2 g/dL (ref 30.0–36.0)
MCV: 89.8 fL (ref 80.0–100.0)
Monocytes Absolute: 0.5 K/uL (ref 0.1–1.0)
Monocytes Relative: 9 %
Neutro Abs: 3.5 K/uL (ref 1.7–7.7)
Neutrophils Relative %: 54 %
Platelets: 277 K/uL (ref 150–400)
RBC: 4.52 MIL/uL (ref 3.87–5.11)
RDW: 13.5 % (ref 11.5–15.5)
WBC: 6.4 K/uL (ref 4.0–10.5)
nRBC: 0 % (ref 0.0–0.2)

## 2024-02-26 LAB — I-STAT CHEM 8, ED
BUN: 20 mg/dL (ref 8–23)
Calcium, Ion: 1.16 mmol/L (ref 1.15–1.40)
Chloride: 106 mmol/L (ref 98–111)
Creatinine, Ser: 1 mg/dL (ref 0.44–1.00)
Glucose, Bld: 93 mg/dL (ref 70–99)
HCT: 40 % (ref 36.0–46.0)
Hemoglobin: 13.6 g/dL (ref 12.0–15.0)
Potassium: 3.2 mmol/L — ABNORMAL LOW (ref 3.5–5.1)
Sodium: 141 mmol/L (ref 135–145)
TCO2: 24 mmol/L (ref 22–32)

## 2024-02-26 MED ORDER — MORPHINE SULFATE 15 MG PO TABS
7.5000 mg | ORAL_TABLET | ORAL | 0 refills | Status: DC | PRN
Start: 2024-02-26 — End: 2024-04-18

## 2024-02-26 MED ORDER — IOHEXOL 350 MG/ML SOLN
75.0000 mL | Freq: Once | INTRAVENOUS | Status: AC | PRN
  Administered 2024-02-26: 75 mL via INTRAVENOUS

## 2024-02-26 MED ORDER — KETOROLAC TROMETHAMINE 15 MG/ML IJ SOLN
15.0000 mg | Freq: Once | INTRAMUSCULAR | Status: AC
  Administered 2024-02-26: 15 mg via INTRAVENOUS
  Filled 2024-02-26: qty 1

## 2024-02-26 MED ORDER — OXYCODONE HCL 5 MG PO TABS
5.0000 mg | ORAL_TABLET | Freq: Once | ORAL | Status: AC
  Administered 2024-02-26: 5 mg via ORAL
  Filled 2024-02-26: qty 1

## 2024-02-26 MED ORDER — ACETAMINOPHEN 500 MG PO TABS
1000.0000 mg | ORAL_TABLET | Freq: Once | ORAL | Status: AC
  Administered 2024-02-26: 1000 mg via ORAL
  Filled 2024-02-26: qty 2
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
## 2024-02-26 NOTE — Discharge Instructions (Signed)
 Incentive spirometry 10 min out of every hour that you are awake.  The little pillows on the couch are usually helpful with this as well you can hold them against your chest when you need to take a big deep breath cough or sneeze.  Please follow-up with your hand surgeon in the office as well.  Please follow-up with your family doctor in the office.  Please return for worsening pain difficulty breathing fever.  Take 4 over the counter ibuprofen tablets 3 times a day or 2 over-the-counter naproxen tablets twice a day for pain. Also take tylenol  1000mg (2 extra strength) four times a day.   Then take the pain medicine if you feel like you need it. Narcotics do not help with the pain, they only make you care about it less.  You can become addicted to this, people may break into your house to steal it.  It will constipate you.  If you drive under the influence of this medicine you can get a DUI.
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
## 2024-04-12 ENCOUNTER — Emergency Department (HOSPITAL_COMMUNITY)

## 2024-04-12 ENCOUNTER — Inpatient Hospital Stay (HOSPITAL_COMMUNITY)
Admission: EM | Admit: 2024-04-12 | Discharge: 2024-04-18 | DRG: 481 | Disposition: A | Discharge status: Home Health | Attending: Internal Medicine | Admitting: Internal Medicine

## 2024-04-12 ENCOUNTER — Inpatient Hospital Stay (HOSPITAL_COMMUNITY)

## 2024-04-12 ENCOUNTER — Encounter (HOSPITAL_COMMUNITY): Payer: Self-pay

## 2024-04-12 ENCOUNTER — Other Ambulatory Visit: Payer: Self-pay

## 2024-04-12 DIAGNOSIS — Z7401 Bed confinement status: Secondary | ICD-10-CM | POA: Diagnosis not present

## 2024-04-12 DIAGNOSIS — Z881 Allergy status to other antibiotic agents status: Secondary | ICD-10-CM

## 2024-04-12 DIAGNOSIS — F909 Attention-deficit hyperactivity disorder, unspecified type: Secondary | ICD-10-CM | POA: Diagnosis present

## 2024-04-12 DIAGNOSIS — S7292XA Unspecified fracture of left femur, initial encounter for closed fracture: Secondary | ICD-10-CM | POA: Diagnosis present

## 2024-04-12 DIAGNOSIS — K5900 Constipation, unspecified: Secondary | ICD-10-CM | POA: Diagnosis not present

## 2024-04-12 DIAGNOSIS — M25562 Pain in left knee: Secondary | ICD-10-CM | POA: Diagnosis not present

## 2024-04-12 DIAGNOSIS — S72452A Displaced supracondylar fracture without intracondylar extension of lower end of left femur, initial encounter for closed fracture: Secondary | ICD-10-CM | POA: Diagnosis present

## 2024-04-12 DIAGNOSIS — W010XXA Fall on same level from slipping, tripping and stumbling without subsequent striking against object, initial encounter: Secondary | ICD-10-CM | POA: Diagnosis present

## 2024-04-12 DIAGNOSIS — S72402A Unspecified fracture of lower end of left femur, initial encounter for closed fracture: Secondary | ICD-10-CM | POA: Diagnosis not present

## 2024-04-12 DIAGNOSIS — E876 Hypokalemia: Secondary | ICD-10-CM | POA: Diagnosis present

## 2024-04-12 DIAGNOSIS — Z9884 Bariatric surgery status: Secondary | ICD-10-CM | POA: Diagnosis not present

## 2024-04-12 DIAGNOSIS — G629 Polyneuropathy, unspecified: Secondary | ICD-10-CM | POA: Diagnosis present

## 2024-04-12 DIAGNOSIS — Z6835 Body mass index (BMI) 35.0-35.9, adult: Secondary | ICD-10-CM

## 2024-04-12 DIAGNOSIS — Z888 Allergy status to other drugs, medicaments and biological substances status: Secondary | ICD-10-CM

## 2024-04-12 DIAGNOSIS — D62 Acute posthemorrhagic anemia: Secondary | ICD-10-CM | POA: Diagnosis not present

## 2024-04-12 DIAGNOSIS — Z882 Allergy status to sulfonamides status: Secondary | ICD-10-CM

## 2024-04-12 DIAGNOSIS — M9712XA Periprosthetic fracture around internal prosthetic left knee joint, initial encounter: Secondary | ICD-10-CM | POA: Diagnosis present

## 2024-04-12 DIAGNOSIS — E66812 Obesity, class 2: Secondary | ICD-10-CM | POA: Diagnosis present

## 2024-04-12 DIAGNOSIS — Z96649 Presence of unspecified artificial hip joint: Secondary | ICD-10-CM | POA: Diagnosis not present

## 2024-04-12 DIAGNOSIS — Z79899 Other long term (current) drug therapy: Secondary | ICD-10-CM | POA: Diagnosis not present

## 2024-04-12 DIAGNOSIS — I10 Essential (primary) hypertension: Secondary | ICD-10-CM | POA: Diagnosis present

## 2024-04-12 DIAGNOSIS — M978XXA Periprosthetic fracture around other internal prosthetic joint, initial encounter: Principal | ICD-10-CM | POA: Diagnosis present

## 2024-04-12 DIAGNOSIS — Z91040 Latex allergy status: Secondary | ICD-10-CM

## 2024-04-12 DIAGNOSIS — W19XXXA Unspecified fall, initial encounter: Secondary | ICD-10-CM | POA: Diagnosis not present

## 2024-04-12 LAB — BASIC METABOLIC PANEL WITH GFR
Anion gap: 12 (ref 5–15)
BUN: 11 mg/dL (ref 8–23)
CO2: 21 mmol/L — ABNORMAL LOW (ref 22–32)
Calcium: 9.1 mg/dL (ref 8.9–10.3)
Chloride: 107 mmol/L (ref 98–111)
Creatinine, Ser: 0.8 mg/dL (ref 0.44–1.00)
GFR, Estimated: >60 mL/min (ref >60)
Glucose, Bld: 119 mg/dL — ABNORMAL HIGH (ref 70–99)
Potassium: 3.7 mmol/L (ref 3.5–5.1)
Sodium: 140 mmol/L (ref 135–145)

## 2024-04-12 LAB — CBC
HCT: 39 % (ref 36.0–46.0)
Hemoglobin: 13.1 g/dL (ref 12.0–15.0)
MCH: 31.3 pg (ref 26.0–34.0)
MCHC: 33.6 g/dL (ref 30.0–36.0)
MCV: 93.3 fL (ref 80.0–100.0)
Platelets: 239 K/uL (ref 150–400)
RBC: 4.18 MIL/uL (ref 3.87–5.11)
RDW: 13.1 % (ref 11.5–15.5)
WBC: 5.8 K/uL (ref 4.0–10.5)
nRBC: 0 % (ref 0.0–0.2)

## 2024-04-12 LAB — HEPATIC FUNCTION PANEL
ALT: 14 U/L (ref 0–44)
AST: 17 U/L (ref 15–41)
Albumin: 2.8 g/dL — ABNORMAL LOW (ref 3.5–5.0)
Alkaline Phosphatase: 53 U/L (ref 38–126)
Bilirubin, Direct: 0.1 mg/dL (ref 0.0–0.2)
Indirect Bilirubin: 0.4 mg/dL (ref 0.3–0.9)
Total Bilirubin: 0.5 mg/dL (ref 0.0–1.2)
Total Protein: 5.7 g/dL — ABNORMAL LOW (ref 6.5–8.1)

## 2024-04-12 LAB — I-STAT CHEM 8, ED
BUN: 14 mg/dL (ref 8–23)
Calcium, Ion: 1.1 mmol/L — ABNORMAL LOW (ref 1.15–1.40)
Chloride: 108 mmol/L (ref 98–111)
Creatinine, Ser: 0.8 mg/dL (ref 0.44–1.00)
Glucose, Bld: 109 mg/dL — ABNORMAL HIGH (ref 70–99)
HCT: 37 % (ref 36.0–46.0)
Hemoglobin: 12.6 g/dL (ref 12.0–15.0)
Potassium: 4.4 mmol/L (ref 3.5–5.1)
Sodium: 140 mmol/L (ref 135–145)
TCO2: 23 mmol/L (ref 22–32)

## 2024-04-12 LAB — CK: Total CK: 89 U/L (ref 38–234)

## 2024-04-12 LAB — MAGNESIUM: Magnesium: 1.6 mg/dL — ABNORMAL LOW (ref 1.7–2.4)

## 2024-04-12 LAB — PHOSPHORUS: Phosphorus: 3.9 mg/dL (ref 2.5–4.6)

## 2024-04-12 MED ORDER — HYDROMORPHONE HCL 1 MG/ML IJ SOLN
1.0000 mg | Freq: Once | INTRAMUSCULAR | Status: AC
  Administered 2024-04-12: 1 mg via INTRAVENOUS
  Filled 2024-04-12: qty 1

## 2024-04-12 MED ORDER — HYDROCODONE-ACETAMINOPHEN 5-325 MG PO TABS: 1.0000 | ORAL_TABLET | Freq: Four times a day (QID) | ORAL | Status: DC | PRN

## 2024-04-12 MED ORDER — HYDROMORPHONE HCL 1 MG/ML IJ SOLN
0.5000 mg | Freq: Once | INTRAMUSCULAR | Status: AC
  Administered 2024-04-12: 0.5 mg via INTRAVENOUS
  Filled 2024-04-12: qty 1

## 2024-04-12 MED ORDER — AMLODIPINE BESYLATE 5 MG PO TABS
5.0000 mg | ORAL_TABLET | Freq: Every day | ORAL | Status: DC
  Administered 2024-04-13: 5 mg via ORAL
  Filled 2024-04-12: qty 1

## 2024-04-12 MED ORDER — HYDROMORPHONE HCL 1 MG/ML IJ SOLN
0.5000 mg | INTRAMUSCULAR | Status: DC | PRN
  Administered 2024-04-13 (×4): 1 mg via INTRAVENOUS
  Filled 2024-04-12 (×4): qty 1

## 2024-04-12 MED ORDER — METHOCARBAMOL 1000 MG/10ML IJ SOLN: 500.0000 mg | Freq: Four times a day (QID) | INTRAMUSCULAR | Status: DC | PRN

## 2024-04-12 MED ORDER — LACTATED RINGERS IV BOLUS
500.0000 mL | Freq: Once | INTRAVENOUS | Status: AC
Start: 2024-04-12 — End: 2024-04-12
  Administered 2024-04-12: 500 mL via INTRAVENOUS

## 2024-04-12 MED ORDER — IOHEXOL 350 MG/ML SOLN
100.0000 mL | Freq: Once | INTRAVENOUS | Status: AC | PRN
  Administered 2024-04-12: 100 mL via INTRAVENOUS

## 2024-04-12 MED ORDER — ONDANSETRON 4 MG PO TBDP
4.0000 mg | ORAL_TABLET | Freq: Once | ORAL | Status: AC
  Administered 2024-04-12: 4 mg via ORAL
  Filled 2024-04-12: qty 1

## 2024-04-12 MED ORDER — METHOCARBAMOL 500 MG PO TABS
500.0000 mg | ORAL_TABLET | Freq: Four times a day (QID) | ORAL | Status: DC | PRN
  Administered 2024-04-13 – 2024-04-18 (×6): 500 mg via ORAL
  Filled 2024-04-12 (×6): qty 1

## 2024-04-12 NOTE — Subjective & Objective (Signed)
 Had a mechanical fall at home  Reports left knee hip pain She is slipped on the floor Landed in a split She have had recent knee surgeries and recent left femur fracture and repair No history of head injury no LOC no neck pain no back pain no chest pain Tolerated prior procedures well Not on blood thinners Patient has history of hypertension for which she takes Norvasc 

## 2024-04-12 NOTE — ED Provider Notes (Signed)
 [place] EMERGENCY DEPARTMENT AT [facility] Provider Note   CSN: [id number] Arrival date & time: 04/12/24  1049     Patient presents with: Kara Acevedo is a 68 y.o. female with history of arthritis, hypertension presents emerged from today for evaluation after slip and fall.  Patient reports that she slipped on something in her kitchen causing her left leg to go behind her sitting on and her right leg to go out in front of her.  She denies hitting her head.  She denies any head pain, neck pain, back pain, abdominal pain, or chest pain.  Is only having pain from her left femur, left knee, and proximal left lower leg.  She denies any numbness or tingling.  Reports that she has had surgery on the knee and hip previously.  Denies any blood thinner use.   Fall Pertinent negatives include no chest pain, no abdominal pain and no headaches.       Prior to Admission medications   Medication Sig Start Date End Date Taking? Authorizing Provider  amLODipine  (NORVASC ) 5 MG tablet Take 1 tablet (5 mg total) by mouth daily. Please hold this medication if your systolic blood pressure ( top number) is less than 100. 02/28/22   Jerri Keys, MD  amphetamine-dextroamphetamine (ADDERALL) 15 MG tablet Take 15 mg by mouth daily.     [provider]  ibuprofen (ADVIL,MOTRIN) 800 MG tablet Take 800 mg by mouth 3 (three) times daily as needed for moderate pain.    [provider]  indapamide  (LOZOL ) 2.5 MG tablet Take 1 tablet (2.5 mg total) by mouth daily as needed. edema 02/28/22   Jerri Keys, MD  morphine  (MSIR) 15 MG tablet Take 0.5 tablets (7.5 mg total) by mouth every 4 (four) hours as needed. 02/26/24   Emil Share, DO  Semaglutide-Weight Management (WEGOVY) 2.4 MG/0.75ML SOAJ Inject 2.4 mg into the skin once a week.    [provider]    Allergies: Doxycycline, Latex, Sulfa antibiotics, Fluconazole, and Ace inhibitors    Review of Systems   Constitutional:  Negative for chills and fever.  Cardiovascular:  Negative for chest pain.  Gastrointestinal:  Negative for abdominal pain.  Musculoskeletal:  Positive for arthralgias and myalgias. Negative for back pain and neck pain.  Neurological:  Negative for headaches.    Updated Vital Signs BP 139/77 (BP Location: Left Arm)   Pulse 71   Temp (!) 97.3 F (36.3 C) (Oral)   Resp (!) 21   Ht 5' 5 (1.651 m)   Wt 97.5 kg   SpO2 100%   BMI 35.78 kg/m   Physical Exam Vitals and nursing note reviewed.  Constitutional:      Appearance: She is not toxic-appearing.     Comments: Uncomfortable but nontoxic-appearing  Eyes:     General: No scleral icterus. Cardiovascular:     Rate and Rhythm: Normal rate.  Pulmonary:     Effort: Pulmonary effort is normal. No respiratory distress.  Abdominal:     Palpations: Abdomen is soft.     Tenderness: There is no abdominal tenderness. There is no guarding or rebound.  Musculoskeletal:        General: Tenderness present.     Comments: No real tenderness to the left hip.  Tenderness more to the distal femur/knee/proximal tibia.  No tenderness of the lower leg, ankle, or foot.  She has a 1+ palpable pulse on the left and 2+ on the right.  She  is still able to wiggle her toes.  Coloration, temperature, and size appear and feel symmetric bilaterally.  Compartments are soft.  No open skin tears.  Patient is laying right lateral decubitus.  Skin:    General: Skin is warm and dry.  Neurological:     Mental Status: She is alert.     (all labs ordered are listed, but only abnormal results are displayed) Labs Reviewed  BASIC METABOLIC PANEL WITH GFR - Abnormal; Notable for the following components:      Result Value   CO2 21 (*)    Glucose, Bld 119 (*)    All other components within normal limits  I-STAT CHEM 8, ED - Abnormal; Notable for the following components:   Glucose, Bld 109 (*)    Calcium, Ion 1.10 (*)    All other components  within normal limits  CBC    EKG: None  Radiology: DG Hip Unilat W or Wo Pelvis 2-3 Views Left Result Date: 04/12/2024 CLINICAL DATA:  Left knee pain after fall. EXAM: DG HIP (WITH OR WITHOUT PELVIS) 2-3V LEFT COMPARISON:  February 24, 2022. FINDINGS: Status post surgical internal fixation proximal left femoral intrauterine fracture. No acute fracture or dislocation is noted. IMPRESSION: No acute abnormality seen. Electronically Signed   By: Lynwood Landy Raddle M.D.   On: 04/12/2024 12:10   DG Femur 1 View Left Result Date: 04/12/2024 CLINICAL DATA:  Left knee pain after fall. EXAM: LEFT FEMUR 1 VIEW COMPARISON:  February 24, 2022. FINDINGS: Status post surgical internal fixation of proximal left femoral fracture. Status post left total knee arthroplasty. Severely displaced and possibly comminuted periprosthetic fracture of distal left femur is noted. IMPRESSION: Severely displaced and possibly comminuted distal left femoral periprosthetic fracture. Electronically Signed   By: Lynwood Landy Raddle M.D.   On: 04/12/2024 12:08   DG Knee 1-2 Views Left Result Date: 04/12/2024 CLINICAL DATA:  Left knee pain after fall. EXAM: LEFT KNEE - 1-2 VIEW COMPARISON:  February 23, 2022. FINDINGS: Status post left total knee arthroplasty. Severely displaced and possibly comminuted periprosthetic fracture is seen involving the distal left femur. IMPRESSION: Severely displaced and possibly comminuted periprosthetic fracture involving distal left femur. Electronically Signed   By: Lynwood Landy Raddle M.D.   On: 04/12/2024 12:05   Procedures   Medications Ordered in the ED  HYDROmorphone  (DILAUDID ) injection 1 mg (1 mg Intravenous Given 04/12/24 1207)  ondansetron  (ZOFRAN -ODT) disintegrating tablet 4 mg (4 mg Oral Given 04/12/24 1228)  iohexol  (OMNIPAQUE ) 350 MG/ML injection 100 mL (100 mLs Intravenous Contrast Given 04/12/24 1530)  HYDROmorphone  (DILAUDID ) injection 1 mg (1 mg Intravenous Given 04/12/24 1516)    Clinical Course as of 04/12/24  1545  Tue Apr 12, 2024  1229 0.8 Creatinine  [RR]  1424 Discussed with CT tech to prioritize scan [RR]  1529 Spoke with orthopedics, Dr. Barton Gaba.  He is going to consult other physicians to see if they can take her onto their service or with trauma surgery or possibly back to [place], however patient would like to remain in [place]. He is aware of the diminished pulse and that we are getting a CTA of the leg. He reports he will call back in an hour with a plan. He is unsure if the patient will need to remain at [facility] or [facility] or transfer elsewhere. [RR]  1536 Periprosthetic knee fracture after slipping today. Dr. Barton said that he will consult with trauma surgery, believing that this patient may need to go  and see her surgeons in [place].  Need to have ortho come and evaluate patient in person and write note.   Diminished pulse to leg.  CTA pending  Waiting on call back from [place].  [CB]    Clinical Course User Index [CB] [name], PA-C [RR] [name], PA-C   Medical Decision Making Amount and/or Complexity of Data Reviewed Labs: ordered. Radiology: ordered.  Risk Prescription drug management.   68 y.o. female presents to the ER for evaluation of left leg pain status post fall. Differential diagnosis includes but is not limited to sprain, strain, fracture, dislocation. Vital signs temperature 97.3 otherwise unremarkable. Physical exam as noted above.   On previous right evaluation, she had a left hip repair in 2023 by Dr. Fidel with [facility].  She had a right knee repair in February 2023 and a left knee repair in November 2022 with Dr.Howe with [facility] in [place].  X-rays were ordered in triage.  On physical examination, patient's pulse is slightly diminished on the left comparison to the right.  She still able to wiggle her toes and still has equal sensation bilaterally.  Compartments are soft.  Concerning given x-ray showing severely  displaced periprosthetic fracture.  I have ordered a CTA.  My attending assessed at bedside and agrees with plan.  I independently reviewed and interpreted the patient's labs.  CBC without leukocytosis or anemia.  BMP shows a bicarb of 21, Leukos at 119 otherwise no other electrolyte abnormality.  XR left hip No acute abnormality seen. Per radiologist's interpretation.    XR left femur/knee Severely displaced and possibly comminuted periprosthetic fracture involving distal left femur. Per radiologist's interpretation.    CTA pending.   1500 - I have assessed the patient at bedside. Pulse marked with X.  Pulses still palpable however is diminished in comparison.  Sensation fully still intact.  Still wiggling toes.  Compartments are still soft.  3:45 PM Care of Willma Obando Salinas-Johnson transferred to [employer] at the end of my shift as the patient will require reassessment once labs/imaging have resulted. Patient presentation, ED course, and plan of care discussed with review of all pertinent labs and imaging. Please see his/her note for further details regarding further ED course and disposition. Plan at time of handoff is follow up with orthopedic physician and CT scan, will need admission regardless. This may be altered or completely changed at the discretion of the oncoming team pending results of further workup.  I discussed this case with my attending physician who cosigned this note including patient's presenting symptoms, physical exam, and planned diagnostics and interventions. Attending physician stated agreement with plan or made changes to plan which were implemented.   Attending physician assessed patient at bedside.  Portions of this report may have been transcribed using voice recognition software. Every effort was made to ensure accuracy; however, inadvertent computerized transcription errors may be present.   Final diagnoses:  None    ED Discharge Orders     None           [name], [place] 04/12/24 1553    Freddi Hamilton, MD 04/13/24 [phone]

## 2024-04-12 NOTE — H&P (Signed)
 Kara Acevedo FMW:[id number] DOB: [birth year] DOA: 04/12/2024     PCP: Renato Dorothey HERO, NP   Outpatient Specialists:  orthopedics Dr. Fidel Patient arrived to ER on 04/12/24 at 1049 Referred by Attending Neysa Caron PARAS, DO   Patient coming from:    home Lives  With family    Chief Complaint:   Chief Complaint  Patient presents with   Fall    HPI: Kara Acevedo is a 68 y.o. female with medical history significant of   arthritis HTN, ADD neuropathy obesity bariatric surgery, left hip fracture 2023 by Dr. Fidel    Presented with   fall left leg pain Had a mechanical fall at home  Reports left knee hip pain She is slipped on the floor Landed in a split She have had recent knee surgeries and recent left femur fracture and repair No history of head injury no LOC no neck pain no back pain no chest pain Tolerated prior procedures well Not on blood thinners Patient has history of hypertension for which she takes Norvasc      Denies significant ETOH intake   Does not smoke      Regarding pertinent Chronic problems:       HTN on Norvasc     obesity-   BMI Readings from Last 1 Encounters:  04/12/24 35.78 kg/m     While in ER: Clinical Course as of 04/12/24 1908  Tue Apr 12, 2024  1229 0.8 Creatinine  [RR]  1424 Discussed with CT tech to prioritize scan [RR]  1529 Spoke with orthopedics, Dr. Barton Gaba.  He is going to consult other physicians to see if they can take her onto their service or with trauma surgery or possibly back to [place], however patient would like to remain in [place]. He is aware of the diminished pulse and that we are getting a CTA of the leg. He reports he will call back in an hour with a plan. He is unsure if the patient will need to remain at [facility] or [facility] or transfer elsewhere. [RR]  1536 Periprosthetic knee fracture after slipping today. Dr. Barton said that he will consult with trauma surgery,  believing that this patient may need to go and see her surgeons in [place].  Need to have ortho come and evaluate patient in person and write note.   Diminished pulse to leg.  CTA pending  Waiting on call back from [facility].  [CB]  1824 Spoke with Dr. Barton who said he spoke with Dr. kevin haddix with the trauma team and that she was to be admitted to hospitalist, need to be over at [facility] and to be made n.p.o. at midnight with surgery scheduled at 4 AM. [CB]    Clinical Course User Index [CB] [name], PA-C [RR] [name], PA-C     Lab Orders         Basic metabolic panel         CBC         I-stat chem 8, ED     Left femur - Severely displaced and possibly comminuted distal left femoral periprosthetic fracture.  Pelvis - No acute abnormality seen.  Left knee -  Severely displaced and possibly comminuted periprosthetic fracture involving distal left femur.    CTA  1. No evidence for arterial injury. Note is made that there is limited evaluation of the level of the popliteal artery secondary to streak artifact from knee arthroplasty. 2. Acute comminuted fracture of  the distal left femur at the level of the knee prosthesis. 3.  No acute localizing process in the abdomen or pelvis. 4. Cholelithiasis. 5. Sigmoid colon diverticulosis.  CXR - ordered    Following Medications were ordered in ER: Medications  HYDROmorphone  (DILAUDID ) injection 1 mg (1 mg Intravenous Given 04/12/24 1207)  ondansetron  (ZOFRAN -ODT) disintegrating tablet 4 mg (4 mg Oral Given 04/12/24 1228)  iohexol  (OMNIPAQUE ) 350 MG/ML injection 100 mL (100 mLs Intravenous Contrast Given 04/12/24 1530)  HYDROmorphone  (DILAUDID ) injection 1 mg (1 mg Intravenous Given 04/12/24 1516)  HYDROmorphone  (DILAUDID ) injection 1 mg (1 mg Intravenous Given 04/12/24 1632)  lactated ringers  bolus 500 mL (500 mLs Intravenous Bolus 04/12/24 1813)    _______________________________________________________ ER  Provider Called:    orthopedics, Dr. Barton  They Recommend admit to medicine  to [facility]  to be made n.p.o. at midnight with surgery scheduled at 4 AM.      ED Triage Vitals  Encounter Vitals Group     BP 04/12/24 1101 (!) 143/94     Girls Systolic BP Percentile --      Girls Diastolic BP Percentile --      Boys Systolic BP Percentile --      Boys Diastolic BP Percentile --      Pulse Rate 04/12/24 1101 72     Resp 04/12/24 1101 20     Temp 04/12/24 1101 (!) 97.5 F (36.4 C)     Temp Source 04/12/24 1101 Oral     SpO2 04/12/24 1101 99 %     Weight 04/12/24 1100 215 lb (97.5 kg)     Height 04/12/24 1059 5' 5 (1.651 m)     Head Circumference --      Peak Flow --      Pain Score 04/12/24 1058 10     Pain Loc --      Pain Education --      Exclude from Growth Chart --   UFJK(75)@     _________________________________________ Significant initial  Findings: Abnormal Labs Reviewed  BASIC METABOLIC PANEL WITH GFR - Abnormal; Notable for the following components:      Result Value   CO2 21 (*)    Glucose, Bld 119 (*)    All other components within normal limits  I-STAT CHEM 8, ED - Abnormal; Notable for the following components:   Glucose, Bld 109 (*)    Calcium, Ion 1.10 (*)    All other components within normal limits     ECG: Ordered    The recent clinical data is shown below. Vitals:   04/12/24 1300 04/12/24 1500 04/12/24 1515 04/12/24 1729  BP:  139/77  131/73  Pulse: (!) 57 71 66 78  Resp:  (!) 21  18  Temp:  (!) 97.3 F (36.3 C)  (!) 97.5 F (36.4 C)  TempSrc:  Oral  Oral  SpO2: 100% 100% 100% 97%  Weight:      Height:        WBC     Component Value Date/Time   WBC 5.8 04/12/2024 1157   LYMPHSABS 2.2 02/26/2024 1725   MONOABS 0.5 02/26/2024 1725   EOSABS 0.1 02/26/2024 1725   BASOSABS 0.0 02/26/2024 1725      __________________________________________________________ Recent Labs  Lab 04/12/24 1157 04/12/24 1217  NA 140 140  K 3.7 4.4  CO2 21*   --   GLUCOSE 119* 109*  BUN 11 14  CREATININE 0.80 0.80  CALCIUM 9.1  --  Cr   stable,   Lab Results  Component Value Date   CREATININE 0.80 04/12/2024   CREATININE 0.80 04/12/2024   CREATININE 1.00 02/26/2024    Plt: Lab Results  Component Value Date   PLT 239 04/12/2024    Recent Labs  Lab 04/12/24 1157 04/12/24 1217  WBC 5.8  --   HGB 13.1 12.6  HCT 39.0 37.0  MCV 93.3  --   PLT 239  --     HG/HCT   stable,      Component Value Date/Time   HGB 12.6 04/12/2024 1217   HCT 37.0 04/12/2024 1217   MCV 93.3 04/12/2024 1157    _______________________________________________ Hospitalist was called for admission for   Peri-prosthetic femoral shaft fracture     The following Work up has been ordered so far:  Orders Placed This Encounter  Procedures   DG Femur 1 View Left   DG Hip Unilat W or Wo Pelvis 2-3 Views Left   DG Knee 1-2 Views Left   CT Angio Aortobifemoral W and/or Wo Contrast   Basic metabolic panel   CBC   Monitor O2 SATs   Consult to orthopedic surgery   Consult to orthopedic surgery  Please consult Dr. Barton with [facility]   Consult to hospitalist   I-stat chem 8, ED     Cultures:    Component Value Date/Time   SDES URINE, CLEAN CATCH 03/21/2016 2008   SPECREQUEST NONE 03/21/2016 2008   CULT MULTIPLE SPECIES PRESENT, SUGGEST RECOLLECTION (A) 03/21/2016 2008   REPTSTATUS 03/23/2016 FINAL 03/21/2016 2008     Radiological Exams on Admission: CT Angio Aortobifemoral W and/or Wo Contrast Result Date: 04/12/2024 CLINICAL DATA:  Leg trauma, knee fracture.  Diminished pulses. EXAM: CT ANGIOGRAPHY OF ABDOMINAL AORTA WITH ILIOFEMORAL RUNOFF TECHNIQUE: Multidetector CT imaging of the abdomen, pelvis and lower extremities was performed using the standard protocol during bolus administration of intravenous contrast. Multiplanar CT image reconstructions and MIPs were obtained to evaluate the vascular anatomy. RADIATION DOSE REDUCTION: This exam  was performed according to the departmental dose-optimization program which includes automated exposure control, adjustment of the mA and/or kV according to patient size and/or use of iterative reconstruction technique. CONTRAST:  OMNIPAQUE  IOHEXOL  350 MG/ML SOLN COMPARISON:  Left knee x-ray same day. FINDINGS: VASCULAR Aorta: Normal caliber aorta without aneurysm, dissection, vasculitis or significant stenosis. There is calcified atherosclerotic disease throughout the aorta. Celiac: Patent without evidence of aneurysm, dissection, vasculitis or significant stenosis. SMA: Patent without evidence of aneurysm, dissection, vasculitis or significant stenosis. Renals: Both renal arteries are patent without evidence of aneurysm, dissection, vasculitis, fibromuscular dysplasia or significant stenosis. IMA: Patent without evidence of aneurysm, dissection, vasculitis or significant stenosis. RIGHT Lower Extremity Inflow: Common, internal and external iliac arteries are patent without evidence of aneurysm, dissection, vasculitis or significant stenosis. Outflow: Common, superficial and profunda femoral arteries and the popliteal artery are patent without evidence of aneurysm, dissection, vasculitis or significant stenosis. Limited evaluation of popliteal artery secondary to streak artifact from the knee. Runoff: Patent three vessel runoff to the ankle. LEFT Lower Extremity Inflow: Common, internal and external iliac arteries are patent without evidence of aneurysm, dissection, vasculitis or significant stenosis. Outflow: Common, superficial and profunda femoral arteries are patent without evidence of aneurysm, dissection, vasculitis or significant stenosis. There is limited evaluation of a portion of the popliteal artery secondary to streak artifact from the knee. Runoff: Patent three vessel runoff to the ankle. Veins: No obvious venous abnormality within the limitations of  this arterial phase study. Review of the MIP  images confirms the above findings. NON-VASCULAR Lower chest: No acute abnormality. Hepatobiliary: Gallstones are likely present. No biliary ductal dilatation. Liver is within normal limits. Pancreas: Unremarkable. No pancreatic ductal dilatation or surrounding inflammatory changes. Spleen: Normal in size without focal abnormality. Adrenals/Urinary Tract: Adrenal glands are unremarkable. Kidneys are normal, without renal calculi, focal lesion, or hydronephrosis. Bladder is unremarkable. Stomach/Bowel: There surgical changes of the proximal stomach. Small hiatal hernia is present. Appendix appears normal. No evidence of bowel wall thickening, distention, or inflammatory changes. There is sigmoid colon diverticulosis. Lymphatic: No enlarged lymph nodes are seen. Reproductive: Uterus and bilateral adnexa are unremarkable. Other: No abdominal wall hernia or abnormality. No abdominopelvic ascites. Musculoskeletal: Degenerative changes affect the left hip and spine. Left-sided hip screw and intramedullary nail are present. Bilateral knee arthroplasties are present. There is an oblique acute comminuted fracture of the distal femur at the level of the prosthesis. There is 1 cm of posterolateral displacement of the distal fracture fragment. IMPRESSION: 1. No evidence for arterial injury. Note is made that there is limited evaluation of the level of the popliteal artery secondary to streak artifact from knee arthroplasty. 2. Acute comminuted fracture of the distal left femur at the level of the knee prosthesis. 3.  No acute localizing process in the abdomen or pelvis. 4. Cholelithiasis. 5. Sigmoid colon diverticulosis. Aortic Atherosclerosis (ICD10-I70.0). Electronically Signed   By: Greig Pique M.D.   On: 04/12/2024 17:48   DG Hip Unilat W or Wo Pelvis 2-3 Views Left Result Date: 04/12/2024 CLINICAL DATA:  Left knee pain after fall. EXAM: DG HIP (WITH OR WITHOUT PELVIS) 2-3V LEFT COMPARISON:  February 24, 2022. FINDINGS:  Status post surgical internal fixation proximal left femoral intrauterine fracture. No acute fracture or dislocation is noted. IMPRESSION: No acute abnormality seen. Electronically Signed   By: Lynwood Landy Raddle M.D.   On: 04/12/2024 12:10   DG Femur 1 View Left Result Date: 04/12/2024 CLINICAL DATA:  Left knee pain after fall. EXAM: LEFT FEMUR 1 VIEW COMPARISON:  February 24, 2022. FINDINGS: Status post surgical internal fixation of proximal left femoral fracture. Status post left total knee arthroplasty. Severely displaced and possibly comminuted periprosthetic fracture of distal left femur is noted. IMPRESSION: Severely displaced and possibly comminuted distal left femoral periprosthetic fracture. Electronically Signed   By: Lynwood Landy Raddle M.D.   On: 04/12/2024 12:08   DG Knee 1-2 Views Left Result Date: 04/12/2024 CLINICAL DATA:  Left knee pain after fall. EXAM: LEFT KNEE - 1-2 VIEW COMPARISON:  February 23, 2022. FINDINGS: Status post left total knee arthroplasty. Severely displaced and possibly comminuted periprosthetic fracture is seen involving the distal left femur. IMPRESSION: Severely displaced and possibly comminuted periprosthetic fracture involving distal left femur. Electronically Signed   By: Lynwood Landy Raddle M.D.   On: 04/12/2024 12:05   _______________________________________________________________________________________________________ Latest  Blood pressure 131/73, pulse 78, temperature (!) 97.5 F (36.4 C), temperature source Oral, resp. rate 18, height 5' 5 (1.651 m), weight 97.5 kg, SpO2 97%.   Vitals  labs and radiology finding personally reviewed  Review of Systems:    Pertinent positives include:  left leg pain   Constitutional:  No weight loss, night sweats, Fevers, chills, fatigue, weight loss  HEENT:  No headaches, Difficulty swallowing,Tooth/dental problems,Sore throat,  No sneezing, itching, ear ache, nasal congestion, post nasal drip,  Cardio-vascular:  No chest pain,  Orthopnea, PND, anasarca, dizziness, palpitations.no Bilateral lower extremity swelling  GI:  No heartburn, indigestion, abdominal pain, nausea, vomiting, diarrhea, change in bowel habits, loss of appetite, melena, blood in stool, hematemesis Resp:  no shortness of breath at rest. No dyspnea on exertion, No excess mucus, no productive cough, No non-productive cough, No coughing up of blood.No change in color of mucus.No wheezing. Skin:  no rash or lesions. No jaundice GU:  no dysuria, change in color of urine, no urgency or frequency. No straining to urinate.  No flank pain.  Musculoskeletal:  No joint pain or no joint swelling. No decreased range of motion. No back pain.  Psych:  No change in mood or affect. No depression or anxiety. No memory loss.  Neuro: no localizing neurological complaints, no tingling, no weakness, no double vision, no gait abnormality, no slurred speech, no confusion  All systems reviewed and apart from HOPI all are negative _______________________________________________________________________________________________ Past Medical History:   Past Medical History:  Diagnosis Date   Arthritis    Hypertension       Past Surgical History:  Procedure Laterality Date   INTRAMEDULLARY (IM) NAIL INTERTROCHANTERIC Left 02/24/2022   Procedure: INTRAMEDULLARY (IM) NAIL INTERTROCHANTRIC;  Surgeon: Fidel Rogue, MD;  Location: [facility];  Service: Orthopedics;  Laterality: Left;    Social History:  Ambulatory   independently      reports that she has never smoked. She has never used smokeless tobacco. She reports that she does not drink alcohol  and does not use drugs.     Family History:   Family History  Family history unknown: Yes   ______________________________________________________________________________________________ Allergies: Allergies  Allergen Reactions   Doxycycline Nausea And Vomiting   Latex Other (See Comments)    Blisters    Sulfa  Antibiotics Rash   Fluconazole Other (See Comments)    Feels like a burning sensation from inside out   Ace Inhibitors Cough     Prior to Admission medications   Medication Sig Start Date End Date Taking? Authorizing Provider  amLODipine  (NORVASC ) 5 MG tablet Take 1 tablet (5 mg total) by mouth daily. Please hold this medication if your systolic blood pressure ( top number) is less than 100. 02/28/22   Jerri Keys, MD  amphetamine-dextroamphetamine (ADDERALL) 15 MG tablet Take 15 mg by mouth daily.     [provider]  ibuprofen (ADVIL,MOTRIN) 800 MG tablet Take 800 mg by mouth 3 (three) times daily as needed for moderate pain.    [provider]  indapamide  (LOZOL ) 2.5 MG tablet Take 1 tablet (2.5 mg total) by mouth daily as needed. edema 02/28/22   Jerri Keys, MD  morphine  (MSIR) 15 MG tablet Take 0.5 tablets (7.5 mg total) by mouth every 4 (four) hours as needed. 02/26/24   Emil Share, DO  Semaglutide-Weight Management (WEGOVY) 2.4 MG/0.75ML SOAJ Inject 2.4 mg into the skin once a week.    [provider]    ___________________________________________________________________________________________________ Physical Exam:    04/12/2024    5:29 PM 04/12/2024    3:15 PM 04/12/2024    3:00 PM  Vitals with BMI  Systolic 131  139  Diastolic 73  77  Pulse 78 66 71     1. General:  in No  Acute distress    Chronically ill   -appearing 2. Psychological: Alert and   Oriented 3. Head/ENT:   Moist  Dry Mucous Membranes                          Head Non traumatic,  neck supple                           Poor Dentition 4. SKIN:  decreased Skin turgor,  Skin clean Dry and intact no rash    5. Heart: Regular rate and rhythm no  Murmur, no Rub or gallop 6. Lungs:   no wheezes or crackles   7. Abdomen: Soft,  non-tender, Non distended   obese  bowel sounds present 8. Lower extremities: no clubbing, cyanosis, no  edema 9. Neurologically Grossly intact, moving all 4 extremities  equally  10. MSK: Normal range of motion except in left leg    Chart has been reviewed  ______________________________________________________________________________________________  Assessment/Plan 69 y.o. female with medical history significant of   arthritis HTN, ADD neuropathy obesity bariatric surgery, left hip fracture 2023 by Dr. Fidel   Admitted for   Peri-prosthetic femoral shaft fracture      Present on Admission:  Femur fracture, left (HCC)  Essential hypertension  ADHD  Morbid obesity (HCC)     Femur fracture, left (HCC)  - management as per orthopedics,  plan to operate   in  a.m.    Keep nothing by mouth post midnight. Patient not on anticoagulation or antiplatelet agents    Ordered type and screen  order a vitamin D  level  Patient at baseline  able to walk a flight of stairs or 100 feet ([place] 4 miles a day)   Patient denies any chest pain or shortness of breath currently and/or with exertion,  ECG ordered  no known history of coronary artery disease,  COPD   Liver failure  CKD  Given advanced age patient is at least moderate  risk  which has been discussed with family but at this point no furthther cardiac workup is indicated.     Essential hypertension Restart Norvasc  when able tolerate  ADHD Hold meds for tonight  Morbid obesity (HCC) Contributing to comorbidity and complicating medical management  Body mass index is 35.78 kg/m.  Nutritional follow up as an out pt would be recommended   Other plan as per orders.  DVT prophylaxis:  SCD     Code Status:    Code Status: Prior FULL CODE  as per patient   I had personally discussed CODE STATUS with patient and family   ACP   none   Family Communication:   Family  at  Bedside  plan of care was discussed   with  Daughter,    Diet  heart healthy and npo after midnight    Disposition Plan:      To home once workup is complete and patient is stable   Following barriers for discharge:                                                            Pain controlled with PO medications                                                          Will need consultants to evaluate patient prior to discharge  Fracture repaired    Consult Orders  (From admission, onward)           Start     Ordered   04/12/24 1832  Consult to hospitalist  Once       Provider:  (Not yet assigned)  Question Answer Comment  Place call to: [facility]   Reason for Consult Admit      04/12/24 1832             Consults called: orthopedics   Admission status:  ED Disposition     ED Disposition  Admit   Condition  --   Comment  Hospital Area: [facility] [[id number]]  Level of Care: Med-Surg [16]  May admit patient to [facility] or [facility] if equivalent level of care is available:: No  Covid Evaluation: Asymptomatic - no recent exposure (last 10 days) testing not required  Diagnosis: Femur fracture, left [facility]) [[id number]]  Admitting Physician: Yoshika Vensel [[id number]]  Attending Physician: Zamiyah Resendes [[id number]]  Certification:: I certify this patient will need inpatient services for at least 2 midnights  Expected Medical Readiness: 04/15/2024             inpatient     I Expect 2 midnight stay secondary to severity of patient's current illness need for inpatient interventions justified by the following:     Severe lab/radiological/exam abnormalities including:    Peri-prosthetic femoral shaft fracture    That are currently affecting medical management.   I expect  patient to be hospitalized for 2 midnights requiring inpatient medical care.  Patient is at high risk for adverse outcome (such as loss of life or disability) if not treated.  Indication for inpatient stay as follows:    Need for operative/procedural  intervention    Need for , IV fluids,, IV pain medications,    Level of care      medical floor       Blease Quiver 04/12/2024, 7:48 PM    [facility]     after 2 AM please page floor coverage   If 7AM-7PM, please contact the day team taking care of the patient using [email or web address]

## 2024-04-12 NOTE — Assessment & Plan Note (Signed)
 Hold meds for tonight

## 2024-04-12 NOTE — Assessment & Plan Note (Signed)
-   management as per orthopedics,  plan to operate   in  a.m.    Keep nothing by mouth post midnight. Patient not on anticoagulation or antiplatelet agents    Ordered type and screen  order a vitamin D  level  Patient at baseline  able to walk a flight of stairs or 100 feet ([place] 4 miles a day)   Patient denies any chest pain or shortness of breath currently and/or with exertion,  ECG ordered  no known history of coronary artery disease,  COPD   Liver failure  CKD  Given advanced age patient is at least moderate  risk  which has been discussed with family but at this point no furthther cardiac workup is indicated.

## 2024-04-12 NOTE — Assessment & Plan Note (Signed)
 Restart Norvasc  when able tolerate

## 2024-04-12 NOTE — ED Triage Notes (Addendum)
 Pt BIB EMS from home with reports of a fall. Pt complains of left knee, hip, and left femur pain. Pt fell at home, slipping from something that was on the floor. Pt states that one leg went one way from another. Pt reports not being able to stretch out her leg. Pt reports recent knee surgeries and a recent left femur fx.

## 2024-04-12 NOTE — ED Notes (Signed)
 Call to [facility] for transport to [facility]

## 2024-04-12 NOTE — ED Notes (Signed)
 Emptied cannister; 800 ml of urine.

## 2024-04-12 NOTE — ED Provider Notes (Addendum)
  Physical Exam  BP 131/73 (BP Location: Left Arm)   Pulse 78   Temp (!) 97.5 F (36.4 C) (Oral)   Resp 18   Ht 5' 5 (1.651 m)   Wt 97.5 kg   SpO2 97%   BMI 35.78 kg/m   Physical Exam Vitals and nursing note reviewed.  Constitutional:      General: She is not in acute distress.    Appearance: Normal appearance. She is not ill-appearing.  Eyes:     Extraocular Movements: Extraocular movements intact.     Conjunctiva/sclera: Conjunctivae normal.  Cardiovascular:     Rate and Rhythm: Normal rate and regular rhythm.  Pulmonary:     Effort: Pulmonary effort is normal. No respiratory distress.  Skin:    General: Skin is warm and dry.  Neurological:     Mental Status: She is alert and oriented to person, place, and time.     Procedures  Procedures  ED Course / MDM   Clinical Course as of 04/12/24 1826  Tue Apr 12, 2024  1229 0.8 Creatinine  [RR]  1424 Discussed with CT tech to prioritize scan [RR]  1529 Spoke with orthopedics, Dr. Barton Gaba.  He is going to consult other physicians to see if they can take her onto their service or with trauma surgery or possibly back to [place], however patient would like to remain in [place]. He is aware of the diminished pulse and that we are getting a CTA of the leg. He reports he will call back in an hour with a plan. He is unsure if the patient will need to remain at [facility] or [facility] or transfer elsewhere. [RR]  1536 Periprosthetic knee fracture after slipping today. Dr. Barton said that he will consult with trauma surgery, believing that this patient may need to go and see her surgeons in [place].  Need to have ortho come and evaluate patient in person and write note.   Diminished pulse to leg.  CTA pending  Waiting on call back from [facility].  [CB]  1824 Spoke with Dr. Barton who said he spoke with Dr. kevin haddix with the trauma team and that she was to be admitted to hospitalist, need to be over at [facility]  and to be made n.p.o. at midnight with surgery scheduled at 4 AM. [CB]    Clinical Course User Index [CB] [name], PA-C [RR] [name], PA-C   Medical Decision Making Amount and/or Complexity of Data Reviewed Labs: ordered. Radiology: ordered.  Risk Prescription drug management. Decision regarding hospitalization.   Patient care transferred over from [facility], PA-C.  At time of handoff, awaiting call from Dr. Barton and who is consulting with trauma surgery for the Left-sided severely displaced and possibly comminuted distal left femoral periprosthetic fracture noted to have happened after she slipped in the kitchen.  Had previous surgery on 02/24/2022, done by Dr. Fidel.  Spoke with Dr. Barton who came and saw the patient, who reported that he spoke with Dr. Franky Haddix with the trauma team, wishing for her to be admitted to hospitalist at [facility] and to be made n.p.o. at midnight with surgery scheduled at 4 AM.  Spoke to hospitalist, Dr. Rolan Quiver, patient care transferred over to her at this time.       [name], PA-C 04/12/24 1858    [name], PA-C 04/12/24 1859    Neysa Caron PARAS, DO 04/13/24 CLAUDE[place]

## 2024-04-12 NOTE — Discharge Instructions (Addendum)
 Orthopaedic Trauma Service Discharge Instructions   General Discharge Instructions  WEIGHT BEARING STATUS:Weightbearing as tolerated  RANGE OF MOTION/ACTIVITY: ok for knee and ankle motion as tolerated  Wound Care: You may remove your surgical dressing on post op day #2 (Friday 04/15/24). Incisions can be left open to air if there is no drainage. Once the incision is completely dry and without drainage, it may be left open to air out.  Showering may begin post op day #3 (Saturday 04/16/24).  Clean incision gently with soap and water .  DVT/PE prophylaxis: Eliquis  2.5 mg twice daily x 30 days  Diet: as you were eating previously.  Can use over the counter stool softeners and bowel preparations, such as Miralax , to help with bowel movements.  Narcotics can be constipating.  Be sure to drink plenty of fluids  PAIN MEDICATION USE AND EXPECTATIONS  You have likely been given narcotic medications to help control your pain.  After a traumatic event that results in an fracture (broken bone) with or without surgery, it is ok to use narcotic pain medications to help control one's pain.  We understand that everyone responds to pain differently and each individual patient will be evaluated on a regular basis for the continued need for narcotic medications. Ideally, narcotic medication use should last no more than 6-8 weeks (coinciding with fracture healing).   As a patient it is your responsibility as well to monitor narcotic medication use and report the amount and frequency you use these medications when you come to your office visit.   We would also advise that if you are using narcotic medications, you should take a dose prior to therapy to maximize you participation.  IF YOU ARE ON NARCOTIC MEDICATIONS IT IS NOT PERMISSIBLE TO OPERATE A MOTOR VEHICLE (MOTORCYCLE/CAR/TRUCK/MOPED) OR HEAVY MACHINERY DO NOT MIX NARCOTICS WITH OTHER CNS (CENTRAL NERVOUS SYSTEM) DEPRESSANTS SUCH AS ALCOHOL   POST-OPERATIVE  OPIOID TAPER INSTRUCTIONS: It is important to wean off of your opioid medication as soon as possible. If you do not need pain medication after your surgery it is ok to stop day one. Opioids include: Codeine, Hydrocodone (Norco, Vicodin), Oxycodone (Percocet, oxycontin ) and hydromorphone  amongst others.  Long term and even short term use of opiods can cause: Increased pain response Dependence Constipation Depression Respiratory depression And more.  Withdrawal symptoms can include Flu like symptoms Nausea, vomiting And more Techniques to manage these symptoms Hydrate well Eat regular healthy meals Stay active Use relaxation techniques(deep breathing, meditating, yoga) Do Not substitute Alcohol  to help with tapering If you have been on opioids for less than two weeks and do not have pain than it is ok to stop all together.  Plan to wean off of opioids This plan should start within one week post op of your fracture surgery  Maintain the same interval or time between taking each dose and first decrease the dose.  Cut the total daily intake of opioids by one tablet each day Next start to increase the time between doses. The last dose that should be eliminated is the evening dose.    STOP SMOKING OR USING NICOTINE PRODUCTS!!!!  As discussed nicotine severely impairs your body's ability to heal surgical and traumatic wounds but also impairs bone healing.  Wounds and bone heal by forming microscopic blood vessels (angiogenesis) and nicotine is a vasoconstrictor (essentially, shrinks blood vessels).  Therefore, if vasoconstriction occurs to these microscopic blood vessels they essentially disappear and are unable to deliver necessary nutrients to the healing tissue.  This  is one modifiable factor that you can do to dramatically increase your chances of healing your injury.  (This means no smoking, no nicotine gum, patches, etc)  DO NOT USE NONSTEROIDAL ANTI-INFLAMMATORY DRUGS (NSAID'S)  Using  products such as Advil (ibuprofen), Aleve (naproxen), Motrin (ibuprofen) for additional pain control during fracture healing can delay and/or prevent the healing response.  If you would like to take over the counter (OTC) medication, Tylenol  (acetaminophen ) is ok.  However, some narcotic medications that are given for pain control contain acetaminophen  as well. Therefore, you should not exceed more than 4000 mg of tylenol  in a day if you do not have liver disease.  Also note that there are may OTC medicines, such as cold medicines and allergy medicines that my contain tylenol  as well.  If you have any questions about medications and/or interactions please ask your doctor/PA or your pharmacist.      ICE AND ELEVATE INJURED/OPERATIVE EXTREMITY  Using ice and elevating the injured extremity above your heart can help with swelling and pain control.  Icing in a pulsatile fashion, such as 20 minutes on and 20 minutes off, can be followed.    Do not place ice directly on skin. Make sure there is a barrier between to skin and the ice pack.    Using frozen items such as frozen peas works well as the conform nicely to the are that needs to be iced.  USE AN ACE WRAP OR TED HOSE FOR SWELLING CONTROL  In addition to icing and elevation, Ace wraps or TED hose are used to help limit and resolve swelling.  It is recommended to use Ace wraps or TED hose until you are informed to stop.    When using Ace Wraps start the wrapping distally (farthest away from the body) and wrap proximally (closer to the body)   Example: If you had surgery on your leg or thing and you do not have a splint on, start the ace wrap at the toes and work your way up to the thigh        If you had surgery on your upper extremity and do not have a splint on, start the ace wrap at your fingers and work your way up to the upper arm   CALL THE OFFICE FOR MEDICATION REFILLS OR WITH ANY QUESTIONS/CONCERNS: [phone]   VISIT OUR WEBSITE FOR  ADDITIONAL INFORMATION: [email or web address]  Discharge Wound Care Instructions  Do NOT apply any ointments, solutions or lotions to pin sites or surgical wounds.  These prevent needed drainage and even though solutions like hydrogen peroxide kill bacteria, they also damage cells lining the pin sites that help fight infection.  Applying lotions or ointments can keep the wounds moist and can cause them to breakdown and open up as well. This can increase the risk for infection. When in doubt call the office.  Surgical incisions should be dressed daily.  If any drainage is noted, use one layer of adaptic or Mepitel, then gauze, Kerlix, and an ace wrap. - These dressing supplies should be available at local medical supply stores ([facility] [employer], [facility], etc) as well as Insurance claims handler ([employer], [facility], [place], etc)  Once the incision is completely dry and without drainage, it may be left open to air out.  Showering may begin 36-48 hours later.  Cleaning gently with soap and water .  Traumatic wounds should be dressed daily as well.    One layer of adaptic, gauze, Kerlix, then ace wrap.  The  adaptic can be discontinued once the draining has ceased    If you have a wet to dry dressing: wet the gauze with saline the squeeze as much saline out so the gauze is moist (not soaking wet), place moistened gauze over wound, then place a dry gauze over the moist one, followed by Kerlix wrap, then ace wrap.    Call office for the following: Temperature greater than 101F Persistent nausea and vomiting Severe uncontrolled pain Redness, tenderness, or signs of infection (pain, swelling, redness, odor or green/yellow discharge around the site) Difficulty breathing, headache or visual disturbances Hives Persistent dizziness or light-headedness Extreme fatigue Any other questions or concerns you may have after discharge  In an emergency, call 911 or go to an Emergency Department at a nearby  hospital  OTHER HELPFUL INFORMATION  If you had a block, it will wear off between 8-24 hrs postop typically.  This is period when your pain may go from nearly zero to the pain you would have had postop without the block.  This is an abrupt transition but nothing dangerous is happening.  You may take an extra dose of narcotic when this happens.  You should wean off your narcotic medicines as soon as you are able.  Most patients will be off or using minimal narcotics before their first postop appointment.   We suggest you use the pain medication the first night prior to going to bed, in order to ease any pain when the anesthesia wears off. You should avoid taking pain medications on an empty stomach as it will make you nauseous.  Do not drink alcoholic beverages or take illicit drugs when taking pain medications.  In most states it is against the law to drive while you are in a splint or sling.  And certainly against the law to drive while taking narcotics.  You may return to work/school in the next couple of days when you feel up to it.   Pain medication may make you constipated.  Below are a few solutions to try in this order: Decrease the amount of pain medication if you aren't having pain. Drink lots of decaffeinated fluids. Drink prune juice and/or each dried prunes  If the first 3 don't work start with additional solutions Take Colace - an over-the-counter stool softener Take Senokot - an over-the-counter laxative Take Miralax  - a stronger over-the-counter laxative

## 2024-04-12 NOTE — Consult Note (Addendum)
 Patient ID: Kara Acevedo MRN: [id number] DOB/AGE: 02-05-1956 68 y.o.  Admit date: 04/12/2024  Admission Diagnoses:  Principal Problem:   Femur fracture, left (HCC) Active Problems:   ADHD   Essential hypertension   Morbid obesity (HCC)   HPI: Ortho consult for left distal femur comminuted periprosthetic fracture sustained today 04/12/24 after fall from standing in kitchen. Denies pain elsewhere. She had left hip short nail done by Dr. Fidel in 2023. [place] [facility] [place]. Denies antecedent pain in this area.  PMH notable for HTN.  Denies numbness/tingling. Accompanied by daughter at bedside.  Past Medical History: Past Medical History:  Diagnosis Date   Arthritis    Hypertension     Surgical History: Past Surgical History:  Procedure Laterality Date   INTRAMEDULLARY (IM) NAIL INTERTROCHANTERIC Left 02/24/2022   Procedure: INTRAMEDULLARY (IM) NAIL INTERTROCHANTRIC;  Surgeon: Fidel Rogue, MD;  Location: [facility];  Service: Orthopedics;  Laterality: Left;    Family History: Family History  Family history unknown: Yes    Social History: Social History   Socioeconomic History   Marital status: Married    Spouse name: Not on file   Number of children: Not on file   Years of education: Not on file   Highest education level: Not on file  Occupational History   Not on file  Tobacco Use   Smoking status: Never   Smokeless tobacco: Never  Substance and Sexual Activity   Alcohol  use: No   Drug use: No   Sexual activity: Never  Other Topics Concern   Not on file  Social History Narrative   Not on file   Social Drivers of Health   Financial Resource Strain: Low Risk  (09/27/2021)   Received from [facility]   Overall Financial Resource Strain (CARDIA)    Difficulty of Paying Living Expenses: Not hard at all  Food Insecurity: Not on file  Transportation Needs: Not on file  Physical Activity: Not on file   Stress: No Stress Concern Present (09/27/2021)   Received from [facility] - Occupational Stress Questionnaire    Feeling of Stress : Not at all  Social Connections: Unknown (02/14/2022)   [facility]   Social Network    Social Network: Not on file  Intimate Partner Violence: Unknown (02/14/2022)   Received from [facility]   HITS    Physically Hurt: Not on file    Insult or Talk Down To: Not on file    Threaten Physical Harm: Not on file    Scream or Curse: Not on file    Allergies: Doxycycline, Latex, Sulfa antibiotics, Fluconazole, and Ace inhibitors  Medications: I have reviewed the patient's current medications.  Vital Signs: Patient Vitals for the past 24 hrs:  BP Temp Temp src Pulse Resp SpO2 Height Weight  04/12/24 1900 (!) 125/108 -- -- 81 -- 99 % -- --  04/12/24 1729 131/73 (!) 97.5 F (36.4 C) Oral 78 18 97 % -- --  04/12/24 1515 -- -- -- 66 -- 100 % -- --  04/12/24 1500 139/77 (!) 97.3 F (36.3 C) Oral 71 (!) 21 100 % -- --  04/12/24 1300 -- -- -- (!) 57 -- 100 % -- --  04/12/24 1245 -- -- -- (!) 55 -- 100 % -- --  04/12/24 1101 (!) 143/94 (!) 97.5 F (36.4 C) Oral 72 20 99 % -- --  04/12/24 1100 -- -- -- -- -- -- --  97.5 kg  04/12/24 1059 -- -- -- -- -- -- 5' 5 (1.651 m) --    Radiology: CT Angio Aortobifemoral W and/or Wo Contrast Result Date: 04/12/2024 CLINICAL DATA:  Leg trauma, knee fracture.  Diminished pulses. EXAM: CT ANGIOGRAPHY OF ABDOMINAL AORTA WITH ILIOFEMORAL RUNOFF TECHNIQUE: Multidetector CT imaging of the abdomen, pelvis and lower extremities was performed using the standard protocol during bolus administration of intravenous contrast. Multiplanar CT image reconstructions and MIPs were obtained to evaluate the vascular anatomy. RADIATION DOSE REDUCTION: This exam was performed according to the departmental dose-optimization program which includes automated exposure control, adjustment  of the mA and/or kV according to patient size and/or use of iterative reconstruction technique. CONTRAST:  OMNIPAQUE  IOHEXOL  350 MG/ML SOLN COMPARISON:  Left knee x-ray same day. FINDINGS: VASCULAR Aorta: Normal caliber aorta without aneurysm, dissection, vasculitis or significant stenosis. There is calcified atherosclerotic disease throughout the aorta. Celiac: Patent without evidence of aneurysm, dissection, vasculitis or significant stenosis. SMA: Patent without evidence of aneurysm, dissection, vasculitis or significant stenosis. Renals: Both renal arteries are patent without evidence of aneurysm, dissection, vasculitis, fibromuscular dysplasia or significant stenosis. IMA: Patent without evidence of aneurysm, dissection, vasculitis or significant stenosis. RIGHT Lower Extremity Inflow: Common, internal and external iliac arteries are patent without evidence of aneurysm, dissection, vasculitis or significant stenosis. Outflow: Common, superficial and profunda femoral arteries and the popliteal artery are patent without evidence of aneurysm, dissection, vasculitis or significant stenosis. Limited evaluation of popliteal artery secondary to streak artifact from the knee. Runoff: Patent three vessel runoff to the ankle. LEFT Lower Extremity Inflow: Common, internal and external iliac arteries are patent without evidence of aneurysm, dissection, vasculitis or significant stenosis. Outflow: Common, superficial and profunda femoral arteries are patent without evidence of aneurysm, dissection, vasculitis or significant stenosis. There is limited evaluation of a portion of the popliteal artery secondary to streak artifact from the knee. Runoff: Patent three vessel runoff to the ankle. Veins: No obvious venous abnormality within the limitations of this arterial phase study. Review of the MIP images confirms the above findings. NON-VASCULAR Lower chest: No acute abnormality. Hepatobiliary: Gallstones are likely  present. No biliary ductal dilatation. Liver is within normal limits. Pancreas: Unremarkable. No pancreatic ductal dilatation or surrounding inflammatory changes. Spleen: Normal in size without focal abnormality. Adrenals/Urinary Tract: Adrenal glands are unremarkable. Kidneys are normal, without renal calculi, focal lesion, or hydronephrosis. Bladder is unremarkable. Stomach/Bowel: There surgical changes of the proximal stomach. Small hiatal hernia is present. Appendix appears normal. No evidence of bowel wall thickening, distention, or inflammatory changes. There is sigmoid colon diverticulosis. Lymphatic: No enlarged lymph nodes are seen. Reproductive: Uterus and bilateral adnexa are unremarkable. Other: No abdominal wall hernia or abnormality. No abdominopelvic ascites. Musculoskeletal: Degenerative changes affect the left hip and spine. Left-sided hip screw and intramedullary nail are present. Bilateral knee arthroplasties are present. There is an oblique acute comminuted fracture of the distal femur at the level of the prosthesis. There is 1 cm of posterolateral displacement of the distal fracture fragment. IMPRESSION: 1. No evidence for arterial injury. Note is made that there is limited evaluation of the level of the popliteal artery secondary to streak artifact from knee arthroplasty. 2. Acute comminuted fracture of the distal left femur at the level of the knee prosthesis. 3.  No acute localizing process in the abdomen or pelvis. 4. Cholelithiasis. 5. Sigmoid colon diverticulosis. Aortic Atherosclerosis (ICD10-I70.0). Electronically Signed   By: Greig Pique M.D.   On: 04/12/2024 17:48  DG Hip Unilat W or Wo Pelvis 2-3 Views Left Result Date: 04/12/2024 CLINICAL DATA:  Left knee pain after fall. EXAM: DG HIP (WITH OR WITHOUT PELVIS) 2-3V LEFT COMPARISON:  February 24, 2022. FINDINGS: Status post surgical internal fixation proximal left femoral intrauterine fracture. No acute fracture or dislocation is  noted. IMPRESSION: No acute abnormality seen. Electronically Signed   By: Lynwood Landy Raddle M.D.   On: 04/12/2024 12:10   DG Femur 1 View Left Result Date: 04/12/2024 CLINICAL DATA:  Left knee pain after fall. EXAM: LEFT FEMUR 1 VIEW COMPARISON:  February 24, 2022. FINDINGS: Status post surgical internal fixation of proximal left femoral fracture. Status post left total knee arthroplasty. Severely displaced and possibly comminuted periprosthetic fracture of distal left femur is noted. IMPRESSION: Severely displaced and possibly comminuted distal left femoral periprosthetic fracture. Electronically Signed   By: Lynwood Landy Raddle M.D.   On: 04/12/2024 12:08   DG Knee 1-2 Views Left Result Date: 04/12/2024 CLINICAL DATA:  Left knee pain after fall. EXAM: LEFT KNEE - 1-2 VIEW COMPARISON:  February 23, 2022. FINDINGS: Status post left total knee arthroplasty. Severely displaced and possibly comminuted periprosthetic fracture is seen involving the distal left femur. IMPRESSION: Severely displaced and possibly comminuted periprosthetic fracture involving distal left femur. Electronically Signed   By: Lynwood Landy Raddle M.D.   On: 04/12/2024 12:05    Labs: Recent Labs    04/12/24 1157 04/12/24 1217  WBC 5.8  --   RBC 4.18  --   HCT 39.0 37.0  PLT 239  --    Recent Labs    04/12/24 1157 04/12/24 1217  NA 140 140  K 3.7 4.4  CL 107 108  CO2 21*  --   BUN 11 14  CREATININE 0.80 0.80  GLUCOSE 119* 109*  CALCIUM 9.1  --    No results for input(s): LABPT, INR in the last 72 hours.  Review of Systems: ROS as detailed in HPI  Physical Exam: Body mass index is 35.78 kg/m.  Physical Exam   Gen: AAOx3, NAD Comfortable at rest  Left Lower Extremity: Skin intact, well healed prior surgical scars TTP over distal femur ADF/APF/EHL 5/5 SILT throughout DP, PT 2+ to palp CR < 2s   Assessment and Plan: Ortho consult for left distal femur comminuted periprosthetic fracture sustained today  04/12/24  -history, exam and imaging reviewed at length with patient/family at bedside -plan is for left femur ORIF tomorrow by Ortho Trauma colleagues -reviewed case with Dr. Kendal who has kindly agreed to help manage -please admit to hospitalist service and transfer to [facility] -please keep NPO and hold VTE ppx from midnight -PT/OT postop  Lillia Mountain, MD Orthopaedic Surgeon [facility] [phone]  The risks and benefits were presented and reviewed. The risks include but are not limited to hardware failure/irritation, new/persistent/recurrent infection, stiffness, nerve/vessel/tendon injury, nonunion/malunion of any fracture, wound healing issues, allograft usage, development of arthritis, failure of this surgery, possibility of external fixation in certain situations, possibility of delayed definitive surgery, need for further surgery, prolonged wound care including further soft tissue coverage procedures, thromboembolic events, anesthesia/medical complications/events perioperatively and beyond, amputation, death. The patient acknowledged the explanation, agreed to proceed with the plan.

## 2024-04-12 NOTE — Assessment & Plan Note (Signed)
 Contributing to comorbidity and complicating medical management  Body mass index is 35.78 kg/m.  Nutritional follow up as an out pt would be recommended

## 2024-04-13 ENCOUNTER — Other Ambulatory Visit: Payer: Self-pay

## 2024-04-13 ENCOUNTER — Inpatient Hospital Stay (HOSPITAL_COMMUNITY)

## 2024-04-13 ENCOUNTER — Encounter (HOSPITAL_COMMUNITY)
Admission: EM | Disposition: A | Discharge status: Home Health | Payer: Self-pay | Source: Home / Self Care | Attending: Internal Medicine

## 2024-04-13 ENCOUNTER — Encounter (HOSPITAL_COMMUNITY): Payer: Self-pay | Admitting: Internal Medicine

## 2024-04-13 DIAGNOSIS — Z96649 Presence of unspecified artificial hip joint: Secondary | ICD-10-CM | POA: Diagnosis not present

## 2024-04-13 DIAGNOSIS — S72402A Unspecified fracture of lower end of left femur, initial encounter for closed fracture: Secondary | ICD-10-CM

## 2024-04-13 DIAGNOSIS — M978XXA Periprosthetic fracture around other internal prosthetic joint, initial encounter: Secondary | ICD-10-CM | POA: Diagnosis not present

## 2024-04-13 HISTORY — PX: ORIF FEMUR FRACTURE: SHX2119

## 2024-04-13 LAB — CBC
HCT: 36.6 % (ref 36.0–46.0)
Hemoglobin: 12.3 g/dL (ref 12.0–15.0)
MCH: 31.1 pg (ref 26.0–34.0)
MCHC: 33.6 g/dL (ref 30.0–36.0)
MCV: 92.4 fL (ref 80.0–100.0)
Platelets: 221 K/uL (ref 150–400)
RBC: 3.96 MIL/uL (ref 3.87–5.11)
RDW: 13 % (ref 11.5–15.5)
WBC: 6.4 K/uL (ref 4.0–10.5)
nRBC: 0 % (ref 0.0–0.2)

## 2024-04-13 LAB — VITAMIN D 25 HYDROXY (VIT D DEFICIENCY, FRACTURES): Vit D, 25-Hydroxy: 28.98 ng/mL — ABNORMAL LOW (ref 30–100)

## 2024-04-13 LAB — TYPE AND SCREEN
ABO/RH(D): A POS
Antibody Screen: NEGATIVE

## 2024-04-13 LAB — COMPREHENSIVE METABOLIC PANEL WITH GFR
ALT: 14 U/L (ref 0–44)
AST: 18 U/L (ref 15–41)
Albumin: 2.9 g/dL — ABNORMAL LOW (ref 3.5–5.0)
Alkaline Phosphatase: 57 U/L (ref 38–126)
Anion gap: 8 (ref 5–15)
BUN: 6 mg/dL — ABNORMAL LOW (ref 8–23)
CO2: 25 mmol/L (ref 22–32)
Calcium: 8.8 mg/dL — ABNORMAL LOW (ref 8.9–10.3)
Chloride: 105 mmol/L (ref 98–111)
Creatinine, Ser: 0.76 mg/dL (ref 0.44–1.00)
GFR, Estimated: >60 mL/min (ref >60)
Glucose, Bld: 100 mg/dL — ABNORMAL HIGH (ref 70–99)
Potassium: 3.4 mmol/L — ABNORMAL LOW (ref 3.5–5.1)
Sodium: 138 mmol/L (ref 135–145)
Total Bilirubin: 0.8 mg/dL (ref 0.0–1.2)
Total Protein: 5.8 g/dL — ABNORMAL LOW (ref 6.5–8.1)

## 2024-04-13 LAB — ABO/RH: ABO/RH(D): A POS

## 2024-04-13 LAB — HIV ANTIBODY (ROUTINE TESTING W REFLEX): HIV Screen 4th Generation wRfx: NONREACTIVE

## 2024-04-13 SURGERY — OPEN REDUCTION INTERNAL FIXATION (ORIF) DISTAL FEMUR FRACTURE
Anesthesia: General | Site: Leg Upper | Laterality: Left

## 2024-04-13 MED ORDER — ONDANSETRON HCL 4 MG/2ML IJ SOLN
4.0000 mg | Freq: Four times a day (QID) | INTRAMUSCULAR | Status: DC | PRN
  Administered 2024-04-13 – 2024-04-16 (×2): 4 mg via INTRAVENOUS
  Filled 2024-04-13 (×2): qty 2

## 2024-04-13 MED ORDER — PHENYLEPHRINE 80 MCG/ML (10ML) SYRINGE FOR IV PUSH (FOR BLOOD PRESSURE SUPPORT)
PREFILLED_SYRINGE | INTRAVENOUS | Status: AC
  Filled 2024-04-13: qty 20

## 2024-04-13 MED ORDER — OXYCODONE HCL 5 MG PO TABS
2.5000 mg | ORAL_TABLET | ORAL | Status: DC | PRN
  Administered 2024-04-13 – 2024-04-18 (×17): 5 mg via ORAL
  Filled 2024-04-13 (×17): qty 1

## 2024-04-13 MED ORDER — MIDAZOLAM HCL 2 MG/2ML IJ SOLN
INTRAMUSCULAR | Status: AC
  Filled 2024-04-13: qty 2

## 2024-04-13 MED ORDER — JUVEN PO PACK
1.0000 | PACK | Freq: Two times a day (BID) | ORAL | Status: DC
  Administered 2024-04-14 – 2024-04-18 (×10): 1 via ORAL
  Filled 2024-04-13 (×10): qty 1

## 2024-04-13 MED ORDER — POTASSIUM CHLORIDE 10 MEQ/100ML IV SOLN
10.0000 meq | INTRAVENOUS | Status: AC
  Administered 2024-04-13 (×2): 10 meq via INTRAVENOUS
  Filled 2024-04-13 (×3): qty 100

## 2024-04-13 MED ORDER — PROPOFOL 10 MG/ML IV BOLUS
INTRAVENOUS | Status: AC
  Filled 2024-04-13: qty 20

## 2024-04-13 MED ORDER — CHLORHEXIDINE GLUCONATE 0.12 % MT SOLN
OROMUCOSAL | Status: AC
  Administered 2024-04-13: 15 mL via OROMUCOSAL
  Filled 2024-04-13: qty 15

## 2024-04-13 MED ORDER — LIDOCAINE 2% (20 MG/ML) 5 ML SYRINGE
INTRAMUSCULAR | Status: DC | PRN
  Administered 2024-04-13: 100 mg via INTRAVENOUS

## 2024-04-13 MED ORDER — DEXAMETHASONE SODIUM PHOSPHATE 10 MG/ML IJ SOLN
INTRAMUSCULAR | Status: DC | PRN
  Administered 2024-04-13: 10 mg via INTRAVENOUS

## 2024-04-13 MED ORDER — FENTANYL CITRATE (PF) 250 MCG/5ML IJ SOLN
INTRAMUSCULAR | Status: AC
  Filled 2024-04-13: qty 5

## 2024-04-13 MED ORDER — ROCURONIUM BROMIDE 10 MG/ML (PF) SYRINGE
PREFILLED_SYRINGE | INTRAVENOUS | Status: DC | PRN
  Administered 2024-04-13: 60 mg via INTRAVENOUS

## 2024-04-13 MED ORDER — HYDROMORPHONE HCL 1 MG/ML IJ SOLN
INTRAMUSCULAR | Status: AC
  Filled 2024-04-13: qty 1

## 2024-04-13 MED ORDER — METOCLOPRAMIDE HCL 5 MG/ML IJ SOLN: 5.0000 mg | Freq: Three times a day (TID) | INTRAMUSCULAR | Status: DC | PRN

## 2024-04-13 MED ORDER — PROPOFOL 10 MG/ML IV BOLUS
INTRAVENOUS | Status: DC | PRN
  Administered 2024-04-13: 120 mg via INTRAVENOUS

## 2024-04-13 MED ORDER — DEXAMETHASONE SODIUM PHOSPHATE 10 MG/ML IJ SOLN
INTRAMUSCULAR | Status: AC
  Filled 2024-04-13: qty 1

## 2024-04-13 MED ORDER — PHENYLEPHRINE HCL-NACL 20-0.9 MG/250ML-% IV SOLN
INTRAVENOUS | Status: AC
  Filled 2024-04-13: qty 500

## 2024-04-13 MED ORDER — EPHEDRINE SULFATE-NACL 50-0.9 MG/10ML-% IV SOSY
PREFILLED_SYRINGE | INTRAVENOUS | Status: DC | PRN
  Administered 2024-04-13: 10 mg via INTRAVENOUS

## 2024-04-13 MED ORDER — PHENYLEPHRINE 80 MCG/ML (10ML) SYRINGE FOR IV PUSH (FOR BLOOD PRESSURE SUPPORT)
PREFILLED_SYRINGE | INTRAVENOUS | Status: DC | PRN
  Administered 2024-04-13: 160 ug via INTRAVENOUS

## 2024-04-13 MED ORDER — MIDAZOLAM HCL 2 MG/2ML IJ SOLN
INTRAMUSCULAR | Status: DC | PRN
  Administered 2024-04-13: 2 mg via INTRAVENOUS

## 2024-04-13 MED ORDER — DROPERIDOL 2.5 MG/ML IJ SOLN: 0.6250 mg | Freq: Once | INTRAMUSCULAR | Status: DC | PRN

## 2024-04-13 MED ORDER — DOCUSATE SODIUM 100 MG PO CAPS
100.0000 mg | ORAL_CAPSULE | Freq: Two times a day (BID) | ORAL | Status: DC
  Administered 2024-04-13 – 2024-04-15 (×4): 100 mg via ORAL
  Filled 2024-04-13 (×5): qty 1

## 2024-04-13 MED ORDER — FENTANYL CITRATE (PF) 250 MCG/5ML IJ SOLN
INTRAMUSCULAR | Status: DC | PRN
  Administered 2024-04-13: 100 ug via INTRAVENOUS
  Administered 2024-04-13: 50 ug via INTRAVENOUS

## 2024-04-13 MED ORDER — SUGAMMADEX SODIUM 200 MG/2ML IV SOLN
INTRAVENOUS | Status: DC | PRN
  Administered 2024-04-13: 200 mg via INTRAVENOUS

## 2024-04-13 MED ORDER — ONDANSETRON HCL 4 MG/2ML IJ SOLN
INTRAMUSCULAR | Status: AC
  Filled 2024-04-13: qty 2

## 2024-04-13 MED ORDER — MAGNESIUM SULFATE 2 GM/50ML IV SOLN
2.0000 g | Freq: Once | INTRAVENOUS | Status: AC
  Administered 2024-04-13: 2 g via INTRAVENOUS
  Filled 2024-04-13: qty 50

## 2024-04-13 MED ORDER — CEFAZOLIN SODIUM-DEXTROSE 2-4 GM/100ML-% IV SOLN
2.0000 g | Freq: Three times a day (TID) | INTRAVENOUS | Status: AC
  Administered 2024-04-13 – 2024-04-14 (×3): 2 g via INTRAVENOUS
  Filled 2024-04-13 (×3): qty 100

## 2024-04-13 MED ORDER — ENOXAPARIN SODIUM 40 MG/0.4ML IJ SOSY
40.0000 mg | PREFILLED_SYRINGE | INTRAMUSCULAR | Status: DC
  Administered 2024-04-14 – 2024-04-18 (×5): 40 mg via SUBCUTANEOUS
  Filled 2024-04-13 (×5): qty 0.4

## 2024-04-13 MED ORDER — PHENYLEPHRINE HCL-NACL 20-0.9 MG/250ML-% IV SOLN
INTRAVENOUS | Status: DC | PRN
  Administered 2024-04-13: 50 ug/min via INTRAVENOUS

## 2024-04-13 MED ORDER — HYDROMORPHONE HCL 1 MG/ML IJ SOLN
0.2500 mg | INTRAMUSCULAR | Status: DC | PRN
  Administered 2024-04-13: 0.25 mg via INTRAVENOUS
  Administered 2024-04-13: 0.5 mg via INTRAVENOUS

## 2024-04-13 MED ORDER — ORAL CARE MOUTH RINSE: 15.0000 mL | Freq: Once | OROMUCOSAL | Status: AC

## 2024-04-13 MED ORDER — LIDOCAINE 2% (20 MG/ML) 5 ML SYRINGE
INTRAMUSCULAR | Status: AC
  Filled 2024-04-13: qty 5

## 2024-04-13 MED ORDER — LACTATED RINGERS IV SOLN: INTRAVENOUS | Status: DC

## 2024-04-13 MED ORDER — CEFAZOLIN SODIUM-DEXTROSE 2-4 GM/100ML-% IV SOLN
2.0000 g | Freq: Once | INTRAVENOUS | Status: AC
  Administered 2024-04-13: 2 g via INTRAVENOUS

## 2024-04-13 MED ORDER — GABAPENTIN 300 MG PO CAPS
300.0000 mg | ORAL_CAPSULE | Freq: Once | ORAL | Status: AC
  Administered 2024-04-13: 300 mg via ORAL
  Filled 2024-04-13: qty 1

## 2024-04-13 MED ORDER — CHLORHEXIDINE GLUCONATE 0.12 % MT SOLN: 15.0000 mL | Freq: Once | OROMUCOSAL | Status: AC

## 2024-04-13 MED ORDER — VITAMIN D (ERGOCALCIFEROL) 1.25 MG (50000 UNIT) PO CAPS
50000.0000 [IU] | ORAL_CAPSULE | ORAL | Status: DC
  Filled 2024-04-13: qty 1

## 2024-04-13 MED ORDER — CEFAZOLIN SODIUM-DEXTROSE 2-4 GM/100ML-% IV SOLN
INTRAVENOUS | Status: AC
  Filled 2024-04-13: qty 100

## 2024-04-13 MED ORDER — EPHEDRINE 5 MG/ML INJ
INTRAVENOUS | Status: AC
  Filled 2024-04-13: qty 5

## 2024-04-13 MED ORDER — ACETAMINOPHEN 500 MG PO TABS
1000.0000 mg | ORAL_TABLET | Freq: Four times a day (QID) | ORAL | Status: DC
  Administered 2024-04-13 – 2024-04-18 (×17): 1000 mg via ORAL
  Filled 2024-04-13 (×18): qty 2

## 2024-04-13 MED ORDER — POLYETHYLENE GLYCOL 3350 17 G PO PACK: 17.0000 g | PACK | Freq: Every day | ORAL | Status: DC | PRN

## 2024-04-13 MED ORDER — 0.9 % SODIUM CHLORIDE (POUR BTL) OPTIME
TOPICAL | Status: DC | PRN
  Administered 2024-04-13: 1000 mL

## 2024-04-13 MED ORDER — DIPHENHYDRAMINE HCL 12.5 MG/5ML PO ELIX: 12.5000 mg | ORAL_SOLUTION | ORAL | Status: DC | PRN

## 2024-04-13 MED ORDER — ENSURE MAX PROTEIN PO LIQD
11.0000 [oz_av] | Freq: Two times a day (BID) | ORAL | Status: DC
  Administered 2024-04-14 – 2024-04-18 (×6): 11 [oz_av] via ORAL
  Filled 2024-04-13 (×14): qty 330

## 2024-04-13 MED ORDER — ROCURONIUM BROMIDE 10 MG/ML (PF) SYRINGE
PREFILLED_SYRINGE | INTRAVENOUS | Status: AC
  Filled 2024-04-13: qty 10

## 2024-04-13 MED ORDER — ADULT MULTIVITAMIN W/MINERALS CH
1.0000 | ORAL_TABLET | Freq: Every day | ORAL | Status: DC
  Administered 2024-04-14 – 2024-04-18 (×5): 1 via ORAL
  Filled 2024-04-13 (×6): qty 1

## 2024-04-13 MED ORDER — TRANEXAMIC ACID-NACL 1000-0.7 MG/100ML-% IV SOLN
1000.0000 mg | Freq: Once | INTRAVENOUS | Status: AC
  Administered 2024-04-13: 1000 mg via INTRAVENOUS
  Filled 2024-04-13: qty 100

## 2024-04-13 MED ORDER — VANCOMYCIN HCL 1000 MG IV SOLR
INTRAVENOUS | Status: AC
  Filled 2024-04-13: qty 20

## 2024-04-13 MED ORDER — ONDANSETRON HCL 4 MG/2ML IJ SOLN
INTRAMUSCULAR | Status: DC | PRN
  Administered 2024-04-13: 4 mg via INTRAVENOUS

## 2024-04-13 MED ORDER — VANCOMYCIN HCL 1000 MG IV SOLR
INTRAVENOUS | Status: DC | PRN
  Administered 2024-04-13: 1000 mg via TOPICAL

## 2024-04-13 MED ORDER — METOCLOPRAMIDE HCL 5 MG PO TABS: 5.0000 mg | ORAL_TABLET | Freq: Three times a day (TID) | ORAL | Status: DC | PRN

## 2024-04-13 SURGICAL SUPPLY — 56 items
BAG COUNTER SPONGE SURGICOUNT (BAG) ×1 IMPLANT
BIT DRILL 4.3X300MM (BIT) IMPLANT
BIT DRILL LONG 3.3 (BIT) IMPLANT
BIT DRILL QC 3.3X195 (BIT) IMPLANT
BLADE CLIPPER SURG (BLADE) IMPLANT
BNDG COHESIVE 6X5 TAN ST LF (GAUZE/BANDAGES/DRESSINGS) ×1 IMPLANT
BNDG ELASTIC 6X10 VLCR STRL LF (GAUZE/BANDAGES/DRESSINGS) ×1 IMPLANT
BRUSH SCRUB EZ PLAIN DRY (MISCELLANEOUS) ×2 IMPLANT
CANISTER SUCTION 3000ML PPV (SUCTIONS) ×1 IMPLANT
CAP LOCK NCB (Cap) IMPLANT
CHLORAPREP W/TINT 26 (MISCELLANEOUS) ×1 IMPLANT
COVER SURGICAL LIGHT HANDLE (MISCELLANEOUS) ×1 IMPLANT
DRAPE C-ARM 42X72 X-RAY (DRAPES) ×1 IMPLANT
DRAPE C-ARMOR (DRAPES) ×1 IMPLANT
DRAPE HALF SHEET 40X57 (DRAPES) ×2 IMPLANT
DRAPE SURG 17X23 STRL (DRAPES) ×1 IMPLANT
DRAPE SURG ORHT 6 SPLT 77X108 (DRAPES) ×2 IMPLANT
DRAPE U-SHAPE 47X51 STRL (DRAPES) ×1 IMPLANT
DRESSING MEPILEX FLEX 4X4 (GAUZE/BANDAGES/DRESSINGS) IMPLANT
DRSG ADAPTIC 3X8 NADH LF (GAUZE/BANDAGES/DRESSINGS) IMPLANT
DRSG MEPILEX POST OP 4X12 (GAUZE/BANDAGES/DRESSINGS) IMPLANT
DRSG MEPILEX POST OP 4X8 (GAUZE/BANDAGES/DRESSINGS) IMPLANT
ELECTRODE REM PT RTRN 9FT ADLT (ELECTROSURGICAL) ×1 IMPLANT
GAUZE PAD ABD 8X10 STRL (GAUZE/BANDAGES/DRESSINGS) ×3 IMPLANT
GAUZE SPONGE 4X4 12PLY STRL (GAUZE/BANDAGES/DRESSINGS) ×1 IMPLANT
GLOVE BIO SURGEON STRL SZ 6.5 (GLOVE) ×3 IMPLANT
GLOVE BIO SURGEON STRL SZ7.5 (GLOVE) ×4 IMPLANT
GLOVE BIOGEL PI IND STRL 6.5 (GLOVE) ×1 IMPLANT
GLOVE BIOGEL PI IND STRL 7.5 (GLOVE) ×1 IMPLANT
GOWN STRL REUS W/ TWL LRG LVL3 (GOWN DISPOSABLE) ×3 IMPLANT
KIT BASIN OR (CUSTOM PROCEDURE TRAY) ×1 IMPLANT
KIT TURNOVER KIT B (KITS) ×1 IMPLANT
KWIRE FXSTD 280X2XNS SS (WIRE) IMPLANT
NS IRRIG 1000ML POUR BTL (IV SOLUTION) ×1 IMPLANT
PACK TOTAL JOINT (CUSTOM PROCEDURE TRAY) ×1 IMPLANT
PAD ARMBOARD POSITIONER FOAM (MISCELLANEOUS) ×1 IMPLANT
PAD CAST 4YDX4 CTTN HI CHSV (CAST SUPPLIES) ×1 IMPLANT
PADDING CAST COTTON 6X4 STRL (CAST SUPPLIES) ×1 IMPLANT
PLATE NCB 15H HIP (Plate) IMPLANT
SCREW 5.0 48MM (Screw) IMPLANT
SCREW CORTICAL NCB 5.0X44 (Screw) IMPLANT
SCREW NCB 3.5X75X5X6.2XST (Screw) IMPLANT
SCREW NCB 4.0MX44M (Screw) IMPLANT
SCREW NCB 4.0X40MM (Screw) IMPLANT
SCREW NCB 5.0X85MM (Screw) IMPLANT
SPONGE T-LAP 18X18 ~~LOC~~+RFID (SPONGE) IMPLANT
STAPLER SKIN PROX 35W (STAPLE) ×1 IMPLANT
SUCTION TUBE FRAZIER 10FR DISP (SUCTIONS) ×1 IMPLANT
SUT ETHILON 3 0 PS 1 (SUTURE) ×2 IMPLANT
SUT MON AB 2-0 CT1 36 (SUTURE) IMPLANT
SUT VIC AB 0 CT1 27XBRD ANBCTR (SUTURE) IMPLANT
SUT VIC AB 1 CT1 27XBRD ANBCTR (SUTURE) IMPLANT
SUT VIC AB 2-0 CT1 TAPERPNT 27 (SUTURE) ×2 IMPLANT
TOWEL GREEN STERILE (TOWEL DISPOSABLE) ×2 IMPLANT
TRAY FOLEY MTR SLVR 16FR STAT (SET/KITS/TRAYS/PACK) IMPLANT
WATER STERILE IRR 1000ML POUR (IV SOLUTION) ×2 IMPLANT

## 2024-04-13 NOTE — Consult Note (Signed)
 Orthopaedic Trauma Service (OTS) Consult   Patient ID: Kara Acevedo MRN: [id number] DOB/AGE: 02/07/1956 68 y.o.  Reason for Consult:Left periprosthetic distal femur fracture Referring Physician: Dr. Lillia Mountain, MD D[place]  HPI: Kara Acevedo is an 68 y.o. female who is being seen in consultation at the request of Dr.Ramanathan for evaluation of left periprosthetic distal femur fracture.  She sustained a ground-level fall and had the above injury.  She had a previous intramedullary nailing of her left hip with Dr. Fidel in 2023.  She has done well regarding this and has been ambulatory and has been exercising.  Denies any complaints of the left hip.  Due to complexity of her injury I was asked to take over and manage her distal femur.  There was concerned about vascular compromise but CTA was within normal limits and currently she denies any numbness or tingling in she has got a palpable pulse.  She lives with family members.  She states that she is ambulatory without assist device.  Past Medical History:  Diagnosis Date   Arthritis    Hypertension     Past Surgical History:  Procedure Laterality Date   INTRAMEDULLARY (IM) NAIL INTERTROCHANTERIC Left 02/24/2022   Procedure: INTRAMEDULLARY (IM) NAIL INTERTROCHANTRIC;  Surgeon: Fidel Rogue, MD;  Location: [facility];  Service: Orthopedics;  Laterality: Left;    Family History  Family history unknown: Yes    Social History:  reports that she has never smoked. She has never used smokeless tobacco. She reports that she does not drink alcohol  and does not use drugs.  Allergies:  Allergies  Allergen Reactions   Doxycycline Nausea And Vomiting   Latex Other (See Comments)    Blisters    Sulfa Antibiotics Rash   Fluconazole Other (See Comments)    Feels like a burning sensation from inside out   Nickel Swelling and Dermatitis    Blistering   Ace Inhibitors Cough    Medications:  No current  facility-administered medications on file prior to encounter.   Current Outpatient Medications on File Prior to Encounter  Medication Sig Dispense Refill   amLODipine  (NORVASC ) 5 MG tablet Take 1 tablet (5 mg total) by mouth daily. Please hold this medication if your systolic blood pressure ( top number) is less than 100. (Patient taking differently: Take 5 mg by mouth daily.)     amphetamine-dextroamphetamine (ADDERALL) 20 MG tablet Take 20 mg by mouth daily.     ibuprofen (ADVIL,MOTRIN) 800 MG tablet Take 800 mg by mouth daily as needed for moderate pain (pain score 4-6) or mild pain (pain score 1-3).     indapamide  (LOZOL ) 2.5 MG tablet Take 1 tablet (2.5 mg total) by mouth daily as needed. edema (Patient taking differently: Take 2.5 mg by mouth daily.)     ondansetron  (ZOFRAN ) 4 MG tablet Take 4 mg by mouth daily as needed for nausea or vomiting.     potassium chloride  SA (KLOR-CON  M) 20 MEQ tablet Take 20 mEq by mouth 3 (three) times daily.     tirzepatide (ZEPBOUND) 2.5 MG/0.5ML Pen Inject 2.5 mg into the skin once a week. Wednesday     cyanocobalamin  (VITAMIN B12) 1000 MCG/ML injection 1 ml Injection once monthly; Duration: 90 days (Patient not taking: Reported on 04/13/2024)     hydrocortisone 2.5 % cream Apply topically. (Patient not taking: Reported on 04/13/2024)     morphine  (MSIR) 15 MG tablet Take 0.5 tablets (7.5 mg total) by mouth every 4 (four) hours as needed. (  Patient not taking: Reported on 04/13/2024) 7 tablet 0   traMADol (ULTRAM) 50 MG tablet Take 50 mg by mouth daily as needed. (Patient not taking: Reported on 04/13/2024)       ROS: Constitutional: No fever or chills Vision: No changes in vision ENT: No difficulty swallowing CV: No chest pain Pulm: No SOB or wheezing GI: No nausea or vomiting GU: No urgency or inability to hold urine Skin: No poor wound healing Neurologic: No numbness or tingling Psychiatric: No depression or anxiety Heme: No bruising Allergic: No reaction  to medications or food   Exam: Blood pressure 116/67, pulse 80, temperature 99 F (37.2 C), temperature source Oral, resp. rate 18, height 5' 5 (1.651 m), weight 97.5 kg, SpO2 99%. General: No acute distress Orientation: Awake alert and oriented x 3 Mood and Affect: Cooperative and pleasant Gait: Unable to assess due to her fracture Coordination and balance: Within normal limits  Left lower extremity: Leg is shortened and has a mild deformity through the knee.  Unable to tolerate any motion.  Compartments are soft compressible.  She is warm well-perfused foot with 2+ DP and PT pulses.  She has brisk cap refill.  She is able to dorsiflex and plantarflex her foot and ankle.  Sensation is intact to light touch.  Right lower extremity: Skin without lesions. No tenderness to palpation. Full painless ROM, full strength in each muscle groups without evidence of instability.   Medical Decision Making: Data: Imaging: X-rays of the left femur and CT scan were reviewed which shows a comminuted supracondylar distal femur fracture.  Previous cephalomedullary nail is in place.  There is been some interval collapse of the screw in her femoral head but the fracture appears to be healed completely.  Labs:  Results for orders placed or performed during the hospital encounter of 04/12/24 (from the past 24 hours)  Basic metabolic panel     Status: Abnormal   Collection Time: 04/12/24 11:57 AM  Result Value Ref Range   Sodium 140 135 - 145 mmol/L   Potassium 3.7 3.5 - 5.1 mmol/L   Chloride 107 98 - 111 mmol/L   CO2 21 (L) 22 - 32 mmol/L   Glucose, Bld 119 (H) 70 - 99 mg/dL   BUN 11 8 - 23 mg/dL   Creatinine, Ser 9.19 0.44 - 1.00 mg/dL   Calcium 9.1 8.9 - 89.6 mg/dL   GFR, Estimated >39 >39 mL/min   Anion gap 12 5 - 15  CBC     Status: None   Collection Time: 04/12/24 11:57 AM  Result Value Ref Range   WBC 5.8 4.0 - 10.5 K/uL   RBC 4.18 3.87 - 5.11 MIL/uL   Hemoglobin 13.1 12.0 - 15.0 g/dL   HCT  60.9 63.9 - 53.9 %   MCV 93.3 80.0 - 100.0 fL   MCH 31.3 26.0 - 34.0 pg   MCHC 33.6 30.0 - 36.0 g/dL   RDW 86.8 88.4 - 84.4 %   Platelets 239 150 - 400 K/uL   nRBC 0.0 0.0 - 0.2 %  I-stat chem 8, ED     Status: Abnormal   Collection Time: 04/12/24 12:17 PM  Result Value Ref Range   Sodium 140 135 - 145 mmol/L   Potassium 4.4 3.5 - 5.1 mmol/L   Chloride 108 98 - 111 mmol/L   BUN 14 8 - 23 mg/dL   Creatinine, Ser 9.19 0.44 - 1.00 mg/dL   Glucose, Bld 890 (H) 70 - 99 mg/dL  Calcium, Ion 1.10 (L) 1.15 - 1.40 mmol/L   TCO2 23 22 - 32 mmol/L   Hemoglobin 12.6 12.0 - 15.0 g/dL   HCT 62.9 63.9 - 53.9 %  CK     Status: None   Collection Time: 04/12/24  9:37 PM  Result Value Ref Range   Total CK 89 38 - 234 U/L  Magnesium      Status: Abnormal   Collection Time: 04/12/24  9:37 PM  Result Value Ref Range   Magnesium  1.6 (L) 1.7 - 2.4 mg/dL  Hepatic function panel     Status: Abnormal   Collection Time: 04/12/24  9:37 PM  Result Value Ref Range   Total Protein 5.7 (L) 6.5 - 8.1 g/dL   Albumin  2.8 (L) 3.5 - 5.0 g/dL   AST 17 15 - 41 U/L   ALT 14 0 - 44 U/L   Alkaline Phosphatase 53 38 - 126 U/L   Total Bilirubin 0.5 0.0 - 1.2 mg/dL   Bilirubin, Direct 0.1 0.0 - 0.2 mg/dL   Indirect Bilirubin 0.4 0.3 - 0.9 mg/dL  Phosphorus     Status: None   Collection Time: 04/12/24  9:37 PM  Result Value Ref Range   Phosphorus 3.9 2.5 - 4.6 mg/dL  ABO/Rh     Status: None   Collection Time: 04/12/24  9:37 PM  Result Value Ref Range   ABO/RH(D)      A POS Performed at [facility], [address] [place], [place] [address]   Type and screen     Status: None   Collection Time: 04/13/24 12:09 AM  Result Value Ref Range   ABO/RH(D) A POS    Antibody Screen NEG    Sample Expiration      04/16/2024,2359 Performed at [facility], [address], [place], [place] [address]   HIV Antibody (routine testing w rflx)     Status: None   Collection Time: 04/13/24 12:13 AM  Result Value  Ref Range   HIV Screen 4th Generation wRfx Non Reactive Non Reactive  CBC     Status: None   Collection Time: 04/13/24  2:15 AM  Result Value Ref Range   WBC 6.4 4.0 - 10.5 K/uL   RBC 3.96 3.87 - 5.11 MIL/uL   Hemoglobin 12.3 12.0 - 15.0 g/dL   HCT 63.3 63.9 - 53.9 %   MCV 92.4 80.0 - 100.0 fL   MCH 31.1 26.0 - 34.0 pg   MCHC 33.6 30.0 - 36.0 g/dL   RDW 86.9 88.4 - 84.4 %   Platelets 221 150 - 400 K/uL   nRBC 0.0 0.0 - 0.2 %  VITAMIN D  25 Hydroxy (Vit-D Deficiency, Fractures)     Status: Abnormal   Collection Time: 04/13/24  2:15 AM  Result Value Ref Range   Vit D, 25-Hydroxy 28.98 (L) 30 - 100 ng/mL  Comprehensive metabolic panel     Status: Abnormal   Collection Time: 04/13/24  2:15 AM  Result Value Ref Range   Sodium 138 135 - 145 mmol/L   Potassium 3.4 (L) 3.5 - 5.1 mmol/L   Chloride 105 98 - 111 mmol/L   CO2 25 22 - 32 mmol/L   Glucose, Bld 100 (H) 70 - 99 mg/dL   BUN 6 (L) 8 - 23 mg/dL   Creatinine, Ser 9.23 0.44 - 1.00 mg/dL   Calcium 8.8 (L) 8.9 - 10.3 mg/dL   Total Protein 5.8 (L) 6.5 - 8.1 g/dL   Albumin   2.9 (L) 3.5 - 5.0 g/dL   AST 18 15 - 41 U/L   ALT 14 0 - 44 U/L   Alkaline Phosphatase 57 38 - 126 U/L   Total Bilirubin 0.8 0.0 - 1.2 mg/dL   GFR, Estimated >39 >39 mL/min   Anion gap 8 5 - 15     Imaging or Labs ordered: None  Medical history and chart was reviewed and case discussed with medical provider.  Assessment/Plan: 68 year old female with periprosthetic supracondylar distal femur fracture.  Due to the unstable nature of her injury I recommend proceeding with open reduction internal fixation.  Risks include but not limited to bleeding, infection, malunion, nonunion, hardware failure, hardware irritation, nerve or blood vessel injury, DVT, even the possibility anesthetic complications.  She agreed to proceed with surgery and consent was obtained.  [name], MD [facility] [phone] (office) [email or web address]

## 2024-04-13 NOTE — Anesthesia Procedure Notes (Signed)
 Procedure Name: Intubation Date/Time: 04/13/2024 12:15 PM  Performed by: Darlyn Rush, MDPre-anesthesia Checklist: Patient identified, Emergency Drugs available, Suction available, Patient being monitored and Timeout performed Patient Re-evaluated:Patient Re-evaluated prior to induction Oxygen Delivery Method: Circle system utilized Preoxygenation: Pre-oxygenation with 100% oxygen Induction Type: IV induction Ventilation: Mask ventilation without difficulty Laryngoscope Size: Mac and 3 Grade View: Grade II Tube type: Oral Tube size: 7.0 mm Number of attempts: 1 Airway Equipment and Method: Stylet Placement Confirmation: ETT inserted through vocal cords under direct vision, positive ETCO2, CO2 detector and breath sounds checked- equal and bilateral Secured at: 22 cm Tube secured with: Tape Dental Injury: Teeth and Oropharynx as per pre-operative assessment  Comments: Intubated by NORVA Speed under MD/DO and CRNA supervision

## 2024-04-13 NOTE — Progress Notes (Signed)
 [facility] Progress Note Patient: Kara Acevedo FMW:[id number] DOB: Mar 14, 1956  DOA: 04/12/2024 DOS: the patient was seen and examined on 04/13/2024  Brief Hospital Course: PMH of HTN, neuropathy, obesity, ADHD presents to the hospital with complaints of a mechanical fall. Was found to have periprosthetic fracture of left distal femur. Orthopedic consulted. Assessment and Plan: Left periprosthetic distal femur fracture. From mechanical fall Status post ORIF by Dr. Kendal on 9/3. WBAT left lower extremity. Lovenox  for DVT prophylaxis and discharge on oral DOAC's. PT OT consulted. Low vit d , started supplement   Recent rib fractures. F/u with pcp   Essential hypertension Bp low normal without home bp meds Holding home medication.   Hypokalemia Replaced   ADHD Adderall held since admission , resumed at discharge   Obesity Class 2 Body mass index is 35.78 kg/m.  Placing the pt at higher risk of poor outcomes. On Zepbound at home for weight management  Reports h/o weight loss surgery in 2014 Follow-up with PCP   Subjective: Reports pain present.  No nausea no vomiting no fever no chills.  Passing gas.  Physical Exam: Clear to auscultation. S1-S2 present. Aortic systolic murmur present.  Bowel sound present. No edema.  Data Reviewed: I have Reviewed nursing notes, Vitals, and Lab results. Since last encounter, pertinent lab results CBC and BMP   . I have ordered test including CBC and BMP  .  Disposition: Status is: Inpatient Remains inpatient appropriate because: Monitor postop recovery  enoxaparin  (LOVENOX ) injection 40 mg Start: 04/14/24 0800 SCDs Start: 04/13/24 1457 SCDs Start: 04/12/24 2148   Family Communication: None at bedside Level of care: Med-Surg   Vitals:   04/13/24 1413 04/13/24 1415 04/13/24 1430 04/13/24 1500  BP:  (!) 116/57 117/63 128/63  Pulse: 81 81 80 86  Resp: 15 14 13 16   Temp:   98.1 F (36.7 C) 98.5 F (36.9 C)   TempSrc:    Oral  SpO2: 95% 95% 95% 98%  Weight:      Height:         Author: Yetta Blanch, MD 04/13/2024 3:32 PM  Please look on [email or web address] to find out who is on call.

## 2024-04-13 NOTE — Progress Notes (Addendum)
 Initial Nutrition Assessment  DOCUMENTATION CODES:   Obesity unspecified  INTERVENTION:  Encourager PO intake - recommend regular diet after surgery  Ensure Max po BID, each supplement provides 150 kcal and 30 grams of protein.  1 packet Juven BID, each packet provides 95 calories, 2.5 grams of protein (collagen), to support wound healing MVI with minerals daily  50,000 units orally once weekly for 8 weeks then maintenance daily of 1,000-2,000 units daily  Provided high calorie, high protein handout in AVS. With emphasis on adequate protein intake.   NUTRITION DIAGNOSIS:   Increased nutrient needs related to hip fracture, post-op healing as evidenced by estimated needs.  GOAL:   Patient will meet greater than or equal to 90% of their needs  MONITOR:   PO intake, Supplement acceptance, Diet advancement, Labs, I & O's  REASON FOR ASSESSMENT:   Consult Hip fracture protocol, Assessment of nutrition requirement/status  ASSESSMENT:  68 y.o. female with PMH of arthritis, HTN, ADD neuropathy, bariatric surgery, left hip fracture 2023 s/p intramedullary nail. Presented to ED after fall with left leg pain. Admitted for peri-prosthetic femoral shaft fracture.  9/2 - CT: Acute comminuted fracture of the distal left femur at the level of the knee prosthesis, cholelithiasis, sigmoid colon diverticulosis  9/3 - S/P ORIF of left hip  Pt resting in bed, awaiting surgery. She reports she had a Gastric sleeve in 2014 and is now on zepbound for weight loss. She states she has intentionally lost 35 lbs within the last year. Typically eats 2 meals per day and snacks on berries in between. Does drink 2 [employer] Elite protein shakes per day. Each shakes contains 230 kcal and 42 gm protein. Does not take a MVI daily. Pt states she does not eat foods such as potatoes, rice, bread, pasta as they make her belly swell and she reports early satiety from them. Pt with mild muscle depletions. Pt was  able to do all ADL PTA. Able to walk a flight of stairs or 100 feet (wlaks 4 miles a day). Had nausea this morning, no vomiting or abdominal pain noted.   Pt will have increased nutritional needs related to post op healing. Encouraged pt to increase calorie and protein intake and deterred pt from weight loss at this time. Pt hesitant as she does not want to gain weight. RD provided education on why weight loss at this time is not desirable. Pt agreeable, agreeable to trying Ensure Max and Juven while here. Vitamin D  low, supplementing. If intake low may switch to Ensure High Protein.   Dietary Recall: Breakfast: Oatmeal with berries  Lunch: Salad with a protein  Dinner: Protein and vegetables  Snacks: Berries, Triscuits with Salsa  Supplements: 2 [employer] coach protein shake   Admit weight: 97.5 kg  Current weight: 97.5 kg   Average Meal Intake: NPO  Nutritionally Relevant Medications: Scheduled Meds:  chlorhexidine        multivitamin with minerals  1 tablet Oral Daily   [START ON 04/14/2024] nutrition supplement (JUVEN)  1 packet Oral BID BM   [START ON 04/14/2024] Ensure Max Protein  11 oz Oral BID   Vitamin D  (Ergocalciferol )  50,000 Units Oral Q7 days   Continuous Infusions:  ceFAZolin      lactated ringers      potassium chloride  10 mEq (04/13/24 0938)   Labs Reviewed: Potassium 3.4 BUN 6  Calcium 8.8 Vitamin D  28.89 CBG ranges from 100-109 mg/dL over the last 24 hours No A1C  NUTRITION - FOCUSED  PHYSICAL EXAM:  Flowsheet Row Most Recent Value  Orbital Region No depletion  Upper Arm Region No depletion  Thoracic and Lumbar Region No depletion  Buccal Region No depletion  Temple Region No depletion  Clavicle Bone Region Mild depletion  Clavicle and Acromion Bone Region Mild depletion  Scapular Bone Region No depletion  Dorsal Hand No depletion  Patellar Region No depletion  Anterior Thigh Region No depletion  Posterior Calf Region No depletion  Edema (RD  Assessment) None  Hair Reviewed  Eyes Reviewed  Mouth Reviewed  Skin Reviewed  Nails Reviewed    Diet Order:   Diet Order             Diet NPO time specified  Diet effective now                   EDUCATION NEEDS:   Education needs have been addressed  Skin:  Skin Assessment: Reviewed RN Assessment  Last BM:  PTA  Height:   Ht Readings from Last 1 Encounters:  04/12/24 5' 5 (1.651 m)    Weight:   Wt Readings from Last 1 Encounters:  04/12/24 97.5 kg    Ideal Body Weight:  56.8 kg  BMI:  Body mass index is 35.78 kg/m.  Estimated Nutritional Needs:   Kcal:  2100-2400 kcal  Protein:  120-140 gm  Fluid:  >2L/day   Olivia Kenning, RD Registered Dietitian  See Amion for more information

## 2024-04-13 NOTE — Progress Notes (Signed)
 PT Cancellation Note  Patient Details Name: Kara Acevedo MRN: [id number] DOB: 11/03/1955   Cancelled Treatment:    Reason Eval/Treat Not Completed: Fatigue/lethargy limiting ability to participate;Patient declined, no reason specified (PT consult appreciated and chart reviewed. Introduced self and purpose of PT evaluation. Pt politely refused at this time reporting she is still groggy after coming out of surgery and wound like to eat and rest before attempting therapy.) Will follow-up for PT evaluation as schedule permits.   Randall SAUNDERS, PT, DPT Acute Rehabilitation Services Office: [phone] Secure Chat Preferred  Kara Acevedo 04/13/2024, 4:19 PM

## 2024-04-13 NOTE — Transfer of Care (Signed)
 Immediate Anesthesia Transfer of Care Note  Patient: Kara Acevedo  Procedure(s) Performed: OPEN REDUCTION INTERNAL FIXATION (ORIF) DISTAL FEMUR FRACTURE, LEFT (Left: Leg Upper)  Patient Location: PACU  Anesthesia Type:General  Level of Consciousness: awake and alert   Airway & Oxygen Therapy: Patient Spontanous Breathing and Patient connected to face mask oxygen  Post-op Assessment: Report given to RN and Post -op Vital signs reviewed and stable  Post vital signs: Reviewed and stable  Last Vitals:  Vitals Value Taken Time  BP 137/63 04/13/24 13:30  Temp    Pulse    Resp 15 04/13/24 13:35  SpO2    Vitals shown include unfiled device data.  Last Pain:  Vitals:   04/13/24 1043  TempSrc:   PainSc: 3       Patients Stated Pain Goal: 1 (04/12/24 2107)  Complications: No notable events documented.

## 2024-04-13 NOTE — Hospital Course (Addendum)
 PMH of HTN, neuropathy, obesity, ADHD presents to the hospital with complaints of a mechanical fall. Was found to have periprosthetic fracture of left distal femur. Orthopedic consulted. Assessment and Plan: Left periprosthetic distal femur fracture. From mechanical fall Status post ORIF by Dr. Kendal on 9/3. WBAT left lower extremity. Lovenox  for DVT prophylaxis and discharge on oral DOAC's. PT OT consulted. Low vit d , started supplement   Recent rib fractures. Seen in the ED on 7/18.  Outpatient follow-up.  Pain controlled.   Essential hypertension BP normal. Holding home Norvasc  and indapamide .  Initiating HCTZ as a milder version of diuretic   Hypokalemia Replaced   ADHD Adderall held since admission , resumed at discharge   Obesity Class 2 Body mass index is 35.78 kg/m.  Placing the pt at higher risk of poor outcomes. On Zepbound at home for weight management  Reports h/o weight loss surgery in 2014 Follow-up with PCP  Constipation  Modified bowel regimen   Acute postop blood loss anemia. Baseline hemoglobin around 12.  Currently hemoglobin 10.  Stable. Monitor

## 2024-04-13 NOTE — Op Note (Signed)
 Orthopaedic Surgery Operative Note (CSN: [id number] ) Date of Surgery: 04/13/2024  Admit Date: 04/12/2024   Diagnoses: Pre-Op Diagnoses: Left periprosthetic distal femur fracture   Post-Op Diagnosis: Same  Procedures: CPT 27511-Open reduction internal fixation of left supracondylar distal femur fracture  Surgeons : Primary: Kendal Franky SQUIBB, MD  Assistant: Lauraine Moores, PA-C  Location: OR 3   Anesthesia: General   Antibiotics: Ancef  2g preop with 1 gm vancomycin  powder placed topically   Tourniquet time: None    Estimated Blood Loss: 50 mL  Complications:None  Specimens:* No specimens in log *   Implants: Implant Name Type Inv. Item Serial No. Manufacturer Lot No. LRB No. Used Action  CAP LOCK NCB - [id number] Cap CAP LOCK NCB  [employer](ORTH,TRAU,BIO,SG)  Left 8 Implanted  PLATE NCB 84Y HIP - [id number] Plate PLATE NCB 84Y HIP  [employer](ORTH,TRAU,BIO,SG)  Left 1 Implanted  SCREW 5.0 - [id number] Screw SCREW 5.0  [employer](ORTH,TRAU,BIO,SG)  Left 1 Implanted  SCREW CORTICAL NCB 5.0X44 - [id number] Screw SCREW CORTICAL NCB 5.0X44  [employer](ORTH,TRAU,BIO,SG)  Left 1 Implanted  SCREW NCB 6.4K24K4K3.2XST - [id number] Screw SCREW NCB [id number]  [employer](ORTH,TRAU,BIO,SG)  Left 2 Implanted  SCREW NCB 5.0X85MM - [id number] Screw SCREW NCB 5.0X85MM  [employer](ORTH,TRAU,BIO,SG)  Left 3 Implanted  SCREW NCB 4.0X40MM - [id number] Screw SCREW NCB 4.0X40MM  [employer](ORTH,TRAU,BIO,SG)  Left 1 Implanted  SCREW NCB 4.9FK55F - [id number] Screw SCREW NCB 4.9FK55F  [employer](ORTH,TRAU,BIO,SG)  Left 1 Implanted     Indications for Surgery: 68 year old female who sustained a ground-level fall with a left periprosthetic distal femur fracture.  Due to the unstable nature of her injury I recommend proceeding with open reduction internal fixation.  Risks and benefits were discussed with the patient.  Risks include but not limited to bleeding, infection,  malunion, nonunion, hardware failure, hardware irritation, nerve and blood vessel injury, knee stiffness, DVT, even the possibility anesthetic complications.  She agreed to proceed with surgery and consent was obtained.  Operative Findings: 1.  Open reduction internal fixation of left periprosthetic supracondylar distal femur fracture using [employer] NCB distal femoral locking plate.  Procedure: The patient was identified in the preoperative holding area. Consent was confirmed with the patient and their family and all questions were answered. The operative extremity was marked after confirmation with the patient. she was then brought back to the operating room by our anesthesia colleagues.  She was placed under general anesthetic and carefully transferred over to radiolucent flattop table.  A bump was placed under her operative hip.  The left lower extremity was then prepped and draped in usual sterile fashion.  A timeout was performed to verify the patient, the procedure, and the extremity.  Preoperative antibiotics were dosed.  The hip and knee were flexed over a triangle fluoroscopic imaging was obtained to show the unstable nature of her injury.  Alignment was maintained and traction was applied.  Lateral approach to the distal femur was made and carried down through skin and subcutaneous tissue.  I incised through the IT band and expose the lateral condyle of the femur.  I then mobilized the vastus and proceeded to attach a 15 hole [employer] NCB distal femoral locking plate to a targeting arm and slid submuscularly along the lateral cortex of the femur.  I held it provisionally distally with a 2.0 mm K wire and proximally I held it just distal to the previous cephalomedullary nail with a 3.3 mm drill bit.  I  then returned to the distal segment and proceeded to drill and placed 5.0 millimeter screws to bring the plate flush to bone.  I then percutaneously placed 5.0 millimeter screws into the  femoral shaft and corrected the coronal alignment.  I then removed the 3.3 mm drill bit and placed a 4.0 millimeter screw.  I then was able to place a bicortical screw posterior to the cephalomedullary nail in the proximal screw hole.  Locking caps were placed on the 3 proximal femoral shaft screws.  The targeting arm was removed and I proceeded to drill and placed 5.0 millimeter screws distally.  A total of 5 were placed.  Locking caps were placed in all the screws.  Final fluoroscopic imaging was obtained.  The incisions were copiously irrigated.  A gram of vancomycin  powder was placed into the incision.  Layered closure of 0 Vicryl, 2-0 Monocryl and 3-0 nylon was used to close the skin.  Sterile dressing was applied.  The patient was then awoke from anesthesia and taken to the PACU in stable condition.  Post Op Plan/Instructions: Patient will be weightbearing as tolerated to the left lower extremity.  She will receive postoperative Ancef .  She will receive Lovenox  for DVT prophylaxis and discharged on oral DOAC.  Will have her mobilize with physical and Occupational Therapy.  I was present and performed the entire surgery.  Lauraine Moores, PA-C did assist me throughout the case. An assistant was necessary given the difficulty in approach, maintenance of reduction and ability to instrument the fracture.   Franky Light, MD [facility]

## 2024-04-13 NOTE — Anesthesia Preprocedure Evaluation (Addendum)
 Anesthesia Evaluation  Patient identified by MRN, date of birth, ID band Patient awake    Reviewed: Allergy & Precautions, NPO status , Patient's Chart, lab work & pertinent test results  History of Anesthesia Complications Negative for: history of anesthetic complications  Airway Mallampati: II  TM Distance: >3 FB Neck ROM: Full    Dental  (+) Dental Advisory Given, Edentulous Upper, Partial Lower, Missing   Pulmonary neg pulmonary ROS   Pulmonary exam normal breath sounds clear to auscultation       Cardiovascular hypertension, Pt. on medications Normal cardiovascular exam Rhythm:Regular Rate:Normal     Neuro/Psych negative neurological ROS     GI/Hepatic negative GI ROS, Neg liver ROS,neg GERD  ,,  Endo/Other  negative endocrine ROS    Renal/GU negative Renal ROS     Musculoskeletal  (+) Arthritis ,    Abdominal  (+) + obese  Peds  Hematology negative hematology ROS (+)   Anesthesia Other Findings   Reproductive/Obstetrics                              Anesthesia Physical Anesthesia Plan  ASA: 2  Anesthesia Plan: General   Post-op Pain Management: Tylenol  PO (pre-op)* and Gabapentin  PO (pre-op)*   Induction: Intravenous  PONV Risk Score and Plan: 2 and Propofol  infusion, Midazolam  and Ondansetron   Airway Management Planned: Oral ETT  Additional Equipment:   Intra-op Plan:   Post-operative Plan: Extubation in OR  Informed Consent: I have reviewed the patients History and Physical, chart, labs and discussed the procedure including the risks, benefits and alternatives for the proposed anesthesia with the patient or authorized representative who has indicated his/her understanding and acceptance.     Dental advisory given  Plan Discussed with: CRNA  Anesthesia Plan Comments:          Anesthesia Quick Evaluation

## 2024-04-14 ENCOUNTER — Encounter (HOSPITAL_COMMUNITY): Payer: Self-pay | Admitting: Student

## 2024-04-14 DIAGNOSIS — M978XXA Periprosthetic fracture around other internal prosthetic joint, initial encounter: Secondary | ICD-10-CM | POA: Diagnosis not present

## 2024-04-14 DIAGNOSIS — Z96649 Presence of unspecified artificial hip joint: Secondary | ICD-10-CM | POA: Diagnosis not present

## 2024-04-14 LAB — CBC
HCT: 33.6 % — ABNORMAL LOW (ref 36.0–46.0)
Hemoglobin: 11.2 g/dL — ABNORMAL LOW (ref 12.0–15.0)
MCH: 31.1 pg (ref 26.0–34.0)
MCHC: 33.3 g/dL (ref 30.0–36.0)
MCV: 93.3 fL (ref 80.0–100.0)
Platelets: 212 K/uL (ref 150–400)
RBC: 3.6 MIL/uL — ABNORMAL LOW (ref 3.87–5.11)
RDW: 12.7 % (ref 11.5–15.5)
WBC: 7.8 K/uL (ref 4.0–10.5)
nRBC: 0 % (ref 0.0–0.2)

## 2024-04-14 LAB — BASIC METABOLIC PANEL WITH GFR
Anion gap: 9 (ref 5–15)
BUN: 9 mg/dL (ref 8–23)
CO2: 24 mmol/L (ref 22–32)
Calcium: 8.4 mg/dL — ABNORMAL LOW (ref 8.9–10.3)
Chloride: 104 mmol/L (ref 98–111)
Creatinine, Ser: 0.84 mg/dL (ref 0.44–1.00)
GFR, Estimated: >60 mL/min (ref >60)
Glucose, Bld: 130 mg/dL — ABNORMAL HIGH (ref 70–99)
Potassium: 4.2 mmol/L (ref 3.5–5.1)
Sodium: 137 mmol/L (ref 135–145)

## 2024-04-14 MED ORDER — VITAMIN D 25 MCG (1000 UNIT) PO TABS: 1000.0000 [IU] | ORAL_TABLET | Freq: Every day | ORAL | Status: DC

## 2024-04-14 NOTE — Anesthesia Postprocedure Evaluation (Signed)
 Anesthesia Post Note  Patient: Kara Acevedo  Procedure(s) Performed: OPEN REDUCTION INTERNAL FIXATION (ORIF) DISTAL FEMUR FRACTURE, LEFT (Left: Leg Upper)     Patient location during evaluation: PACU Anesthesia Type: General Level of consciousness: sedated and patient cooperative Pain management: pain level controlled Vital Signs Assessment: post-procedure vital signs reviewed and stable Respiratory status: spontaneous breathing Cardiovascular status: stable Anesthetic complications: no   No notable events documented.  Last Vitals:  Vitals:   04/14/24 0610 04/14/24 0834  BP: 119/71 139/66  Pulse: 80 82  Resp: 18 17  Temp: 36.9 C 37 C  SpO2: 97% 96%    Last Pain:  Vitals:   04/14/24 0610  TempSrc: Oral  PainSc:                  [name]

## 2024-04-14 NOTE — Evaluation (Signed)
 Physical Therapy Evaluation Patient Details Name: Kara Acevedo MRN: [id number] DOB: 08-20-1955 Today's Date: 04/14/2024  History of Present Illness  Pt is a 68 y/o F admitted on 04/12/24 after presenting with c/o a fall. Pt found to have periprosthetic fx of L distal femur. Pt is s/p ORIF on 04/13/24. PMH: HTN, neuropathy, obesity, ADHD  Clinical Impression  Pt seen for PT evaluation with pt agreeable. Pt reports prior to admission she was living in full basement with level entry, ambulatory without AD, very active & going to the gym. On this date, pt requires min assist for supine>sit with hospital bed features & ambulates short distance gait in room with RW & CGA with impaired gait pattern as noted below. Pt c/o blurry vision but VSS. Recommend ongoing PT treatment to progress mobility as able.         If plan is discharge home, recommend the following: A little help with bathing/dressing/bathroom;A little help with walking and/or transfers;Assistance with cooking/housework;Assist for transportation;Help with stairs or ramp for entrance   Can travel by private vehicle   Yes    Equipment Recommendations Rolling walker (2 wheels);BSC/3in1  Recommendations for Other Services       Functional Status Assessment Patient has had a recent decline in their functional status and demonstrates the ability to make significant improvements in function in a reasonable and predictable amount of time.     Precautions / Restrictions Precautions Precautions: Fall Restrictions Weight Bearing Restrictions Per Provider Order: Yes LLE Weight Bearing Per Provider Order: Weight bearing as tolerated      Mobility  Bed Mobility Overal bed mobility: Needs Assistance Bed Mobility: Supine to Sit     Supine to sit: Min assist, HOB elevated, Used rails (exit R side of bed, assistance to move LLE to EOB)          Transfers Overall transfer level: Needs assistance Equipment used: Rolling  walker (2 wheels) Transfers: Sit to/from Stand, Bed to chair/wheelchair/BSC Sit to Stand: Contact guard assist   Step pivot transfers: Min assist (bed>Recliner on R with RW)       General transfer comment: sit>stand from EOB & recliner with cuing re: hand placement    Ambulation/Gait Ambulation/Gait assistance: Contact guard assist Gait Distance (Feet): 18 Feet Assistive device: Rolling walker (2 wheels) Gait Pattern/deviations: Decreased stance time - left, Decreased weight shift to left, Decreased stride length, Decreased step length - right, Decreased step length - left, Step-to pattern Gait velocity: decreased        Stairs            Wheelchair Mobility     Tilt Bed    Modified Rankin (Stroke Patients Only)       Balance Overall balance assessment: Needs assistance Sitting-balance support: Feet supported, No upper extremity supported Sitting balance-Leahy Scale: Fair     Standing balance support: During functional activity, Bilateral upper extremity supported, Reliant on assistive device for balance Standing balance-Leahy Scale: Poor                               Pertinent Vitals/Pain Pain Assessment Pain Assessment: 0-10 Pain Score: 4  Pain Location: LLE (increasing to 6/10 with mobility) Pain Descriptors / Indicators: Discomfort, Grimacing Pain Intervention(s): Monitored during session, Limited activity within patient's tolerance, Repositioned    Home Living Family/patient expects to be discharged to:: Private residence Living Arrangements: Children;Other relatives (daughter & son-in-law) Available Help at Discharge: Family;Available PRN/intermittently  Type of Home: House Home Access: Level entry     Alternate Level Stairs-Number of Steps: flight Home Layout: Two level;Other (Comment) Home Equipment: Shower seat      Prior Function Prior Level of Function : Independent/Modified Independent;Driving;History of Falls (last six  months)             Mobility Comments: Ind; one fall leading to this admission ADLs Comments: Ind; drives     Extremity/Trunk Assessment   Upper Extremity Assessment Upper Extremity Assessment: Defer to OT evaluation    Lower Extremity Assessment Lower Extremity Assessment: LLE deficits/detail LLE Deficits / Details: 2/5 knee extension in sitting, LLE wrapped in ace wrap    Cervical / Trunk Assessment Cervical / Trunk Assessment: Normal  Communication   Communication Communication: No apparent difficulties    Cognition Arousal: Alert Behavior During Therapy: WFL for tasks assessed/performed   PT - Cognitive impairments: No apparent impairments                         Following commands: Intact       Cueing Cueing Techniques: Verbal cues     General Comments General comments (skin integrity, edema, etc.): BP in LUE sitting after transfer to recliner: 147/81 mmHg MAP 98, standing after gait: 139/85 mmHg MAP 97    Exercises     Assessment/Plan    PT Assessment Patient needs continued PT services  PT Problem List Decreased strength;Pain;Decreased range of motion;Decreased activity tolerance;Decreased balance;Decreased mobility;Decreased safety awareness;Decreased knowledge of use of DME;Decreased knowledge of precautions       PT Treatment Interventions DME instruction;Balance training;Gait training;Neuromuscular re-education;Stair training;Functional mobility training;Therapeutic activities;Therapeutic exercise;Patient/family education    PT Goals (Current goals can be found in the Care Plan section)  Acute Rehab PT Goals Patient Stated Goal: get better PT Goal Formulation: With patient Time For Goal Achievement: 04/28/24 Potential to Achieve Goals: Good    Frequency Min 3X/week     Co-evaluation PT/OT/SLP Co-Evaluation/Treatment: Yes Reason for Co-Treatment: To address functional/ADL transfers;For patient/therapist safety PT goals  addressed during session: Mobility/safety with mobility;Balance;Proper use of DME         AM-PAC PT 6 Clicks Mobility  Outcome Measure Help needed turning from your back to your side while in a flat bed without using bedrails?: A Little Help needed moving from lying on your back to sitting on the side of a flat bed without using bedrails?: A Lot Help needed moving to and from a bed to a chair (including a wheelchair)?: A Little Help needed standing up from a chair using your arms (e.g., wheelchair or bedside chair)?: A Little Help needed to walk in hospital room?: A Little Help needed climbing 3-5 steps with a railing? : Total 6 Click Score: 15    End of Session   Activity Tolerance: Patient limited by pain;Patient tolerated treatment well Patient left: in chair;with call bell/phone within reach;with chair alarm set Nurse Communication: Mobility status PT Visit Diagnosis: Pain;Muscle weakness (generalized) (M62.81);Other abnormalities of gait and mobility (R26.89);Difficulty in walking, not elsewhere classified (R26.2) Pain - Right/Left: Left Pain - part of body: Leg    Time: 8886-8857 PT Time Calculation (min) (ACUTE ONLY): 29 min   Charges:   PT Evaluation $PT Eval Low Complexity: 1 Low   PT General Charges $$ ACUTE PT VISIT: 1 Visit         Richerd Pinal, PT, DPT 04/14/24, 12:14 PM   Richerd CHRISTELLA Pinal 04/14/2024, 12:13 PM

## 2024-04-14 NOTE — Progress Notes (Signed)
 Orthopaedic Trauma Progress Note  SUBJECTIVE: Doing fairly well this morning.  Notes pain in the leg is controlled with current medications.  States IV Dilaudid  makes her loopy.  Denies any numbness or tingling to the left lower extremity.  Has not been up out of bed yet since surgery but is eager to work with PT/OT today.  No chest pain. No SOB. No nausea/vomiting. No other complaints.  Tolerating diet and fluids.  No BM since admission. Prior to admission, patient was ambulating with no assistive device.  She lives at home, no steps to get inside.  Patient states she would be amenable to rehab facility if needed at discharge, she is very motivated to have a quick recovery  OBJECTIVE:  Vitals:   04/14/24 0610 04/14/24 0834  BP: 119/71 139/66  Pulse: 80 82  Resp: 18 17  Temp: 98.4 F (36.9 C) 98.6 F (37 C)  SpO2: 97% 96%    Opiates Today (MME): Today's  total administered Morphine  Milligram Equivalents: 7.5 Opiates Yesterday (MME): Yesterday's total administered Morphine  Milligram Equivalents: 147.5  General: Sitting up in bed, no acute distress.  Pleasant and cooperative Respiratory: No increased work of breathing.  Operative Extremity (LLE): Dressing clean, dry, intact.  Some soreness about the knee as expected but otherwise no significant tenderness throughout the thigh or lower leg.  Ankle DF/PF intact.  + EHL/FHL.  Endorses sensation all aspects of the foot.  Toes warm and well-perfused. + DP pulse  IMAGING: Stable post op imaging left femur.   LABS:  Results for orders placed or performed during the hospital encounter of 04/12/24 (from the past 24 hours)  Basic metabolic panel     Status: Abnormal   Collection Time: 04/14/24  2:32 AM  Result Value Ref Range   Sodium 137 135 - 145 mmol/L   Potassium 4.2 3.5 - 5.1 mmol/L   Chloride 104 98 - 111 mmol/L   CO2 24 22 - 32 mmol/L   Glucose, Bld 130 (H) 70 - 99 mg/dL   BUN 9 8 - 23 mg/dL   Creatinine, Ser 9.15 0.44 - 1.00 mg/dL    Calcium 8.4 (L) 8.9 - 10.3 mg/dL   GFR, Estimated >39 >39 mL/min   Anion gap 9 5 - 15  CBC     Status: Abnormal   Collection Time: 04/14/24  2:32 AM  Result Value Ref Range   WBC 7.8 4.0 - 10.5 K/uL   RBC 3.60 (L) 3.87 - 5.11 MIL/uL   Hemoglobin 11.2 (L) 12.0 - 15.0 g/dL   HCT 66.3 (L) 63.9 - 53.9 %   MCV 93.3 80.0 - 100.0 fL   MCH 31.1 26.0 - 34.0 pg   MCHC 33.3 30.0 - 36.0 g/dL   RDW 87.2 88.4 - 84.4 %   Platelets 212 150 - 400 K/uL   nRBC 0.0 0.0 - 0.2 %    ASSESSMENT: Kara Acevedo is a 68 y.o. female, 1 Day Post-Op s/p fall Procedures: OPEN REDUCTION INTERNAL FIXATION (ORIF) DISTAL FEMUR FRACTURE, LEFT  CV/Blood loss: Acute blood loss anemia, Hgb 11.2 this AM. Hemodynamically stable  PLAN: Weightbearing: WBAT LLE ROM:  Unrestricted ROM  Incisional and dressing care: Reinforce dressings as needed  Showering:  Ok to begin getting incisions wet starting 04/16/2024 Orthopedic device(s): None  Pain management:  1. Tylenol  1000 mg q 6 hours scheduled 2. Robaxin  500 mg q 6 hours PRN 3. Oxycodone  2.5-5 mg q 4 hours PRN 4. Dilaudid  0.5-1 mg q 4 hours PRN  VTE prophylaxis: Lovenox , SCDs ID:  Ancef  2gm post op Foley/Lines:  No foley, KVO IVFs Impediments to Fracture Healing: Vitamin D  level 28, started on supplementation Dispo: PT/OT evaluation today, dispo pending.  Patient may require SNF at discharge.  Plan to remove dressing from LLE tomorrow 04/15/2024.  Okay for discharge from ortho standpoint once cleared by medicine team and therapies  D/C recommendations: - Robaxin , oxycodone  2.5 mg, Tylenol  for pain control - Eliquis  2.5 mg twice daily x 30 days for DVT prophylaxis - Continue Vit D supplementation weekly x 8 weeks  Follow - up plan: 2 weeks after d/c for wound check and repeat x-rays   Contact information:  Franky Light MD, Lauraine Moores PA-C. After hours and holidays please check [email or web address] for group call information for [facility]   Lauraine PATRIC Moores, PA-C [phone] (office) [email or web address]

## 2024-04-14 NOTE — Progress Notes (Signed)
 Transition of Care [facility]) - CAGE-AID Screening   Patient Details  Name: Kara Acevedo MRN: [id number] Date of Birth: 1956/05/23  [facility] CM/SW Contact:    Bernardino Mayotte, RN Phone Number: 04/14/2024, 12:51 AM   Clinical Narrative:  Patient denies use of alcohol  and illicit drugs. Resources not given at this time.  CAGE-AID Screening:    Have You Ever Felt You Ought to Cut Down on Your Drinking or Drug Use?: No Have People Annoyed You By Critizing Your Drinking Or Drug Use?: No Have You Felt Bad Or Guilty About Your Drinking Or Drug Use?: No Have You Ever Had a Drink or Used Drugs First Thing In The Morning to Steady Your Nerves or to Get Rid of a Hangover?: No CAGE-AID Score: 0  Substance Abuse Education Offered: No

## 2024-04-14 NOTE — Progress Notes (Signed)
 [facility] Progress Note Patient: Kara Acevedo FMW:[id number] DOB: 1955-10-16  DOA: 04/12/2024 DOS: the patient was seen and examined on 04/14/2024  Brief Hospital Course: PMH of HTN, neuropathy, obesity, ADHD presents to the hospital with complaints of a mechanical fall. Was found to have periprosthetic fracture of left distal femur. Orthopedic consulted. Assessment and Plan: Left periprosthetic distal femur fracture. From mechanical fall Status post ORIF by Dr. Kendal on 9/3. WBAT left lower extremity. Lovenox  for DVT prophylaxis and discharge on oral DOAC's. PT OT consulted. Low vit d , started supplement   Recent rib fractures. F/u with pcp   Essential hypertension Bp low normal without home bp meds Holding home medication.   Hypokalemia Replaced   ADHD Adderall held since admission , resumed at discharge   Obesity Class 2 Body mass index is 35.78 kg/m.  Placing the pt at higher risk of poor outcomes. On Zepbound at home for weight management  Reports h/o weight loss surgery in 2014 Follow-up with PCP   Subjective: Pain well-controlled.  No nausea no vomiting.  Physical Exam: Clear to auscultation. S1-S2 present. Bowel sounds present No edema.  Data Reviewed: I have Reviewed nursing notes, Vitals, and Lab results. Since last encounter, pertinent lab results CBC and BMP   . I have ordered test including CBC and BMP  .   Disposition: Status is: Inpatient Remains inpatient appropriate because: Monitor for postop recovery  enoxaparin  (LOVENOX ) injection 40 mg Start: 04/14/24 0800 SCDs Start: 04/13/24 1457 SCDs Start: 04/12/24 2148   Family Communication: No one at bedside Level of care: Med-Surg   Vitals:   04/14/24 0022 04/14/24 0610 04/14/24 0834 04/14/24 1601  BP: (!) 104/59 119/71 139/66 124/80  Pulse: 80 80 82 81  Resp: 16 18 17 17   Temp: 98.2 F (36.8 C) 98.4 F (36.9 C) 98.6 F (37 C) 98.3 F (36.8 C)  TempSrc: Oral Oral   Oral  SpO2: 100% 97% 96% 98%  Weight:      Height:         Author: Yetta Blanch, MD 04/14/2024 6:45 PM  Please look on [email or web address] to find out who is on call.

## 2024-04-14 NOTE — Evaluation (Signed)
 Occupational Therapy Evaluation Patient Details Name: Kara Acevedo MRN: [id number] DOB: 10-13-1955 Today's Date: 04/14/2024   History of Present Illness   Pt is a 68 y/o F admitted on 04/12/24 after presenting with c/o a fall. Pt found to have periprosthetic fx of L distal femur. Pt is s/p ORIF on 04/13/24. PMH: HTN, neuropathy, obesity, ADHD     Clinical Impressions At baseline, pt is Ind with ADLs, IADLs, and functional mobility without an AD, and drives. Pt now presents with decreased activity tolerance, decreased balance, and decreased safety and independence with functional tasks. Pt currently demonstrates ability to complete ADLs largely with Set up to Mod assist, bed mobility with Min assist, and functional mobility/transfers with a RW Contact guard to Min assist. Pt reported blurriness of vision in sitting and standing this session but with VSS throughout. Pt participated well in session and is motivated to return to PLOF. Pt will benefit from acute skilled OT services to address deficits and increase safety and independence with functional tasks. Currently, OT recommends post acute intensive inpatient skilled rehab services < 3 hours per day to maximize rehab potential. However, depending on progress with skilled rehab during acute admission, pt may be able to progress to [facility] OT.      If plan is discharge home, recommend the following:   A little help with walking and/or transfers;A lot of help with bathing/dressing/bathroom;Assistance with cooking/housework;Assist for transportation;Help with stairs or ramp for entrance     Functional Status Assessment   Patient has had a recent decline in their functional status and demonstrates the ability to make significant improvements in function in a reasonable and predictable amount of time.     Equipment Recommendations   BSC/3in1;Other (comment) (RW)     Recommendations for Other Services          Precautions/Restrictions   Precautions Precautions: Fall Restrictions Weight Bearing Restrictions Per Provider Order: Yes LLE Weight Bearing Per Provider Order: Weight bearing as tolerated     Mobility Bed Mobility Overal bed mobility: Needs Assistance Bed Mobility: Supine to Sit     Supine to sit: Min assist, HOB elevated, Used rails (exit R side of bed, assistance to move LLE to EOB)          Transfers Overall transfer level: Needs assistance Equipment used: Rolling walker (2 wheels) Transfers: Sit to/from Stand, Bed to chair/wheelchair/BSC Sit to Stand: Contact guard assist     Step pivot transfers: Min assist (bed>Recliner on R with RW)     General transfer comment: sit>stand from EOB & recliner with cuing re: hand placement      Balance Overall balance assessment: Needs assistance Sitting-balance support: No upper extremity supported, Feet supported Sitting balance-Leahy Scale: Fair     Standing balance support: Single extremity supported, Bilateral upper extremity supported, During functional activity, Reliant on assistive device for balance Standing balance-Leahy Scale: Poor                             ADL either performed or assessed with clinical judgement   ADL Overall ADL's : Needs assistance/impaired Eating/Feeding: Set up;Sitting   Grooming: Set up;Sitting   Upper Body Bathing: Contact guard assist;Sitting   Lower Body Bathing: Minimal assistance;Moderate assistance;Sit to/from stand;Cueing for compensatory techniques   Upper Body Dressing : Contact guard assist;Sitting   Lower Body Dressing: Minimal assistance;Cueing for compensatory techniques;Sitting/lateral leans;Sit to/from stand   Toilet Transfer: Minimal assistance;Ambulation;Rolling walker (2 wheels);BSC/3in1 Toilet  Transfer Details (indicate cue type and reason): simulated bed to recliner Toileting- Clothing Manipulation and Hygiene: Minimal assistance;Sit to/from  stand       Functional mobility during ADLs: Contact guard assist;Minimal assistance;Rolling walker (2 wheels) General ADL Comments: Decreased activity tolerance, fatiguing quickly during tasks     Vision Baseline Vision/History: 1 Wears glasses Ability to See in Adequate Light: 0 Adequate (with glasses) Patient Visual Report: No change from baseline       Perception         Praxis         Pertinent Vitals/Pain Pain Assessment Pain Assessment: 0-10 Pain Score: 4  Pain Location: LLE (BLE loftstrand crutches & LLE prosthesis) Pain Descriptors / Indicators: Discomfort, Grimacing Pain Intervention(s): Limited activity within patient's tolerance, Monitored during session, Premedicated before session, Repositioned     Extremity/Trunk Assessment Upper Extremity Assessment Upper Extremity Assessment: Right hand dominant;Overall WFL for tasks assessed   Lower Extremity Assessment Lower Extremity Assessment: Defer to PT evaluation LLE Deficits / Details: 2/5 knee extension in sitting, LLE wrapped in ace wrap   Cervical / Trunk Assessment Cervical / Trunk Assessment: Normal   Communication Communication Communication: No apparent difficulties   Cognition Arousal: Alert Behavior During Therapy: WFL for tasks assessed/performed Cognition: No apparent impairments             OT - Cognition Comments: AAOx4 and pleasant throughout session.                 Following commands: Intact       Cueing  General Comments   Cueing Techniques: Verbal cues  Pt reporting mild blurriness in sitting and standing with BP in LUE sitting after transfer to recliner: 147/81 mmHg MAP 98, standing after gait: 139/85 mmHg MAP 97 and with O2 sat >/96% on RA and HR in the 70s to 80s throughout session.   Exercises     Shoulder Instructions      Home Living Family/patient expects to be discharged to:: Private residence Living Arrangements: Children;Other relatives (daughter and  son-in-law) Available Help at Discharge: Family;Available PRN/intermittently (daughter and SIL work outside the house) Type of Home: House Home Access: Level entry     Home Layout: Two level;Other (Comment) (pt lives in apartment in basement with level entry into basement) Alternate Level Stairs-Number of Steps: flight   Bathroom Shower/Tub: Producer, television/film/video: Handicapped height Bathroom Accessibility: Yes How Accessible: Accessible via walker Home Equipment: Shower seat   Additional Comments: has two dogs      Prior Functioning/Environment Prior Level of Function : Independent/Modified Independent;Driving;History of Falls (last six months)             Mobility Comments: Ind; one fall leading to this admission ADLs Comments: Ind; drives; enjoys working out; does some gardening    OT Problem List: Decreased activity tolerance;Impaired balance (sitting and/or standing)   OT Treatment/Interventions: Self-care/ADL training;Energy conservation;DME and/or AE instruction;Therapeutic activities;Patient/family education;Balance training      OT Goals(Current goals can be found in the care plan section)   Acute Rehab OT Goals Patient Stated Goal: to return home OT Goal Formulation: With patient Time For Goal Achievement: 04/28/24 Potential to Achieve Goals: Good ADL Goals Pt Will Perform Grooming: with modified independence;standing Pt Will Perform Lower Body Bathing: with supervision;sitting/lateral leans;sit to/from stand (with adaptive equipment as needed) Pt Will Perform Lower Body Dressing: with supervision;sitting/lateral leans;sit to/from stand (with adaptive equipment as needed) Pt Will Transfer to Toilet: with modified independence;ambulating;regular height toilet (  with least restrictive AD) Pt Will Perform Toileting - Clothing Manipulation and hygiene: with modified independence;sitting/lateral leans;sit to/from stand Additional ADL Goal #1: Patient  will demonstrate ability to Independently state 3 energy conservation strategies to increase safety and independence during functional tasks.   OT Frequency:  Min 2X/week    Co-evaluation PT/OT/SLP Co-Evaluation/Treatment: Yes Reason for Co-Treatment: To address functional/ADL transfers;For patient/therapist safety PT goals addressed during session: Mobility/safety with mobility;Balance;Proper use of DME OT goals addressed during session: ADL's and self-care      AM-PAC OT 6 Clicks Daily Activity     Outcome Measure Help from another person eating meals?: A Little Help from another person taking care of personal grooming?: A Little Help from another person toileting, which includes using toliet, bedpan, or urinal?: A Little Help from another person bathing (including washing, rinsing, drying)?: A Lot Help from another person to put on and taking off regular upper body clothing?: A Little Help from another person to put on and taking off regular lower body clothing?: A Little 6 Click Score: 17   End of Session Equipment Utilized During Treatment: Rolling walker (2 wheels);Gait belt Nurse Communication: Mobility status  Activity Tolerance: Patient tolerated treatment well Patient left: in chair;with call bell/phone within reach;with chair alarm set  OT Visit Diagnosis: Unsteadiness on feet (R26.81);Other abnormalities of gait and mobility (R26.89)                Time: 8886-8857 OT Time Calculation (min): 29 min Charges:  OT General Charges $OT Visit: 1 Visit OT Evaluation $OT Eval Low Complexity: 1 Low  Margarie Rockey HERO., OTR/L, MA Acute Rehab [phone]   Margarie FORBES Horns 04/14/2024, 1:47 PM

## 2024-04-15 DIAGNOSIS — M978XXA Periprosthetic fracture around other internal prosthetic joint, initial encounter: Secondary | ICD-10-CM | POA: Diagnosis present

## 2024-04-15 DIAGNOSIS — Z96649 Presence of unspecified artificial hip joint: Secondary | ICD-10-CM | POA: Diagnosis not present

## 2024-04-15 LAB — BASIC METABOLIC PANEL WITH GFR
Anion gap: 14 (ref 5–15)
BUN: 17 mg/dL (ref 8–23)
CO2: 23 mmol/L (ref 22–32)
Calcium: 8.3 mg/dL — ABNORMAL LOW (ref 8.9–10.3)
Chloride: 107 mmol/L (ref 98–111)
Creatinine, Ser: 0.81 mg/dL (ref 0.44–1.00)
GFR, Estimated: >60 mL/min (ref >60)
Glucose, Bld: 88 mg/dL (ref 70–99)
Potassium: 3.8 mmol/L (ref 3.5–5.1)
Sodium: 144 mmol/L (ref 135–145)

## 2024-04-15 LAB — CBC
HCT: 30.7 % — ABNORMAL LOW (ref 36.0–46.0)
Hemoglobin: 10.1 g/dL — ABNORMAL LOW (ref 12.0–15.0)
MCH: 30.8 pg (ref 26.0–34.0)
MCHC: 32.9 g/dL (ref 30.0–36.0)
MCV: 93.6 fL (ref 80.0–100.0)
Platelets: 185 K/uL (ref 150–400)
RBC: 3.28 MIL/uL — ABNORMAL LOW (ref 3.87–5.11)
RDW: 12.9 % (ref 11.5–15.5)
WBC: 7.6 K/uL (ref 4.0–10.5)
nRBC: 0 % (ref 0.0–0.2)

## 2024-04-15 LAB — MAGNESIUM: Magnesium: 1.7 mg/dL (ref 1.7–2.4)

## 2024-04-15 MED ORDER — APIXABAN 2.5 MG PO TABS: 2.5000 mg | ORAL_TABLET | Freq: Two times a day (BID) | ORAL | 0 refills | Status: DC

## 2024-04-15 MED ORDER — POLYETHYLENE GLYCOL 3350 17 G PO PACK
17.0000 g | PACK | Freq: Every day | ORAL | Status: DC
  Administered 2024-04-15 – 2024-04-18 (×3): 17 g via ORAL
  Filled 2024-04-15 (×3): qty 1

## 2024-04-15 MED ORDER — METHOCARBAMOL 500 MG PO TABS: 500.0000 mg | ORAL_TABLET | Freq: Four times a day (QID) | ORAL | 0 refills | Status: DC | PRN

## 2024-04-15 MED ORDER — OXYCODONE HCL 5 MG PO TABS: 2.5000 mg | ORAL_TABLET | ORAL | 0 refills | Status: DC | PRN

## 2024-04-15 MED ORDER — SENNOSIDES-DOCUSATE SODIUM 8.6-50 MG PO TABS
2.0000 | ORAL_TABLET | Freq: Two times a day (BID) | ORAL | Status: DC
  Administered 2024-04-15 – 2024-04-18 (×4): 2 via ORAL
  Filled 2024-04-15 (×5): qty 2

## 2024-04-15 MED ORDER — VITAMIN D (ERGOCALCIFEROL) 1.25 MG (50000 UNIT) PO CAPS: 50000.0000 [IU] | ORAL_CAPSULE | ORAL | 0 refills | Status: DC

## 2024-04-15 NOTE — Progress Notes (Signed)
 [facility] Progress Note Patient: Kara Acevedo FMW:[id number] DOB: 03/31/1956  DOA: 04/12/2024 DOS: the patient was seen and examined on 04/15/2024  Brief Hospital Course: PMH of HTN, neuropathy, obesity, ADHD presents to the hospital with complaints of a mechanical fall. Was found to have periprosthetic fracture of left distal femur. Orthopedic consulted. Assessment and Plan: Left periprosthetic distal femur fracture. From mechanical fall Status post ORIF by Dr. Kendal on 9/3. WBAT left lower extremity. Lovenox  for DVT prophylaxis and discharge on oral DOAC's. PT OT consulted. Low vit d , started supplement   Recent rib fractures. F/u with pcp   Essential hypertension Bp low normal without home bp meds Holding home medication.   Hypokalemia Replaced   ADHD Adderall held since admission , resumed at discharge   Obesity Class 2 Body mass index is 35.78 kg/m.  Placing the pt at higher risk of poor outcomes. On Zepbound at home for weight management  Reports h/o weight loss surgery in 2014 Follow-up with PCP  Constipation  Modified bowel regimen   Acute postop blood loss anemia. Baseline hemoglobin around 12.  Currently hemoglobin 10. Monitor.   Subjective: Pain well-controlled.  No nausea no vomiting no fever no chills.  Passing gas.  Wants to go to SNF.  Physical Exam: Clear to auscultation. S1-S2 present. Bowel sound present. Trace edema bilaterally.  Data Reviewed: I have Reviewed nursing notes, Vitals, and Lab results. Since last encounter, pertinent lab results CBC and BMP   . I have ordered test including CBC and BMP  .   Disposition: Status is: Inpatient Remains inpatient appropriate because: Awaiting stability of hemoglobin  enoxaparin  (LOVENOX ) injection 40 mg Start: 04/14/24 0800 SCDs Start: 04/13/24 1457 SCDs Start: 04/12/24 2148   Family Communication: No one at bedside Level of care: Med-Surg   Vitals:   04/14/24 1601  04/14/24 2023 04/15/24 0641 04/15/24 0912  BP: 124/80 133/72 126/65 107/64  Pulse: 81 84 79 78  Resp: 17  18 18   Temp: 98.3 F (36.8 C) 98.7 F (37.1 C) 98.4 F (36.9 C) 98.4 F (36.9 C)  TempSrc: Oral Oral Oral Oral  SpO2: 98% 96% 100% 99%  Weight:      Height:         Author: Yetta Blanch, MD 04/15/2024 4:49 PM  Please look on [email or web address] to find out who is on call.

## 2024-04-15 NOTE — Progress Notes (Signed)
   Inpatient Rehabilitation Admissions Coordinator   Received a call from patient to inquire into CIR admit. I reviewed chart and explained to patient that she did not qualify/need CIR level rehab at this time. I alerted TOC.  Heron Leavell, RN, MSN Rehab Admissions Coordinator [phone] 04/15/2024 2:04 PM

## 2024-04-15 NOTE — Care Management Important Message (Signed)
 Important Message  Patient Details  Name: Kara Acevedo MRN: [id number] Date of Birth: 06/17/1956   Important Message Given:  Yes - Medicare IM     Jon Cruel 04/15/2024, 4:01 PM

## 2024-04-15 NOTE — NC FL2 (Signed)
 [facility]  MEDICAID FL2 LEVEL OF CARE FORM     IDENTIFICATION  Patient Name: Kara Acevedo Birthdate: 01-10-1956 Sex: female Admission Date (Current Location): 04/12/2024  [facility] and [place] Number:  Producer, television/film/video and Address:  [facility], [address], [place], [place] [address]      Provider Number: [id number]  Attending Physician Name and Address:  Tobie Yetta HERO, MD  Relative Name and Phone Number:       Current Level of Care: Hospital Recommended Level of Care: Skilled Nursing Facility Prior Approval Number:    Date Approved/Denied:   PASRR Number: [id number]  Discharge Plan: SNF    Current Diagnoses: Patient Active Problem List   Diagnosis Date Noted   Femur fracture, left (HCC) 04/12/2024   Hip fracture (HCC) 02/23/2022   Hypokalemia 02/23/2022   ADHD 02/23/2022   Essential hypertension 02/23/2022   Rib pain 02/23/2022   Morbid obesity (HCC) 02/23/2022    Orientation RESPIRATION BLADDER Height & Weight     Self, Time, Situation, Place  Normal Continent Weight: 215 lb (97.5 kg) Height:  5' 5 (165.1 cm)  BEHAVIORAL SYMPTOMS/MOOD NEUROLOGICAL BOWEL NUTRITION STATUS      Continent Diet (See DC Summary)  AMBULATORY STATUS COMMUNICATION OF NEEDS Skin   Limited Assist Verbally Other (Comment) (closed surgical incision on left leg)                       Personal Care Assistance Level of Assistance  Bathing, Feeding, Dressing Bathing Assistance: Limited assistance Feeding assistance: Limited assistance Dressing Assistance: Limited assistance     Functional Limitations Info  Sight, Speech, Hearing Sight Info: Adequate Hearing Info: Adequate Speech Info: Adequate    SPECIAL CARE FACTORS FREQUENCY  PT (By licensed PT), OT (By licensed OT)     PT Frequency: 5x/week OT Frequency: 5x/week            Contractures Contractures Info: Not present    Additional Factors Info  Allergies, Code  Status Code Status Info: Full Allergies Info: Doxycycline; Latex; Sulfa Antibiotics; Fluconazole; Nickel; Ace Inhibitors           Current Medications (04/15/2024):  This is the current hospital active medication list Current Facility-Administered Medications  Medication Dose Route Frequency Provider Last Rate Last Admin   acetaminophen  (TYLENOL ) tablet 1,000 mg  1,000 mg Oral Q6H McClung, Sarah A, PA-C   1,000 mg at 04/15/24 1215   diphenhydrAMINE  (BENADRYL ) 12.5 MG/5ML elixir 12.5-25 mg  12.5-25 mg Oral Q4H PRN Danton Lauraine LABOR, PA-C       docusate sodium  (COLACE) capsule 100 mg  100 mg Oral BID Danton Lauraine A, PA-C   100 mg at 04/15/24 9147   enoxaparin  (LOVENOX ) injection 40 mg  40 mg Subcutaneous Q24H Danton Lauraine LABOR, PA-C   40 mg at 04/15/24 9147   HYDROmorphone  (DILAUDID ) injection 0.5-1 mg  0.5-1 mg Intravenous Q4H PRN Danton Lauraine LABOR, PA-C   1 mg at 04/13/24 1525   methocarbamol  (ROBAXIN ) tablet 500 mg  500 mg Oral Q6H PRN Danton Lauraine LABOR, PA-C   500 mg at 04/15/24 0630   Or   methocarbamol  (ROBAXIN ) injection 500 mg  500 mg Intravenous Q6H PRN Danton Lauraine A, PA-C       multivitamin with minerals tablet 1 tablet  1 tablet Oral Daily Danton Lauraine LABOR, PA-C   1 tablet at 04/15/24 9147   nutrition supplement (JUVEN) (JUVEN) powder packet 1 packet  1  packet Oral BID BM Danton Lauraine LABOR, PA-C   1 packet at 04/15/24 9147   ondansetron  (ZOFRAN ) injection 4 mg  4 mg Intravenous Q6H PRN Danton Lauraine LABOR, PA-C   4 mg at 04/13/24 0933   oxyCODONE  (Oxy IR/ROXICODONE ) immediate release tablet 2.5-5 mg  2.5-5 mg Oral Q4H PRN Danton Lauraine LABOR, PA-C   5 mg at 04/15/24 0630   polyethylene glycol (MIRALAX  / GLYCOLAX ) packet 17 g  17 g Oral Daily PRN Danton Lauraine LABOR, PA-C       protein supplement (ENSURE MAX) liquid  11 oz Oral BID Danton Lauraine LABOR, PA-C   11 oz at 04/15/24 1215   Vitamin D  (Ergocalciferol ) (DRISDOL ) 1.25 MG (50000 UNIT) capsule 50,000 Units  50,000 Units Oral Q7 days Danton Lauraine LABOR, PA-C         Discharge Medications: Please see discharge summary for a list of discharge medications.  Relevant Imaging Results:  Relevant Lab Results:   Additional Information SSN: [id number]  Jeoffrey LITTIE Moose, LCSWA

## 2024-04-15 NOTE — Plan of Care (Signed)

## 2024-04-15 NOTE — Progress Notes (Signed)
 Orthopaedic Trauma Progress Note  SUBJECTIVE: Doing okay this morning, currently resting comfortably in bed.  Pain noted to be 8/10 a couple of hours ago, improved with oxycodone .  Denies any numbness or tingling to the left lower extremity.  Was able make some progress with therapies yesterday.  Is very motivated to get stronger.  No chest pain. No SOB. No nausea/vomiting. No other complaints.  Tolerating diet and fluids.  No BM since admission.    OBJECTIVE:  Vitals:   04/14/24 2023 04/15/24 0641  BP: 133/72 126/65  Pulse: 84 79  Resp:  18  Temp: 98.7 F (37.1 C) 98.4 F (36.9 C)  SpO2: 96% 100%    Opiates Today (MME): Today's  total administered Morphine  Milligram Equivalents: 7.5 Opiates Yesterday (MME): Yesterday's total administered Morphine  Milligram Equivalents: 30  General: Resting comfortably in bed.  NAD Respiratory: No increased work of breathing.  Operative Extremity (LLE): Ace wrap's removed, Mepilex dressings are clean, dry, intact.  These were left in place.  Some soreness about the knee as expected but otherwise no significant tenderness throughout the thigh or lower leg.  Ankle DF/PF intact.  + EHL/FHL.  Endorses sensation all aspects of the foot.  Toes warm and well-perfused. + DP pulse  IMAGING: Stable post op imaging left femur.   LABS:  Results for orders placed or performed during the hospital encounter of 04/12/24 (from the past 24 hours)  CBC     Status: Abnormal   Collection Time: 04/15/24  2:26 AM  Result Value Ref Range   WBC 7.6 4.0 - 10.5 K/uL   RBC 3.28 (L) 3.87 - 5.11 MIL/uL   Hemoglobin 10.1 (L) 12.0 - 15.0 g/dL   HCT 69.2 (L) 63.9 - 53.9 %   MCV 93.6 80.0 - 100.0 fL   MCH 30.8 26.0 - 34.0 pg   MCHC 32.9 30.0 - 36.0 g/dL   RDW 87.0 88.4 - 84.4 %   Platelets 185 150 - 400 K/uL   nRBC 0.0 0.0 - 0.2 %  Basic metabolic panel with GFR     Status: Abnormal   Collection Time: 04/15/24  2:26 AM  Result Value Ref Range   Sodium 144 135 - 145 mmol/L    Potassium 3.8 3.5 - 5.1 mmol/L   Chloride 107 98 - 111 mmol/L   CO2 23 22 - 32 mmol/L   Glucose, Bld 88 70 - 99 mg/dL   BUN 17 8 - 23 mg/dL   Creatinine, Ser 9.18 0.44 - 1.00 mg/dL   Calcium 8.3 (L) 8.9 - 10.3 mg/dL   GFR, Estimated >39 >39 mL/min   Anion gap 14 5 - 15  Magnesium      Status: None   Collection Time: 04/15/24  2:26 AM  Result Value Ref Range   Magnesium  1.7 1.7 - 2.4 mg/dL    ASSESSMENT: Kara Acevedo is a 68 y.o. female, 2 Days Post-Op s/p fall Procedures: OPEN REDUCTION INTERNAL FIXATION (ORIF) DISTAL FEMUR FRACTURE, LEFT  CV/Blood loss: Acute blood loss anemia, Hgb 11.2 this AM. Hemodynamically stable  PLAN: Weightbearing: WBAT LLE ROM:  Unrestricted ROM  Incisional and dressing care: Dressings removed today.  Change as needed Showering:  Ok to begin getting incisions wet starting 04/16/2024 Orthopedic device(s): None  Pain management:  1. Tylenol  1000 mg q 6 hours scheduled 2. Robaxin  500 mg q 6 hours PRN 3. Oxycodone  2.5-5 mg q 4 hours PRN 4. Dilaudid  0.5-1 mg q 4 hours PRN VTE prophylaxis: Lovenox , SCDs ID:  Ancef  2gm post op completed Foley/Lines:  No foley, KVO IVFs Impediments to Fracture Healing: Vitamin D  level 28, started on supplementation Dispo: PT/OT evaluation ongoing, current recommendation is SNF versus HH.  Patient would prefer discharge home with [facility].  Okay for discharge from ortho standpoint once cleared by medicine team and therapies.  I have sent discharge Rx for pain medication, muscle relaxer, DVT prophylaxis, vitamin D  to patient's pharmacy on file  D/C recommendations: - Robaxin , oxycodone  5 mg, Tylenol  for pain control - Eliquis  2.5 mg twice daily x 30 days for DVT prophylaxis - Continue Vit D supplementation weekly x 8 weeks  Follow - up plan: 2 weeks after d/c for wound check and repeat x-rays   Contact information:  Franky Light MD, Lauraine Moores PA-C. After hours and holidays please check [email or web address] for group  call information for [facility]   Lauraine PATRIC Moores, PA-C [phone] (office) [email or web address]

## 2024-04-15 NOTE — TOC Initial Note (Signed)
 Transition of Care [facility]) - Initial/Assessment Note    Patient Details  Name: Kara Acevedo MRN: [id number] Date of Birth: 05/07/1956  Transition of Care [facility]) CM/SW Contact:    Jeoffrey LITTIE Moose, [place] Phone Number: 04/15/2024, 12:05 PM  Clinical Narrative:                 Pt admitted from home with daughter due to fall. CSW completed SNF workup with pt. Pt is agreeable to DC plan and asked CSW to send referral to [facility]. CSW completed Fl2 and sent out SNF referrals, will follow up with bed offers.   Expected Discharge Plan: Skilled Nursing Facility Barriers to Discharge: Continued Medical Work up, English as a second language teacher, SNF Pending bed offer   Patient Goals and CMS Choice Patient states their goals for this hospitalization and ongoing recovery are:: SNF          Expected Discharge Plan and Services       Living arrangements for the past 2 months: Single Family Home                                      Prior Living Arrangements/Services Living arrangements for the past 2 months: Single Family Home Lives with:: Adult Children Patient language and need for interpreter reviewed:: Yes Do you feel safe going back to the place where you live?: Yes      Need for Family Participation in Patient Care: No (Comment) Care giver support system in place?: Yes (comment)   Criminal Activity/Legal Involvement Pertinent to Current Situation/Hospitalization: No - Comment as needed  Activities of Daily Living   ADL Screening (condition at time of admission) Independently performs ADLs?: Yes (appropriate for developmental age)  Permission Sought/Granted Permission sought to share information with : Facility Medical sales representative, Family Supports    Share Information with NAME: Nat  Permission granted to share info w AGENCY: SNFs  Permission granted to share info w Relationship: Daughter  Permission granted to share info w Contact Information:  [phone]  Emotional Assessment Appearance:: Appears stated age Attitude/Demeanor/Rapport: Engaged Affect (typically observed): Accepting Orientation: : Oriented to Self, Oriented to Place, Oriented to  Time, Oriented to Situation Alcohol  / Substance Use: Not Applicable Psych Involvement: No (comment)  Admission diagnosis:  Femur fracture, left (HCC) [S72.92XA] Peri-prosthetic femoral shaft fracture [[id number], Z96.649] Patient Active Problem List   Diagnosis Date Noted   Femur fracture, left (HCC) 04/12/2024   Hip fracture (HCC) 02/23/2022   Hypokalemia 02/23/2022   ADHD 02/23/2022   Essential hypertension 02/23/2022   Rib pain 02/23/2022   Morbid obesity (HCC) 02/23/2022   PCP:  Renato Dorothey HERO, NP Pharmacy:   [facility] [id number] [place], [place] - [address] AT [facility] [address] [facility] [address] [place] [address] Phone: [phone] Fax: [phone]  [place] [facility] [address] [place] [address] Phone: [phone] Fax: [phone]  [id number] - [place], [place] - [address] AT CORNER OF [facility] [address] [place] [address] Phone: [phone] Fax: [phone]     Social Drivers of Health (SDOH) Social History: SDOH Screenings   Food Insecurity: No Food Insecurity (04/12/2024)  Housing: Unknown (04/12/2024)  Transportation Needs: No Transportation Needs (04/12/2024)  Utilities: Not At Risk (04/12/2024)  Financial Resource Strain: Low Risk  (09/27/2021)   Received from [facility]  Social Connections:  Socially Integrated (04/14/2024)  Stress: No Stress Concern Present (09/27/2021)   Received from [facility]  Tobacco Use: Low Risk  (04/13/2024)  Recent Concern: Tobacco Use - Medium Risk (03/07/2024)   Received from [facility]   SDOH Interventions: Social Connections Interventions: Intervention Not Indicated   Readmission Risk Interventions      No data to display

## 2024-04-15 NOTE — Progress Notes (Signed)
 Mobility Specialist Progress Note:    04/15/24 1007  Mobility  Activity Ambulated with assistance;Pivoted/transferred from bed to chair  Level of Assistance Contact guard assist, steadying assist  Assistive Device Front wheel walker  Distance Ambulated (ft) 25 ft  LLE Weight Bearing Per Provider Order WBAT  Activity Response Tolerated well  Mobility Referral Yes  Mobility visit 1 Mobility  Mobility Specialist Start Time (ACUTE ONLY) 0941  Mobility Specialist Stop Time (ACUTE ONLY) 1002  Mobility Specialist Time Calculation (min) (ACUTE ONLY) 21 min   Received pt in bed and agreeable to mobility. Pt requested to ambulate to BR. No physical assistance needed. C/o LLE pain, otherwise tolerated well. Pt left in chair with alarm on. Personal belongings and call light within reach. All needs met.  Lavanda Pollack Mobility Specialist  Please contact via [employer] or  Rehab Office [phone]

## 2024-04-16 ENCOUNTER — Inpatient Hospital Stay (HOSPITAL_COMMUNITY)

## 2024-04-16 DIAGNOSIS — Z96649 Presence of unspecified artificial hip joint: Secondary | ICD-10-CM | POA: Diagnosis not present

## 2024-04-16 DIAGNOSIS — M978XXA Periprosthetic fracture around other internal prosthetic joint, initial encounter: Secondary | ICD-10-CM | POA: Diagnosis not present

## 2024-04-16 LAB — CBC
HCT: 31.8 % — ABNORMAL LOW (ref 36.0–46.0)
Hemoglobin: 10.6 g/dL — ABNORMAL LOW (ref 12.0–15.0)
MCH: 31.2 pg (ref 26.0–34.0)
MCHC: 33.3 g/dL (ref 30.0–36.0)
MCV: 93.5 fL (ref 80.0–100.0)
Platelets: 205 K/uL (ref 150–400)
RBC: 3.4 MIL/uL — ABNORMAL LOW (ref 3.87–5.11)
RDW: 13.1 % (ref 11.5–15.5)
WBC: 6.6 K/uL (ref 4.0–10.5)
nRBC: 0 % (ref 0.0–0.2)

## 2024-04-16 LAB — BASIC METABOLIC PANEL WITH GFR
Anion gap: 10 (ref 5–15)
BUN: 15 mg/dL (ref 8–23)
CO2: 27 mmol/L (ref 22–32)
Calcium: 8.5 mg/dL — ABNORMAL LOW (ref 8.9–10.3)
Chloride: 103 mmol/L (ref 98–111)
Creatinine, Ser: 0.7 mg/dL (ref 0.44–1.00)
GFR, Estimated: >60 mL/min (ref >60)
Glucose, Bld: 87 mg/dL (ref 70–99)
Potassium: 3.8 mmol/L (ref 3.5–5.1)
Sodium: 140 mmol/L (ref 135–145)

## 2024-04-16 LAB — MAGNESIUM: Magnesium: 1.8 mg/dL (ref 1.7–2.4)

## 2024-04-16 MED ORDER — ORAL CARE MOUTH RINSE
15.0000 mL | OROMUCOSAL | Status: DC | PRN
Start: 2024-04-16 — End: 2024-04-18

## 2024-04-16 MED ORDER — PNEUMOCOCCAL 20-VAL CONJ VACC 0.5 ML IM SUSY
0.5000 mL | PREFILLED_SYRINGE | INTRAMUSCULAR | Status: DC
  Filled 2024-04-16: qty 0.5

## 2024-04-16 MED ORDER — INFLUENZA VAC SPLIT HIGH-DOSE 0.5 ML IM SUSY: 0.5000 mL | PREFILLED_SYRINGE | INTRAMUSCULAR | Status: DC | PRN

## 2024-04-16 MED ORDER — SORBITOL 70 % SOLN
60.0000 mL | Freq: Once | Status: AC
  Administered 2024-04-16: 60 mL via ORAL
  Filled 2024-04-16 (×2): qty 60

## 2024-04-16 MED ORDER — HYDROCHLOROTHIAZIDE 12.5 MG PO TABS
12.5000 mg | ORAL_TABLET | Freq: Every day | ORAL | Status: DC
  Administered 2024-04-16 – 2024-04-18 (×3): 12.5 mg via ORAL
  Filled 2024-04-16 (×3): qty 1

## 2024-04-16 NOTE — Progress Notes (Signed)
 Mobility Specialist Progress Note:    04/16/24 1220  Mobility  Activity Ambulated with assistance  Level of Assistance Contact guard assist, steadying assist  Assistive Device Front wheel walker  Distance Ambulated (ft) 50 ft  LLE Weight Bearing Per Provider Order WBAT  Activity Response Tolerated fair  Mobility Referral Yes  Mobility visit 1 Mobility  Mobility Specialist Start Time (ACUTE ONLY) 1220  Mobility Specialist Stop Time (ACUTE ONLY) 1240  Mobility Specialist Time Calculation (min) (ACUTE ONLY) 20 min   Pt pleasant and agreeable to session. First ambulated to restroom, then out into the hallway towards the front desk. Pt c/o of pain and towards the end of the session, nausea. Once sitting down and breathing. Pt started feeling better. Returned pt to recliner w/ all needs met. RN in room   Venetia Keel Mobility Specialist Please Neurosurgeon or Rehab Office at [phone]

## 2024-04-16 NOTE — Plan of Care (Signed)

## 2024-04-16 NOTE — Plan of Care (Signed)
 Pt up in hallway ambulating with the mobility tech and sat in the chair 2 hours today, up to bathroom-tolerated well.

## 2024-04-16 NOTE — TOC Progression Note (Signed)
 Transition of Care [facility]) - Progression Note    Patient Details  Name: Kara Acevedo MRN: [id number] Date of Birth: 1955/12/31  Transition [facility]) CM/SW Contact  Isaiah Public, LCSWA Phone Number: 04/16/2024, 4:20 PM  Clinical Narrative:     CSW spoke with patient and provided SNF bed offers. Patient is going to review and give CSW a call back with SNF choice.   Expected Discharge Plan: Skilled Nursing Facility Barriers to Discharge: Continued Medical Work up, English as a second language teacher, SNF Pending bed offer               Expected Discharge Plan and Services       Living arrangements for the past 2 months: Single Family Home                                       Social Drivers of Health (SDOH) Interventions SDOH Screenings   Food Insecurity: No Food Insecurity (04/12/2024)  Housing: Low Risk  (04/16/2024)  Transportation Needs: No Transportation Needs (04/12/2024)  Utilities: Not At Risk (04/12/2024)  Financial Resource Strain: Low Risk  (09/27/2021)   Received from [facility]  Social Connections: Socially Integrated (04/14/2024)  Stress: No Stress Concern Present (09/27/2021)   Received from [facility]  Tobacco Use: Low Risk  (04/13/2024)  Recent Concern: Tobacco Use - Medium Risk (03/07/2024)   Received from [facility]    Readmission Risk Interventions     No data to display

## 2024-04-16 NOTE — Progress Notes (Signed)
 [facility] Progress Note Patient: Kara Acevedo FMW:[id number] DOB: 08-21-1955  DOA: 04/12/2024 DOS: the patient was seen and examined on 04/16/2024  Brief Hospital Course: PMH of HTN, neuropathy, obesity, ADHD presents to the hospital with complaints of a mechanical fall. Was found to have periprosthetic fracture of left distal femur. Orthopedic consulted. Assessment and Plan: Left periprosthetic distal femur fracture. From mechanical fall Status post ORIF by Dr. Kendal on 9/3. WBAT left lower extremity. Lovenox  for DVT prophylaxis and discharge on oral DOAC's. PT OT consulted. Low vit d , started supplement   Recent rib fractures. Seen in the ED on 7/18.  Outpatient follow-up.  Pain controlled.   Essential hypertension BP normal. Holding home Norvasc  and indapamide .  Initiating HCTZ as a milder version of diuretic   Hypokalemia Replaced   ADHD Adderall held since admission , resumed at discharge   Obesity Class 2 Body mass index is 35.78 kg/m.  Placing the pt at higher risk of poor outcomes. On Zepbound at home for weight management  Reports h/o weight loss surgery in 2014 Follow-up with PCP  Constipation  Modified bowel regimen   Acute postop blood loss anemia. Baseline hemoglobin around 12.  Currently hemoglobin 10.  Stable. Monitor   Subjective: No nausea no vomiting no fever no chills.  Still constipated  Physical Exam: Bilateral lower extremity edema. S1-S2 present Bowel sounds present  Data Reviewed: I have Reviewed nursing notes, Vitals, and Lab results. Since last encounter, pertinent lab results CBC and BMP   . I have ordered test including BMP  .   Disposition: Status is: Inpatient Remains inpatient appropriate because: Awaiting placement  enoxaparin  (LOVENOX ) injection 40 mg Start: 04/14/24 0800 SCDs Start: 04/13/24 1457 SCDs Start: 04/12/24 2148   Family Communication: No one at bedside Level of care: Med-Surg   Vitals:    04/15/24 1824 04/15/24 2036 04/16/24 0418 04/16/24 0911  BP: 122/64 124/73 130/74 122/85  Pulse: 84 77 72 76  Resp: 18 17 17 18   Temp: 98.3 F (36.8 C) 98.9 F (37.2 C) 98.4 F (36.9 C) 98 F (36.7 C)  TempSrc: Oral Oral Oral Oral  SpO2: 100% 97% 99% 100%  Weight:      Height:         Author: Yetta Blanch, MD 04/16/2024 5:26 PM  Please look on [email or web address] to find out who is on call.

## 2024-04-17 DIAGNOSIS — M978XXA Periprosthetic fracture around other internal prosthetic joint, initial encounter: Secondary | ICD-10-CM | POA: Diagnosis not present

## 2024-04-17 DIAGNOSIS — Z96649 Presence of unspecified artificial hip joint: Secondary | ICD-10-CM | POA: Diagnosis not present

## 2024-04-17 NOTE — Plan of Care (Signed)
  Problem: Education: Goal: Knowledge of General Education information will improve Description: Including pain rating scale, medication(s)/side effects and non-pharmacologic comfort measures 04/17/2024 0527 by Wyvonne Dines, RN Outcome: Progressing 04/17/2024 0527 by Wyvonne Dines, RN Outcome: Progressing   Problem: Health Behavior/Discharge Planning: Goal: Ability to manage health-related needs will improve 04/17/2024 0527 by Wyvonne Dines, RN Outcome: Progressing 04/17/2024 0527 by Wyvonne Dines, RN Outcome: Progressing   Problem: Clinical Measurements: Goal: Ability to maintain clinical measurements within normal limits will improve 04/17/2024 0527 by Wyvonne Dines, RN Outcome: Progressing 04/17/2024 0527 by Wyvonne Dines, RN Outcome: Progressing Goal: Will remain free from infection 04/17/2024 0527 by Wyvonne Dines, RN Outcome: Progressing 04/17/2024 0527 by Wyvonne Dines, RN Outcome: Progressing Goal: Diagnostic test results will improve 04/17/2024 0527 by Wyvonne Dines, RN Outcome: Progressing 04/17/2024 0527 by Wyvonne Dines, RN Outcome: Progressing Goal: Respiratory complications will improve 04/17/2024 0527 by Wyvonne Dines, RN Outcome: Progressing 04/17/2024 0527 by Wyvonne Dines, RN Outcome: Progressing Goal: Cardiovascular complication will be avoided 04/17/2024 0527 by Wyvonne Dines, RN Outcome: Progressing 04/17/2024 0527 by Wyvonne Dines, RN Outcome: Progressing   Problem: Activity: Goal: Risk for activity intolerance will decrease 04/17/2024 0527 by Wyvonne Dines, RN Outcome: Progressing 04/17/2024 0527 by Wyvonne Dines, RN Outcome: Progressing   Problem: Nutrition: Goal: Adequate nutrition will be maintained 04/17/2024 0527 by Wyvonne Dines, RN Outcome: Progressing 04/17/2024 0527 by Wyvonne Dines, RN Outcome: Progressing   Problem: Coping: Goal: Level of anxiety will decrease 04/17/2024 0527 by Wyvonne Dines, RN Outcome: Progressing 04/17/2024 0527 by Wyvonne Dines,  RN Outcome: Progressing   Problem: Elimination: Goal: Will not experience complications related to bowel motility 04/17/2024 0527 by Wyvonne Dines, RN Outcome: Progressing 04/17/2024 0527 by Wyvonne Dines, RN Outcome: Progressing Goal: Will not experience complications related to urinary retention 04/17/2024 0527 by Wyvonne Dines, RN Outcome: Progressing 04/17/2024 0527 by Wyvonne Dines, RN Outcome: Progressing   Problem: Pain Managment: Goal: General experience of comfort will improve and/or be controlled 04/17/2024 0527 by Wyvonne Dines, RN Outcome: Progressing 04/17/2024 0527 by Wyvonne Dines, RN Outcome: Progressing   Problem: Safety: Goal: Ability to remain free from injury will improve 04/17/2024 0527 by Wyvonne Dines, RN Outcome: Progressing 04/17/2024 0527 by Wyvonne Dines, RN Outcome: Progressing   Problem: Skin Integrity: Goal: Risk for impaired skin integrity will decrease 04/17/2024 0527 by Wyvonne Dines, RN Outcome: Progressing 04/17/2024 0527 by Wyvonne Dines, RN Outcome: Progressing

## 2024-04-17 NOTE — Progress Notes (Signed)
 Physical Therapy Treatment Patient Details Name: Kara Acevedo MRN: [id number] DOB: 04-Dec-1955 Today's Date: 04/17/2024   History of Present Illness Pt is a 68 y/o F admitted on 04/12/24 after presenting with c/o a fall. Pt found to have periprosthetic fx of L distal femur. Pt is s/p ORIF on 04/13/24. PMH: HTN, neuropathy, obesity, ADHD    PT Comments  Pt agreeable to session with focus on progression of activity tolerance, standing balance, and gait training. Pt able to demo good standing balance with single UE support and reaching outside BOS to complete self-care, and tolerated >5 min standing without seated rest. Pt then able to also improve ambulation distance with BUE support on RW. Continues to ambulate with step-to gait pattern and decreased wt on LLE and inability to maintain L foot flat with stance. Will continue to benefit from skilled Pt acutely and post-acute therapies to improve gait mechanics and decrease dependence on UE support for balance.    If plan is discharge home, recommend the following: A little help with bathing/dressing/bathroom;A little help with walking and/or transfers;Assistance with cooking/housework;Assist for transportation;Help with stairs or ramp for entrance   Can travel by private vehicle     Yes  Equipment Recommendations  Rolling walker (2 wheels);BSC/3in1    Recommendations for Other Services       Precautions / Restrictions Precautions Precautions: Fall Recall of Precautions/Restrictions: Intact Restrictions Weight Bearing Restrictions Per Provider Order: Yes LLE Weight Bearing Per Provider Order: Weight bearing as tolerated     Mobility  Bed Mobility Overal bed mobility: Needs Assistance Bed Mobility: Supine to Sit     Supine to sit: HOB elevated, Used rails, Supervision     General bed mobility comments: no assist given, slightly increased time and use of bed features    Transfers Overall transfer level: Needs  assistance Equipment used: Rolling walker (2 wheels) Transfers: Sit to/from Stand, Bed to chair/wheelchair/BSC Sit to Stand: Contact guard assist, Min assist, From elevated surface   Step pivot transfers: Supervision       General transfer comment: minA to steady walker while pt pulling to stand, poor power in BLE to rise, CGA in bathroom from elevated surface and with BUE support on bars    Ambulation/Gait Ambulation/Gait assistance: Contact guard assist Gait Distance (Feet): 45 Feet Assistive device: Rolling walker (2 wheels) Gait Pattern/deviations: Decreased stance time - left, Decreased weight shift to left, Decreased stride length, Decreased step length - right, Decreased step length - left, Step-to pattern Gait velocity: decreased Gait velocity interpretation: <1.31 ft/sec, indicative of household ambulator   General Gait Details: step-to with RLE not passing LLE, cues to maintain LLE foot flat, but pt unable due to pain   Stairs             Wheelchair Mobility     Tilt Bed    Modified Rankin (Stroke Patients Only)       Balance Overall balance assessment: Needs assistance Sitting-balance support: No upper extremity supported, Feet supported Sitting balance-Leahy Scale: Fair     Standing balance support: Single extremity supported, Bilateral upper extremity supported, During functional activity, Reliant on assistive device for balance Standing balance-Leahy Scale: Fair Standing balance comment: able to reach outside BOS and maintain with single UE support                            Communication Communication Communication: No apparent difficulties  Cognition Arousal: Alert Behavior During Therapy: [facility]  for tasks assessed/performed   PT - Cognitive impairments: No apparent impairments                       PT - Cognition Comments: able to direct care and follow all instructions Following commands: Intact      Cueing Cueing  Techniques: Verbal cues  Exercises      General Comments General comments (skin integrity, edema, etc.): VSS      Pertinent Vitals/Pain Pain Assessment Pain Assessment: Faces Faces Pain Scale: Hurts little more Pain Location: LLE (BLE loftstrand crutches & LLE prosthesis) Pain Descriptors / Indicators: Discomfort, Grimacing Pain Intervention(s): Limited activity within patient's tolerance, Monitored during session, Premedicated before session, Repositioned     PT Goals (current goals can now be found in the care plan section) Acute Rehab PT Goals Patient Stated Goal: get better PT Goal Formulation: With patient Time For Goal Achievement: 04/28/24 Potential to Achieve Goals: Good Progress towards PT goals: Progressing toward goals    Frequency    Min 3X/week       AM-PAC PT 6 Clicks Mobility   Outcome Measure  Help needed turning from your back to your side while in a flat bed without using bedrails?: A Little Help needed moving from lying on your back to sitting on the side of a flat bed without using bedrails?: A Lot Help needed moving to and from a bed to a chair (including a wheelchair)?: A Little Help needed standing up from a chair using your arms (e.g., wheelchair or bedside chair)?: A Little Help needed to walk in hospital room?: A Little Help needed climbing 3-5 steps with a railing? : Total 6 Click Score: 15    End of Session Equipment Utilized During Treatment: Gait belt Activity Tolerance: Patient limited by pain;Patient tolerated treatment well Patient left: in chair;with call bell/phone within reach;with chair alarm set Nurse Communication: Mobility status PT Visit Diagnosis: Pain;Muscle weakness (generalized) (M62.81);Other abnormalities of gait and mobility (R26.89);Difficulty in walking, not elsewhere classified (R26.2) Pain - Right/Left: Left Pain - part of body: Leg     Time: 8971-8941 PT Time Calculation (min) (ACUTE ONLY): 30  min  Charges:    $Gait Training: 8-22 mins $Therapeutic Activity: 8-22 mins PT General Charges $$ ACUTE PT VISIT: 1 Visit                     Izetta Call, PT, DPT   Acute Rehabilitation Department Office [phone] Secure Chat Communication Preferred    Izetta JULIANNA Call 04/17/2024, 11:36 AM

## 2024-04-17 NOTE — Progress Notes (Signed)
 Mobility Specialist Progress Note:    04/17/24 1640  Mobility  Activity Ambulated with assistance (Bathroom)  Level of Assistance Contact guard assist, steadying assist  Assistive Device Front wheel walker  Distance Ambulated (ft) 25 ft  LLE Weight Bearing Per Provider Order WBAT  Activity Response Tolerated well  Mobility Referral Yes  Mobility visit 1 Mobility  Mobility Specialist Start Time (ACUTE ONLY) 1640  Mobility Specialist Stop Time (ACUTE ONLY) 1654  Mobility Specialist Time Calculation (min) (ACUTE ONLY) 14 min   Received pt laying in bed agreeable to session needing to go to restroom. No c/o any symptoms. Pt able to move and ambulate with light min and RW. Returned pt to bed w/ all needs met.   Venetia Keel Mobility Specialist Please Neurosurgeon or Rehab Office at [phone]

## 2024-04-17 NOTE — Plan of Care (Signed)
 Pt doing well with therapy and has had large BM today.

## 2024-04-17 NOTE — Progress Notes (Signed)
 [facility] Progress Note Patient: Kara Acevedo FMW:[id number] DOB: April 16, 1956  DOA: 04/12/2024 DOS: the patient was seen and examined on 04/17/2024  Brief Hospital Course: PMH of HTN, neuropathy, obesity, ADHD presents to the hospital with complaints of a mechanical fall. Was found to have periprosthetic fracture of left distal femur. Orthopedic consulted.  Assessment and Plan: Left periprosthetic distal femur fracture. From mechanical fall Status post ORIF by Dr. Kendal on 9/3. WBAT left lower extremity. Lovenox  for DVT prophylaxis and discharge on oral DOAC's. PT OT consulted. Low vit d , started supplement   Recent rib fractures. Seen in the ED on 7/18.  Outpatient follow-up.  Pain controlled.   Essential hypertension BP normal. Holding home Norvasc  and indapamide .  Initiating HCTZ as a milder version of diuretic Check BMP.   Hypokalemia Replaced.  Will recheck tomorrow after initiation of HCTZ.   ADHD Adderall held since admission , resumed at discharge   Obesity Class 2 Body mass index is 35.78 kg/m.  Placing the pt at higher risk of poor outcomes. On Zepbound at home for weight management  Reports h/o weight loss surgery in 2014 Follow-up with PCP  Constipation  Modified bowel regimen   Acute postop blood loss anemia. Baseline hemoglobin around 12.  Currently hemoglobin 10.  Stable. Monitor   Subjective: No nausea no vomiting no fever no chills.  Feeling better.  Physical Exam: Clear to auscultation for S1-S2 present Improving edema  Data Reviewed: I have Reviewed nursing notes, Vitals, and Lab results. I have ordered test including BMP  .   Disposition: Status is: Inpatient Remains inpatient appropriate because: Awaiting placement  enoxaparin  (LOVENOX ) injection 40 mg Start: 04/14/24 0800 SCDs Start: 04/13/24 1457 SCDs Start: 04/12/24 2148  Family Communication: Family at bedside Level of care: Med-Surg   Vitals:   04/16/24  2024 04/17/24 0615 04/17/24 0758 04/17/24 1433  BP: 117/72 101/62 113/69 (!) 157/76  Pulse: 71 69 66 79  Resp: 18 18 19 18   Temp: 98.2 F (36.8 C) 98.2 F (36.8 C) 98.2 F (36.8 C) 98.2 F (36.8 C)  TempSrc: Oral Oral Oral Oral  SpO2: 99% 97% 97% 100%  Weight:      Height:         Author: Yetta Blanch, MD 04/17/2024 6:48 PM  Please look on [email or web address] to find out who is on call.

## 2024-04-18 DIAGNOSIS — M978XXA Periprosthetic fracture around other internal prosthetic joint, initial encounter: Secondary | ICD-10-CM | POA: Diagnosis not present

## 2024-04-18 DIAGNOSIS — Z96649 Presence of unspecified artificial hip joint: Secondary | ICD-10-CM | POA: Diagnosis not present

## 2024-04-18 LAB — BASIC METABOLIC PANEL WITH GFR
Anion gap: 10 (ref 5–15)
BUN: 17 mg/dL (ref 8–23)
CO2: 27 mmol/L (ref 22–32)
Calcium: 8.4 mg/dL — ABNORMAL LOW (ref 8.9–10.3)
Chloride: 102 mmol/L (ref 98–111)
Creatinine, Ser: 0.72 mg/dL (ref 0.44–1.00)
GFR, Estimated: >60 mL/min (ref >60)
Glucose, Bld: 84 mg/dL (ref 70–99)
Potassium: 3.4 mmol/L — ABNORMAL LOW (ref 3.5–5.1)
Sodium: 139 mmol/L (ref 135–145)

## 2024-04-18 LAB — MAGNESIUM: Magnesium: 1.7 mg/dL (ref 1.7–2.4)

## 2024-04-18 MED ORDER — ADULT MULTIVITAMIN W/MINERALS CH: 1.0000 | ORAL_TABLET | Freq: Every day | ORAL | 0 refills | Status: AC

## 2024-04-18 MED ORDER — METHOCARBAMOL 500 MG PO TABS: 500.0000 mg | ORAL_TABLET | Freq: Four times a day (QID) | ORAL | 0 refills | Status: AC | PRN

## 2024-04-18 MED ORDER — ACETAMINOPHEN 325 MG PO TABS: 650.0000 mg | ORAL_TABLET | Freq: Four times a day (QID) | ORAL | Status: DC | PRN

## 2024-04-18 MED ORDER — POLYETHYLENE GLYCOL 3350 17 G PO PACK: 17.0000 g | PACK | Freq: Every day | ORAL | 0 refills | Status: DC

## 2024-04-18 MED ORDER — DOCUSATE SODIUM 100 MG PO CAPS: 100.0000 mg | ORAL_CAPSULE | Freq: Two times a day (BID) | ORAL | 0 refills | Status: AC

## 2024-04-18 MED ORDER — HYDROCHLOROTHIAZIDE 12.5 MG PO TABS: 12.5000 mg | ORAL_TABLET | Freq: Every day | ORAL | 0 refills | Status: DC

## 2024-04-18 MED ORDER — APIXABAN 2.5 MG PO TABS: 2.5000 mg | ORAL_TABLET | Freq: Two times a day (BID) | ORAL | 0 refills | Status: AC

## 2024-04-18 MED ORDER — ENSURE MAX PROTEIN PO LIQD: 11.0000 [oz_av] | Freq: Two times a day (BID) | ORAL | 0 refills | Status: AC

## 2024-04-18 MED ORDER — VITAMIN D (ERGOCALCIFEROL) 1.25 MG (50000 UNIT) PO CAPS: 50000.0000 [IU] | ORAL_CAPSULE | ORAL | 0 refills | Status: AC

## 2024-04-18 MED ORDER — OXYCODONE HCL 5 MG PO TABS
2.5000 mg | ORAL_TABLET | ORAL | 0 refills | Status: DC | PRN
Start: 2024-04-18 — End: 2024-05-10

## 2024-04-18 MED ORDER — POTASSIUM CHLORIDE CRYS ER 20 MEQ PO TBCR
40.0000 meq | EXTENDED_RELEASE_TABLET | Freq: Once | ORAL | Status: AC
  Administered 2024-04-18: 40 meq via ORAL
  Filled 2024-04-18: qty 2

## 2024-04-18 MED ORDER — IBUPROFEN 400 MG PO TABS: 200.0000 mg | ORAL_TABLET | Freq: Four times a day (QID) | ORAL | Status: DC | PRN

## 2024-04-18 NOTE — TOC Progression Note (Addendum)
 Transition of Care [facility]) - Progression Note    Patient Details  Name: Kara Acevedo MRN: [id number] Date of Birth: 11-Nov-1955  Transition of Care [facility]) CM/SW Contact  Stephane Powell Jansky, RN Phone Number: 04/18/2024, 3:50 PM  Clinical Narrative:     Discussed disposition with patient. Patient declining SNF for short term rehab. Patient wants to go home with home health. Choice offered. Patient has had [facility] in past and requesting [facility] again. Cory with [facility] accepted referral for HHPT/OT. Secure chatted MD for orders.   PT recommending walker and 3 in 1 . NCM requested orders and ordered with Jermaine with [employer]. Patient aware insurance only covers DME once every 5 years.  Patient voiced understanding   Patient lives with two daughters. NCM asked if daughters are aware change in plan . Patient states she will call them   Expected Discharge Plan: Home w Home Health Services Barriers to Discharge: Continued Medical Work up               Expected Discharge Plan and Services In-house Referral: NA Discharge Planning Services: CM Consult Post Acute Care Choice: Home Health, Durable Medical Equipment Living arrangements for the past 2 months: Single Family Home Expected Discharge Date: 04/18/24               DME Arranged: BOB Finder rolling DME Agency: [employer] Date DME Agency Contacted: 04/18/24 Time DME Agency Contacted: [phone] Representative spoke with at DME Agency: [place] HH Arranged: PT, OT HH Agency: [facility] Date [facility] Agency Contacted: 04/18/24 Time HH Agency Contacted: [phone] Representative spoke with at [facility] Agency: [place]   Social Drivers of Health (SDOH) Interventions SDOH Screenings   Food Insecurity: No Food Insecurity (04/12/2024)  Housing: Low Risk  (04/16/2024)  Transportation Needs: No Transportation Needs (04/12/2024)  Utilities: Not At Risk (04/12/2024)  Financial Resource Strain: Low Risk  (09/27/2021)   [facility]  Social Connections: Socially Integrated (04/14/2024)  Stress: No Stress Concern Present (09/27/2021)   Received from [facility]  Tobacco Use: Low Risk  (04/13/2024)  Recent Concern: Tobacco Use - Medium Risk (03/07/2024)   Received from [facility]    Readmission Risk Interventions     No data to display

## 2024-04-18 NOTE — Progress Notes (Signed)
 PT Cancellation Note  Patient Details Name: Kara Acevedo MRN: [id number] DOB: 1955/11/25   Cancelled Treatment:    Reason Eval/Treat Not Completed: Other (comment); patient relates up with OT earlier this morning and up walking hallways on her own earlier this pm.  Declines PT at this time due to fatigue.  States she plans to d/c home as feels she can manage and can wake her son if she needs help.  Discussed follow up and has had HHPT in the past.  States she needs a walker for home and reviewed fall risk reduction techniques for home.  Will follow up another day.   Montie Portal 04/18/2024, 3:14 PM Micheline Portal, PT Acute Rehabilitation Services [phone] 04/18/2024

## 2024-04-18 NOTE — Progress Notes (Signed)
 Occupational Therapy Treatment Patient Details Name: Kara Acevedo MRN: [id number] DOB: 1955/11/06 Today's Date: 04/18/2024   History of present illness Pt is a 68 y/o F admitted on 04/12/24 after presenting with c/o a fall. Pt found to have periprosthetic fx of L distal femur. Pt is s/p ORIF on 04/13/24. PMH: HTN, neuropathy, obesity, ADHD   OT comments  Pt progressing toward established OT goals. Challenging return to BADL, activity tolerance, and functional mobility this session with LB ADL, toileting, standing grooming task and functional mobility into hall. Pt needing set-up assist for donning socks, CGA for functional mobility with RW, and CGA with at least one UE support for standing grooming tasks. Pt continues to present with decreased functional independence, reliance on AD, decreased strength, balance, and activity tolerance. Patient will benefit from continued inpatient follow up therapy, <3 hours/day.        If plan is discharge home, recommend the following:  A little help with walking and/or transfers;A lot of help with bathing/dressing/bathroom;Assistance with cooking/housework;Assist for transportation;Help with stairs or ramp for entrance   Equipment Recommendations  BSC/3in1;Other (comment) (RW)    Recommendations for Other Services      Precautions / Restrictions Precautions Precautions: Fall Recall of Precautions/Restrictions: Intact Restrictions Weight Bearing Restrictions Per Provider Order: Yes LLE Weight Bearing Per Provider Order: Weight bearing as tolerated       Mobility Bed Mobility Overal bed mobility: Needs Assistance Bed Mobility: Supine to Sit     Supine to sit: Supervision          Transfers Overall transfer level: Needs assistance Equipment used: Rolling walker (2 wheels) Transfers: Sit to/from Stand, Bed to chair/wheelchair/BSC Sit to Stand: Contact guard assist, Supervision                 Balance Overall balance  assessment: Needs assistance Sitting-balance support: No upper extremity supported, Feet supported Sitting balance-Leahy Scale: Fair     Standing balance support: Single extremity supported, Bilateral upper extremity supported, During functional activity, Reliant on assistive device for balance Standing balance-Leahy Scale: Fair Standing balance comment: able to reach outside BOS and maintain with single UE support                           ADL either performed or assessed with clinical judgement   ADL Overall ADL's : Needs assistance/impaired     Grooming: Contact guard assist;Standing Grooming Details (indicate cue type and reason): pt needing UE support during ADL with at least one UE on sink throughout             Lower Body Dressing: Set up;Bed level;Sitting/lateral leans Lower Body Dressing Details (indicate cue type and reason): modified long sit EOB with LLE in bed with her the fully in bed with modified long sit/lying to don R sock Toilet Transfer: Contact guard assist;Ambulation;Rolling walker (2 wheels)   Toileting- Clothing Manipulation and Hygiene: Contact guard assist;Sit to/from stand       Functional mobility during ADLs: Contact guard assist;Rolling walker (2 wheels)      Extremity/Trunk Assessment Upper Extremity Assessment Upper Extremity Assessment: Right hand dominant;Overall [facility] for tasks assessed   Lower Extremity Assessment Lower Extremity Assessment: Defer to PT evaluation        Vision       Perception     Praxis     Communication Communication Communication: No apparent difficulties   Cognition Arousal: Alert Behavior During Therapy: [facility] for tasks assessed/performed  Cognition: No apparent impairments                               Following commands: Intact        Cueing   Cueing Techniques: Verbal cues  Exercises      Shoulder Instructions       General Comments VSS    Pertinent Vitals/  Pain       Pain Assessment Pain Assessment: Faces Faces Pain Scale: Hurts even more Pain Location: LLE Pain Descriptors / Indicators: Discomfort, Grimacing Pain Intervention(s): Limited activity within patient's tolerance, Monitored during session  Home Living                                          Prior Functioning/Environment              Frequency  Min 2X/week        Progress Toward Goals  OT Goals(current goals can now be found in the care plan section)  Progress towards OT goals: Progressing toward goals  Acute Rehab OT Goals Patient Stated Goal: get better OT Goal Formulation: With patient Time For Goal Achievement: 04/28/24 Potential to Achieve Goals: Good ADL Goals Pt Will Perform Grooming: with modified independence;standing Pt Will Perform Lower Body Bathing: with supervision;sitting/lateral leans;sit to/from stand (with AE as needed) Pt Will Perform Lower Body Dressing: with supervision;sitting/lateral leans;sit to/from stand (with AE as needed) Pt Will Transfer to Toilet: with modified independence;ambulating;regular height toilet (with LRAD) Pt Will Perform Toileting - Clothing Manipulation and hygiene: with modified independence;sitting/lateral leans;sit to/from stand Additional ADL Goal #1: Patient will demonstrate ability to Independently state 3 energy conservation strategies to increase safety and independence during functional tasks.  Plan      Co-evaluation                 AM-PAC OT 6 Clicks Daily Activity     Outcome Measure   Help from another person eating meals?: A Little Help from another person taking care of personal grooming?: A Little Help from another person toileting, which includes using toliet, bedpan, or urinal?: A Little Help from another person bathing (including washing, rinsing, drying)?: A Little Help from another person to put on and taking off regular upper body clothing?: A Little Help from  another person to put on and taking off regular lower body clothing?: A Little 6 Click Score: 18    End of Session Equipment Utilized During Treatment: Rolling walker (2 wheels)  OT Visit Diagnosis: Unsteadiness on feet (R26.81);Other abnormalities of gait and mobility (R26.89)   Activity Tolerance Patient tolerated treatment well   Patient Left in chair;with call bell/phone within reach   Nurse Communication Mobility status        Time: 0927-0950 OT Time Calculation (min): 23 min  Charges: OT General Charges $OT Visit: 1 Visit OT Treatments $Self Care/Home Management : 23-37 mins  Elma JONETTA Lebron FREDERICK, OTR/L [facility] Acute Rehabilitation Office: [phone]   Elma JONETTA Lebron 04/18/2024, 9:59 AM

## 2024-04-21 NOTE — Discharge Summary (Signed)
 Physician Discharge Summary   Patient: Kara Acevedo MRN: [id number] DOB: July 13, 1956  Admit date:     04/12/2024  Discharge date: 04/18/2024  Discharge Physician: Yetta Blanch  PCP: Renato Dorothey HERO, NP  Recommendations at discharge: Follow-up with orthopedic as recommended. Follow-up with PCP in 1 to 2-week with BMP and CBC.   Follow-up Information     Haddix, Franky SQUIBB, MD. Schedule an appointment as soon as possible for a visit in 2 week(s).   Specialty: Orthopedic Surgery Why: for wound check and repeat x-rays Contact information: [address] [place] [address] [phone]         [facility], [facility] Follow up.   Specialty: Home Health Services Contact information: [address] [place] [address] [phone]         Renato Dorothey HERO, NP. Schedule an appointment as soon as possible for a visit in 1 week(s).   Specialty: Internal Medicine Why: with BMP lab to look at kidney/electrolyte numbers, To discuss blood pressure meds Contact information: [id number] [place] [address] [phone]                Hospital Course: PMH of HTN, neuropathy, obesity, ADHD presents to the hospital with complaints of a mechanical fall. Was found to have periprosthetic fracture of left distal femur. Orthopedic consulted. Underwent surgery.  Initially PT OT recommended SNF although patient progressed well while awaiting for placement and wanted to go home with home health.  Assessment and Plan: Left periprosthetic distal femur fracture. From mechanical fall Status post ORIF by Dr. Kendal on 9/3. WBAT left lower extremity. Lovenox  for DVT prophylaxis and discharge on oral DOAC's. PT OT consulted.  Now recommending home with home health. Low vit d , started supplement   Recent rib fractures. Seen in the ED on 7/18.  Outpatient follow-up.  Pain controlled.   Essential hypertension BP normal. Holding home Norvasc  and  indapamide .  Initiating HCTZ as a milder version of diuretic Continue potassium.   Hypokalemia Replaced.  Will recheck tomorrow after initiation of HCTZ.   ADHD Adderall held since admission , resumed at discharge   Obesity Class 2 Body mass index is 35.78 kg/m.  Placing the pt at higher risk of poor outcomes. On Zepbound at home for weight management  Reports h/o weight loss surgery in 2014 Follow-up with PCP  Constipation  Modified bowel regimen   Acute postop blood loss anemia. Baseline hemoglobin around 12.  Currently hemoglobin 10.  Stable. Monitor  Consultants:  Orthopedic surgery  Procedures performed:  ORIF left supracondylar distal femur fracture.  DISCHARGE MEDICATION: Allergies as of 04/18/2024       Reactions   Ace Inhibitors Other (See Comments), Cough   Uvula swelling   Doxycycline Nausea And Vomiting   Latex Other (See Comments)   Blisters   Sulfa Antibiotics Rash   Fluconazole Other (See Comments)   Feels like a burning sensation from inside out   Nickel Swelling, Dermatitis   Blistering        Medication List     PAUSE taking these medications    amLODipine  5 MG tablet Wait to take this until your doctor or other care provider tells you to start again. Commonly known as: NORVASC  Take 1 tablet (5 mg total) by mouth daily. Please hold this medication if your systolic blood pressure ( top number) is less than 100. What changed: additional instructions   indapamide  2.5 MG tablet Wait to  take this until your doctor or other care provider tells you to start again. Commonly known as: LOZOL  Take 1 tablet (2.5 mg total) by mouth daily as needed. edema What changed:  when to take this additional instructions       STOP taking these medications    morphine  15 MG tablet Commonly known as: MSIR   traMADol 50 MG tablet Commonly known as: ULTRAM       TAKE these medications    amphetamine-dextroamphetamine 20 MG tablet Commonly known  as: ADDERALL Take 20 mg by mouth daily.   apixaban  2.5 MG Tabs tablet Commonly known as: Eliquis  Take 1 tablet (2.5 mg total) by mouth 2 (two) times daily.   cyanocobalamin  1000 MCG/ML injection Commonly known as: VITAMIN B12 1 ml Injection once monthly; Duration: 90 days   docusate sodium  100 MG capsule Commonly known as: Colace Take 1 capsule (100 mg total) by mouth 2 (two) times daily.   Ensure Max Protein Liqd Take 330 mLs (11 oz total) by mouth 2 (two) times daily.   hydrochlorothiazide  12.5 MG tablet Commonly known as: HYDRODIURIL  Take 1 tablet (12.5 mg total) by mouth daily.   hydrocortisone 2.5 % cream Apply topically.   ibuprofen  800 MG tablet Commonly known as: ADVIL  Take 800 mg by mouth daily as needed for moderate pain (pain score 4-6) or mild pain (pain score 1-3).   methocarbamol  500 MG tablet Commonly known as: ROBAXIN  Take 1 tablet (500 mg total) by mouth every 6 (six) hours as needed for muscle spasms.   multivitamin with minerals Tabs tablet Take 1 tablet by mouth daily.   ondansetron  4 MG tablet Commonly known as: ZOFRAN  Take 4 mg by mouth daily as needed for nausea or vomiting.   oxyCODONE  5 MG immediate release tablet Commonly known as: Oxy IR/ROXICODONE  Take 0.5-1 tablets (2.5-5 mg total) by mouth every 4 (four) hours as needed for severe pain (pain score 7-10).   polyethylene glycol 17 g packet Commonly known as: MIRALAX  / GLYCOLAX  Take 17 g by mouth daily.   potassium chloride  SA 20 MEQ tablet Commonly known as: KLOR-CON  M Take 20 mEq by mouth 3 (three) times daily.   Vitamin D  (Ergocalciferol ) 1.25 MG (50000 UNIT) Caps capsule Commonly known as: DRISDOL  Take 1 capsule (50,000 Units total) by mouth every 7 (seven) days.   Zepbound 2.5 MG/0.5ML Pen Generic drug: tirzepatide Inject 2.5 mg into the skin once a week. Wednesday               Discharge Care Instructions  (From admission, onward)           Start     Ordered    04/18/24 0000  Leave dressing on - Keep it clean, dry, and intact until clinic visit        04/18/24 1545           Disposition: Home Diet recommendation: Cardiac diet  Discharge Exam: Vitals:   04/17/24 2055 04/18/24 0345 04/18/24 0846 04/18/24 1735  BP: (!) 108/58 113/61 113/65 (!) 142/77  Pulse: 72 73 77 71  Resp: 18 16 18 18   Temp: 98.7 F (37.1 C) 98.6 F (37 C) 98 F (36.7 C) 98.3 F (36.8 C)  TempSrc: Oral Oral Oral Oral  SpO2: 100% 97% 98% 100%  Weight:      Height:       Clear to auscultation. S1-S2 present Bowel sound present. Improving edema of lower extremity.  Filed Weights   04/12/24 1100 04/13/24 1025  Weight: 97.5 kg 97.5 kg   Condition at discharge: stable  The results of significant diagnostics from this hospitalization (including imaging, microbiology, ancillary and laboratory) are listed below for reference.   Imaging Studies: DG Abd Portable 1V Result Date: 04/16/2024 CLINICAL DATA:  Constipation. EXAM: PORTABLE ABDOMEN - 1 VIEW COMPARISON:  None Available. FINDINGS: No abnormal bowel dilatation is noted. Moderate amount of stool seen throughout the colon. No abnormal calcifications. IMPRESSION: Moderate stool burden. No abnormal bowel dilatation. Electronically Signed   By: Lynwood Landy Raddle M.D.   On: 04/16/2024 12:48   DG FEMUR PORT MIN 2 VIEWS LEFT Result Date: 04/13/2024 CLINICAL DATA:  Fracture, postop. EXAM: LEFT FEMUR PORTABLE 2 VIEWS COMPARISON:  Preoperative imaging FINDINGS: Lateral plate and screw fixation of distal femur fracture. Improved fracture alignment from preoperative imaging. Previous knee arthroplasty and proximal femoral hardware. Recent postsurgical change includes air and edema in the soft tissues. IMPRESSION: ORIF of distal femur fracture, in improved alignment from preoperative imaging. Electronically Signed   By: Andrea Gasman M.D.   On: 04/13/2024 15:03   DG FEMUR MIN 2 VIEWS LEFT Result Date: 04/13/2024 CLINICAL  DATA:  Elective surgery. EXAM: LEFT FEMUR 2 VIEWS COMPARISON:  Preoperative imaging FINDINGS: Seven fluoroscopic spot views of the femur submitted from the operating room. Plate and screw fixation of distal femur fracture. Previous knee arthroplasty and proximal femoral hardware. Fluoroscopy time 56 seconds. Dose 6.61 mGy. IMPRESSION: Intraoperative fluoroscopy during ORIF of distal femur fracture. Electronically Signed   By: Andrea Gasman M.D.   On: 04/13/2024 13:39   DG C-Arm 1-60 Min-No Report Result Date: 04/13/2024 Fluoroscopy was utilized by the requesting physician.  No radiographic interpretation.   DG CHEST PORT 1 VIEW Result Date: 04/12/2024 CLINICAL DATA:  Preop chest exam.  Femur fracture. EXAM: PORTABLE CHEST 1 VIEW COMPARISON:  Radiograph 02/26/2024 FINDINGS: Stable heart size and mediastinal contours. Retrocardiac hiatal hernia. No focal airspace disease, pleural effusion, pulmonary edema or pneumothorax. No acute osseous abnormalities are seen. IMPRESSION: No active disease. Electronically Signed   By: Andrea Gasman M.D.   On: 04/12/2024 19:54   CT Angio Aortobifemoral W and/or Wo Contrast Result Date: 04/12/2024 CLINICAL DATA:  Leg trauma, knee fracture.  Diminished pulses. EXAM: CT ANGIOGRAPHY OF ABDOMINAL AORTA WITH ILIOFEMORAL RUNOFF TECHNIQUE: Multidetector CT imaging of the abdomen, pelvis and lower extremities was performed using the standard protocol during bolus administration of intravenous contrast. Multiplanar CT image reconstructions and MIPs were obtained to evaluate the vascular anatomy. RADIATION DOSE REDUCTION: This exam was performed according to the departmental dose-optimization program which includes automated exposure control, adjustment of the mA and/or kV according to patient size and/or use of iterative reconstruction technique. CONTRAST:  OMNIPAQUE  IOHEXOL  350 MG/ML SOLN COMPARISON:  Left knee x-ray same day. FINDINGS: VASCULAR Aorta: Normal caliber aorta  without aneurysm, dissection, vasculitis or significant stenosis. There is calcified atherosclerotic disease throughout the aorta. Celiac: Patent without evidence of aneurysm, dissection, vasculitis or significant stenosis. SMA: Patent without evidence of aneurysm, dissection, vasculitis or significant stenosis. Renals: Both renal arteries are patent without evidence of aneurysm, dissection, vasculitis, fibromuscular dysplasia or significant stenosis. IMA: Patent without evidence of aneurysm, dissection, vasculitis or significant stenosis. RIGHT Lower Extremity Inflow: Common, internal and external iliac arteries are patent without evidence of aneurysm, dissection, vasculitis or significant stenosis. Outflow: Common, superficial and profunda femoral arteries and the popliteal artery are patent without evidence of aneurysm, dissection, vasculitis or significant stenosis. Limited evaluation of popliteal artery secondary to streak  artifact from the knee. Runoff: Patent three vessel runoff to the ankle. LEFT Lower Extremity Inflow: Common, internal and external iliac arteries are patent without evidence of aneurysm, dissection, vasculitis or significant stenosis. Outflow: Common, superficial and profunda femoral arteries are patent without evidence of aneurysm, dissection, vasculitis or significant stenosis. There is limited evaluation of a portion of the popliteal artery secondary to streak artifact from the knee. Runoff: Patent three vessel runoff to the ankle. Veins: No obvious venous abnormality within the limitations of this arterial phase study. Review of the MIP images confirms the above findings. NON-VASCULAR Lower chest: No acute abnormality. Hepatobiliary: Gallstones are likely present. No biliary ductal dilatation. Liver is within normal limits. Pancreas: Unremarkable. No pancreatic ductal dilatation or surrounding inflammatory changes. Spleen: Normal in size without focal abnormality. Adrenals/Urinary Tract:  Adrenal glands are unremarkable. Kidneys are normal, without renal calculi, focal lesion, or hydronephrosis. Bladder is unremarkable. Stomach/Bowel: There surgical changes of the proximal stomach. Small hiatal hernia is present. Appendix appears normal. No evidence of bowel wall thickening, distention, or inflammatory changes. There is sigmoid colon diverticulosis. Lymphatic: No enlarged lymph nodes are seen. Reproductive: Uterus and bilateral adnexa are unremarkable. Other: No abdominal wall hernia or abnormality. No abdominopelvic ascites. Musculoskeletal: Degenerative changes affect the left hip and spine. Left-sided hip screw and intramedullary nail are present. Bilateral knee arthroplasties are present. There is an oblique acute comminuted fracture of the distal femur at the level of the prosthesis. There is 1 cm of posterolateral displacement of the distal fracture fragment. IMPRESSION: 1. No evidence for arterial injury. Note is made that there is limited evaluation of the level of the popliteal artery secondary to streak artifact from knee arthroplasty. 2. Acute comminuted fracture of the distal left femur at the level of the knee prosthesis. 3.  No acute localizing process in the abdomen or pelvis. 4. Cholelithiasis. 5. Sigmoid colon diverticulosis. Aortic Atherosclerosis (ICD10-I70.0). Electronically Signed   By: Greig Pique M.D.   On: 04/12/2024 17:48   DG Hip Unilat W or Wo Pelvis 2-3 Views Left Result Date: 04/12/2024 CLINICAL DATA:  Left knee pain after fall. EXAM: DG HIP (WITH OR WITHOUT PELVIS) 2-3V LEFT COMPARISON:  February 24, 2022. FINDINGS: Status post surgical internal fixation proximal left femoral intrauterine fracture. No acute fracture or dislocation is noted. IMPRESSION: No acute abnormality seen. Electronically Signed   By: Lynwood Landy Raddle M.D.   On: 04/12/2024 12:10   DG Femur 1 View Left Result Date: 04/12/2024 CLINICAL DATA:  Left knee pain after fall. EXAM: LEFT FEMUR 1 VIEW  COMPARISON:  February 24, 2022. FINDINGS: Status post surgical internal fixation of proximal left femoral fracture. Status post left total knee arthroplasty. Severely displaced and possibly comminuted periprosthetic fracture of distal left femur is noted. IMPRESSION: Severely displaced and possibly comminuted distal left femoral periprosthetic fracture. Electronically Signed   By: Lynwood Landy Raddle M.D.   On: 04/12/2024 12:08   DG Knee 1-2 Views Left Result Date: 04/12/2024 CLINICAL DATA:  Left knee pain after fall. EXAM: LEFT KNEE - 1-2 VIEW COMPARISON:  February 23, 2022. FINDINGS: Status post left total knee arthroplasty. Severely displaced and possibly comminuted periprosthetic fracture is seen involving the distal left femur. IMPRESSION: Severely displaced and possibly comminuted periprosthetic fracture involving distal left femur. Electronically Signed   By: Lynwood Landy Raddle M.D.   On: 04/12/2024 12:05    Microbiology: Results for orders placed or performed during the hospital encounter of 02/22/22  SARS Coronavirus 2 by RT  PCR (hospital order, performed in [facility] hospital lab) *cepheid single result test* Anterior Nasal Swab     Status: None   Collection Time: 02/23/22  1:11 AM   Specimen: Anterior Nasal Swab  Result Value Ref Range Status   SARS Coronavirus 2 by RT PCR NEGATIVE NEGATIVE Final    Comment: (NOTE) SARS-CoV-2 target nucleic acids are NOT DETECTED.  The SARS-CoV-2 RNA is generally detectable in upper and lower respiratory specimens during the acute phase of infection. The lowest concentration of SARS-CoV-2 viral copies this assay can detect is 250 copies / mL. A negative result does not preclude SARS-CoV-2 infection and should not be used as the sole basis for treatment or other patient management decisions.  A negative result may occur with improper specimen collection / handling, submission of specimen other than nasopharyngeal swab, presence of viral mutation(s) within  the areas targeted by this assay, and inadequate number of viral copies (<250 copies / mL). A negative result must be combined with clinical observations, patient history, and epidemiological information.  Fact Sheet for Patients:   RoadLapTop.co.za  Fact Sheet for Healthcare Providers: http://kim-miller.com/  This test is not yet approved or  cleared by the [place]  FDA and has been authorized for detection and/or diagnosis of SARS-CoV-2 by FDA under an Emergency Use Authorization (EUA).  This EUA will remain in effect (meaning this test can be used) for the duration of the COVID-19 declaration under Section 564(b)(1) of the Act, 21 U.S.C. section 360bbb-3(b)(1), unless the authorization is terminated or revoked sooner.  Performed at [facility], [address] [place], [place] [address]   Surgical PCR screen     Status: None   Collection Time: 02/24/22  9:49 AM   Specimen: Nasal Mucosa; Nasal Swab  Result Value Ref Range Status   MRSA, PCR NEGATIVE NEGATIVE Final   Staphylococcus aureus NEGATIVE NEGATIVE Final    Comment: (NOTE) The Xpert SA Assay (FDA approved for NASAL specimens in patients 27 years of age and older), is one component of a comprehensive surveillance program. It is not intended to diagnose infection nor to guide or monitor treatment. Performed at [facility], [address] [place], [place] [address]    Labs: CBC: Recent Labs  Lab 04/15/24 0226 04/16/24 0503  WBC 7.6 6.6  HGB 10.1* 10.6*  HCT 30.7* 31.8*  MCV 93.6 93.5  PLT 185 205   Basic Metabolic Panel: Recent Labs  Lab 04/15/24 0226 04/16/24 0503 04/18/24 0233  NA 144 140 139  K 3.8 3.8 3.4*  CL 107 103 102  CO2 23 27 27   GLUCOSE 88 87 84  BUN 17 15 17   CREATININE 0.81 0.70 0.72  CALCIUM 8.3* 8.5* 8.4*  MG 1.7 1.8 1.7   Liver Function Tests: No results for input(s): AST, ALT,  ALKPHOS, BILITOT, PROT, ALBUMIN  in the last 168 hours. CBG: No results for input(s): GLUCAP in the last 168 hours.  Discharge time spent: greater than 30 minutes.  Author: Yetta Blanch, MD  [facility] 04/18/2024

## 2024-05-02 ENCOUNTER — Inpatient Hospital Stay (HOSPITAL_COMMUNITY)
Admission: EM | Admit: 2024-05-02 | Discharge: 2024-05-11 | DRG: 857 | Disposition: A | Discharge status: Skilled Nursing Facility | Attending: Hospitalist | Admitting: Hospitalist

## 2024-05-02 ENCOUNTER — Encounter (HOSPITAL_COMMUNITY): Payer: Self-pay

## 2024-05-02 ENCOUNTER — Other Ambulatory Visit: Payer: Self-pay

## 2024-05-02 DIAGNOSIS — Z888 Allergy status to other drugs, medicaments and biological substances status: Secondary | ICD-10-CM | POA: Diagnosis not present

## 2024-05-02 DIAGNOSIS — S72452A Displaced supracondylar fracture without intracondylar extension of lower end of left femur, initial encounter for closed fracture: Secondary | ICD-10-CM | POA: Diagnosis present

## 2024-05-02 DIAGNOSIS — E878 Other disorders of electrolyte and fluid balance, not elsewhere classified: Secondary | ICD-10-CM | POA: Diagnosis present

## 2024-05-02 DIAGNOSIS — B9689 Other specified bacterial agents as the cause of diseases classified elsewhere: Secondary | ICD-10-CM | POA: Diagnosis not present

## 2024-05-02 DIAGNOSIS — Z91040 Latex allergy status: Secondary | ICD-10-CM | POA: Diagnosis not present

## 2024-05-02 DIAGNOSIS — Z7901 Long term (current) use of anticoagulants: Secondary | ICD-10-CM | POA: Diagnosis not present

## 2024-05-02 DIAGNOSIS — Z79899 Other long term (current) drug therapy: Secondary | ICD-10-CM

## 2024-05-02 DIAGNOSIS — K219 Gastro-esophageal reflux disease without esophagitis: Secondary | ICD-10-CM | POA: Diagnosis present

## 2024-05-02 DIAGNOSIS — L089 Local infection of the skin and subcutaneous tissue, unspecified: Secondary | ICD-10-CM | POA: Diagnosis not present

## 2024-05-02 DIAGNOSIS — T847XXD Infection and inflammatory reaction due to other internal orthopedic prosthetic devices, implants and grafts, subsequent encounter: Secondary | ICD-10-CM | POA: Diagnosis not present

## 2024-05-02 DIAGNOSIS — T8140XA Infection following a procedure, unspecified, initial encounter: Secondary | ICD-10-CM | POA: Diagnosis present

## 2024-05-02 DIAGNOSIS — T8141XA Infection following a procedure, superficial incisional surgical site, initial encounter: Secondary | ICD-10-CM | POA: Diagnosis present

## 2024-05-02 DIAGNOSIS — B965 Pseudomonas (aeruginosa) (mallei) (pseudomallei) as the cause of diseases classified elsewhere: Secondary | ICD-10-CM | POA: Diagnosis present

## 2024-05-02 DIAGNOSIS — Z9884 Bariatric surgery status: Secondary | ICD-10-CM

## 2024-05-02 DIAGNOSIS — F909 Attention-deficit hyperactivity disorder, unspecified type: Secondary | ICD-10-CM | POA: Diagnosis present

## 2024-05-02 DIAGNOSIS — R197 Diarrhea, unspecified: Secondary | ICD-10-CM | POA: Diagnosis not present

## 2024-05-02 DIAGNOSIS — L03116 Cellulitis of left lower limb: Secondary | ICD-10-CM | POA: Diagnosis present

## 2024-05-02 DIAGNOSIS — Z96652 Presence of left artificial knee joint: Secondary | ICD-10-CM | POA: Diagnosis present

## 2024-05-02 DIAGNOSIS — L0390 Cellulitis, unspecified: Secondary | ICD-10-CM | POA: Diagnosis present

## 2024-05-02 DIAGNOSIS — Z91048 Other nonmedicinal substance allergy status: Secondary | ICD-10-CM | POA: Diagnosis not present

## 2024-05-02 DIAGNOSIS — T3695XA Adverse effect of unspecified systemic antibiotic, initial encounter: Secondary | ICD-10-CM | POA: Diagnosis present

## 2024-05-02 DIAGNOSIS — T8142XD Infection following a procedure, deep incisional surgical site, subsequent encounter: Secondary | ICD-10-CM | POA: Diagnosis not present

## 2024-05-02 DIAGNOSIS — T148XXA Other injury of unspecified body region, initial encounter: Secondary | ICD-10-CM | POA: Diagnosis not present

## 2024-05-02 DIAGNOSIS — Z6838 Body mass index (BMI) 38.0-38.9, adult: Secondary | ICD-10-CM

## 2024-05-02 DIAGNOSIS — T84621A Infection and inflammatory reaction due to internal fixation device of left femur, initial encounter: Secondary | ICD-10-CM | POA: Diagnosis not present

## 2024-05-02 DIAGNOSIS — T8142XA Infection following a procedure, deep incisional surgical site, initial encounter: Secondary | ICD-10-CM

## 2024-05-02 DIAGNOSIS — D62 Acute posthemorrhagic anemia: Secondary | ICD-10-CM | POA: Diagnosis present

## 2024-05-02 DIAGNOSIS — M9712XA Periprosthetic fracture around internal prosthetic left knee joint, initial encounter: Secondary | ICD-10-CM | POA: Diagnosis present

## 2024-05-02 DIAGNOSIS — Z7985 Long-term (current) use of injectable non-insulin antidiabetic drugs: Secondary | ICD-10-CM | POA: Diagnosis not present

## 2024-05-02 DIAGNOSIS — S81002A Unspecified open wound, left knee, initial encounter: Secondary | ICD-10-CM | POA: Diagnosis not present

## 2024-05-02 DIAGNOSIS — Z792 Long term (current) use of antibiotics: Secondary | ICD-10-CM

## 2024-05-02 DIAGNOSIS — T847XXA Infection and inflammatory reaction due to other internal orthopedic prosthetic devices, implants and grafts, initial encounter: Secondary | ICD-10-CM

## 2024-05-02 DIAGNOSIS — E871 Hypo-osmolality and hyponatremia: Secondary | ICD-10-CM | POA: Diagnosis present

## 2024-05-02 DIAGNOSIS — T8149XA Infection following a procedure, other surgical site, initial encounter: Secondary | ICD-10-CM | POA: Diagnosis not present

## 2024-05-02 DIAGNOSIS — M25561 Pain in right knee: Secondary | ICD-10-CM | POA: Diagnosis present

## 2024-05-02 DIAGNOSIS — I10 Essential (primary) hypertension: Secondary | ICD-10-CM | POA: Diagnosis present

## 2024-05-02 DIAGNOSIS — Z886 Allergy status to analgesic agent status: Secondary | ICD-10-CM

## 2024-05-02 DIAGNOSIS — K521 Toxic gastroenteritis and colitis: Secondary | ICD-10-CM | POA: Diagnosis present

## 2024-05-02 DIAGNOSIS — L7632 Postprocedural hematoma of skin and subcutaneous tissue following other procedure: Secondary | ICD-10-CM | POA: Diagnosis not present

## 2024-05-02 DIAGNOSIS — E66812 Obesity, class 2: Secondary | ICD-10-CM | POA: Diagnosis present

## 2024-05-02 DIAGNOSIS — Z882 Allergy status to sulfonamides status: Secondary | ICD-10-CM

## 2024-05-02 LAB — COMPREHENSIVE METABOLIC PANEL WITH GFR
ALT: 14 U/L (ref 0–44)
AST: 22 U/L (ref 15–41)
Albumin: 3 g/dL — ABNORMAL LOW (ref 3.5–5.0)
Alkaline Phosphatase: 89 U/L (ref 38–126)
Anion gap: 10 (ref 5–15)
BUN: 18 mg/dL (ref 8–23)
CO2: 23 mmol/L (ref 22–32)
Calcium: 8.3 mg/dL — ABNORMAL LOW (ref 8.9–10.3)
Chloride: 103 mmol/L (ref 98–111)
Creatinine, Ser: 0.82 mg/dL (ref 0.44–1.00)
GFR, Estimated: >60 mL/min (ref >60)
Glucose, Bld: 117 mg/dL — ABNORMAL HIGH (ref 70–99)
Potassium: 3.6 mmol/L (ref 3.5–5.1)
Sodium: 136 mmol/L (ref 135–145)
Total Bilirubin: 1 mg/dL (ref 0.0–1.2)
Total Protein: 6.3 g/dL — ABNORMAL LOW (ref 6.5–8.1)

## 2024-05-02 LAB — CBC WITH DIFFERENTIAL/PLATELET
Abs Immature Granulocytes: 0.03 K/uL (ref 0.00–0.07)
Basophils Absolute: 0 K/uL (ref 0.0–0.1)
Basophils Relative: 0 %
Eosinophils Absolute: 0.1 K/uL (ref 0.0–0.5)
Eosinophils Relative: 1 %
HCT: 35.1 % — ABNORMAL LOW (ref 36.0–46.0)
Hemoglobin: 11.3 g/dL — ABNORMAL LOW (ref 12.0–15.0)
Immature Granulocytes: 0 %
Lymphocytes Relative: 11 %
Lymphs Abs: 1 K/uL (ref 0.7–4.0)
MCH: 30.9 pg (ref 26.0–34.0)
MCHC: 32.2 g/dL (ref 30.0–36.0)
MCV: 95.9 fL (ref 80.0–100.0)
Monocytes Absolute: 0.9 K/uL (ref 0.1–1.0)
Monocytes Relative: 10 %
Neutro Abs: 7 K/uL (ref 1.7–7.7)
Neutrophils Relative %: 78 %
Platelets: 380 K/uL (ref 150–400)
RBC: 3.66 MIL/uL — ABNORMAL LOW (ref 3.87–5.11)
RDW: 13.8 % (ref 11.5–15.5)
WBC: 9 K/uL (ref 4.0–10.5)
nRBC: 0 % (ref 0.0–0.2)

## 2024-05-02 LAB — I-STAT CHEM 8, ED
BUN: 21 mg/dL (ref 8–23)
Calcium, Ion: 1.12 mmol/L — ABNORMAL LOW (ref 1.15–1.40)
Chloride: 103 mmol/L (ref 98–111)
Creatinine, Ser: 0.8 mg/dL (ref 0.44–1.00)
Glucose, Bld: 122 mg/dL — ABNORMAL HIGH (ref 70–99)
HCT: 33 % — ABNORMAL LOW (ref 36.0–46.0)
Hemoglobin: 11.2 g/dL — ABNORMAL LOW (ref 12.0–15.0)
Potassium: 3.8 mmol/L (ref 3.5–5.1)
Sodium: 137 mmol/L (ref 135–145)
TCO2: 24 mmol/L (ref 22–32)

## 2024-05-02 LAB — I-STAT CG4 LACTIC ACID, ED: Lactic Acid, Venous: 0.6 mmol/L (ref 0.5–1.9)

## 2024-05-02 MED ORDER — HYDROMORPHONE HCL 1 MG/ML IJ SOLN
0.5000 mg | INTRAMUSCULAR | Status: DC | PRN
  Administered 2024-05-02 – 2024-05-04 (×2): 0.5 mg via INTRAVENOUS
  Filled 2024-05-02 (×4): qty 0.5

## 2024-05-02 MED ORDER — DEXTROSE IN LACTATED RINGERS 5 % IV SOLN: INTRAVENOUS | Status: AC

## 2024-05-02 MED ORDER — ONDANSETRON HCL 4 MG PO TABS
4.0000 mg | ORAL_TABLET | Freq: Four times a day (QID) | ORAL | Status: DC | PRN
  Administered 2024-05-03: 4 mg via ORAL
  Filled 2024-05-02: qty 1

## 2024-05-02 MED ORDER — SODIUM CHLORIDE 0.9 % IV SOLN
2.0000 g | Freq: Three times a day (TID) | INTRAVENOUS | Status: DC
  Administered 2024-05-02 – 2024-05-05 (×8): 2 g via INTRAVENOUS
  Filled 2024-05-02 (×8): qty 12.5

## 2024-05-02 MED ORDER — ACETAMINOPHEN 500 MG PO TABS
1000.0000 mg | ORAL_TABLET | Freq: Once | ORAL | Status: AC
  Administered 2024-05-02: 1000 mg via ORAL
  Filled 2024-05-02: qty 2

## 2024-05-02 MED ORDER — OXYCODONE HCL 5 MG PO TABS
2.5000 mg | ORAL_TABLET | Freq: Once | ORAL | Status: AC
  Administered 2024-05-02: 2.5 mg via ORAL
  Filled 2024-05-02: qty 1

## 2024-05-02 MED ORDER — ONDANSETRON HCL 4 MG/2ML IJ SOLN
4.0000 mg | Freq: Four times a day (QID) | INTRAMUSCULAR | Status: DC | PRN
  Administered 2024-05-03 – 2024-05-07 (×5): 4 mg via INTRAVENOUS
  Filled 2024-05-02 (×5): qty 2

## 2024-05-02 MED ORDER — VANCOMYCIN HCL 2000 MG/400ML IV SOLN
2000.0000 mg | Freq: Once | INTRAVENOUS | Status: AC
  Administered 2024-05-02: 2000 mg via INTRAVENOUS
  Filled 2024-05-02: qty 400

## 2024-05-02 MED ORDER — AMPHETAMINE-DEXTROAMPHETAMINE 10 MG PO TABS
20.0000 mg | ORAL_TABLET | Freq: Every day | ORAL | Status: DC
  Administered 2024-05-07 – 2024-05-11 (×5): 20 mg via ORAL
  Filled 2024-05-02 (×7): qty 2

## 2024-05-02 MED ORDER — OXYCODONE HCL 5 MG PO TABS
2.5000 mg | ORAL_TABLET | ORAL | Status: DC | PRN
  Administered 2024-05-03 – 2024-05-11 (×24): 5 mg via ORAL
  Filled 2024-05-02 (×24): qty 1

## 2024-05-02 MED ORDER — SODIUM CHLORIDE 0.9 % IV SOLN
1.0000 g | Freq: Once | INTRAVENOUS | Status: AC
  Administered 2024-05-02: 1 g via INTRAVENOUS
  Filled 2024-05-02: qty 10

## 2024-05-02 MED ORDER — VANCOMYCIN HCL 1250 MG/250ML IV SOLN
1250.0000 mg | INTRAVENOUS | Status: DC
Start: 2024-05-03 — End: 2024-05-04
  Administered 2024-05-03: 1250 mg via INTRAVENOUS
  Filled 2024-05-02: qty 250

## 2024-05-02 MED ORDER — ENOXAPARIN SODIUM 40 MG/0.4ML IJ SOSY
40.0000 mg | PREFILLED_SYRINGE | INTRAMUSCULAR | Status: DC
  Administered 2024-05-02: 40 mg via SUBCUTANEOUS
  Filled 2024-05-02: qty 0.4

## 2024-05-02 MED ORDER — HYDROCHLOROTHIAZIDE 12.5 MG PO TABS
12.5000 mg | ORAL_TABLET | Freq: Every day | ORAL | Status: DC
Start: 2024-05-03 — End: 2024-05-05
  Filled 2024-05-02: qty 1

## 2024-05-02 NOTE — ED Provider Triage Note (Signed)
 Emergency Medicine Provider Triage Evaluation Note  Kara Acevedo , a 68 y.o. female  was evaluated in triage.  Pt complains of right knee pain, redness, swelling and warmth.  Patient had femur surgery on 04/13/2024 with Dr. Kendal.  She has had increasing pain with weightbearing as well, currently ambulating with a walker.  She reports fever to 101.9 F at home last night.  She was seen in orthopedic office and sent to the ED today.  Review of Systems  Positive: Increasing swelling, pain, discharge of the left knee, fever at home Negative: Vomiting  Physical Exam  BP 133/68 (BP Location: Left Arm)   Pulse 73   Temp 98.7 F (37.1 C)   Resp 16   SpO2 98%  Gen:   Awake, no distress   Resp:  Normal effort  MSK:   Moves extremities without difficulty  Other:  Surgical scar left knee, associated erythema, warmth, some mild crusting, tenderness to palpation.  Medical Decision Making  Medically screening exam initiated at 12:51 PM.  Appropriate orders placed.  Kiriana Worthington Salinas-Johnson was informed that the remainder of the evaluation will be completed by another provider, this initial triage assessment does not replace that evaluation, and the importance of remaining in the ED until their evaluation is complete.  Labs ordered.  Patient reports having x-rays performed earlier at orthopedic office.   Desiderio Chew, PA-C 05/02/24 1253

## 2024-05-02 NOTE — Progress Notes (Signed)
 Pharmacy Antibiotic Note  Kara Acevedo is a 68 y.o. female admitted on 05/02/2024 with surgical site infection.  Pharmacy has been consulted for vancomycin  and cefepime  dosing.  Plan: Cefepime  2g IV q8h Vancomycin  2g IV x 1 as previously ordered, then 1250mg  IV q24h for estimated AUC 469 using SCr 0.8, Vd 0.5 Check vancomycin  levels at steady state, goal AUC 400-550 Follow up renal function, cultures as available, clinical progress, length of tx    Temp (24hrs), Avg:98.4 F (36.9 C), Min:98.1 F (36.7 C), Max:98.7 F (37.1 C)  Recent Labs  Lab 05/02/24 1254 05/02/24 1304  WBC 9.0  --   CREATININE 0.82 0.80  LATICACIDVEN  --  0.6    CrCl cannot be calculated (Unknown ideal weight.).    Allergies  Allergen Reactions   Ace Inhibitors Other (See Comments) and Cough    Uvula swelling   Doxycycline Nausea And Vomiting   Latex Other (See Comments)    Blisters    Sulfa Antibiotics Rash   Fluconazole Other (See Comments)    Feels like a burning sensation from inside out   Nickel Swelling and Dermatitis    Blistering    Antimicrobials this admission: Ceftriaxone  x 1 9/22 Vancomycin  9/22 >> Cefepime  9/22 >>  Dose adjustments this admission:  Microbiology results: 9/22 BCx:  Thank you for allowing pharmacy to be a part of this patient's care.  Rocky Slade, PharmD, BCPS 05/02/2024 9:14 PM

## 2024-05-02 NOTE — ED Triage Notes (Signed)
 Pt had surgery on her left knee 2 weeks ago, pt has redness, swelling and drainage coming from the surgical site as well as a fever at home. Pt alternating tylenol  and ibuprofen . Pt sent here for further eval.

## 2024-05-02 NOTE — H&P (Signed)
 History and Physical    Patient: Kara Acevedo FMW:[id number] DOB: 09/03/1955 DOA: 05/02/2024 DOS: the patient was seen and examined on 05/02/2024 PCP: Renato Dorothey HERO, NP  Patient coming from: Home  Chief Complaint:  Chief Complaint  Patient presents with   Post-op Problem   Wound Infection   HPI: Kara Acevedo is a 68 y.o. female with medical history significant of attention deficit hyper activity disorder, essential hypertension, history of bariatric surgery, GERD, history of periprosthetic fracture of the femur, osteoarthritis among other things who was admitted and had left total knee replacement on September 3.  Patient went home on the ninth.  During that hospitalization she did better but went home without antibiotics.  She had open reduction internal fixation of the distal femur fracture.  Patient started noticing swelling and redness about 3 days ago.  It started a small point but spread across the suture line.  She discussed with her family and the daughter who is a Publishing rights manager who recommended the patient sees her PCP.  Will later she was evaluated by orthopedics and started on oral clindamycin through the weekend.  Patient has taken the clindamycin but did not feel better instead symptoms have gotten worse.  She therefore decided to come to the emergency room.  Patient appears to have cellulitis underlying the left knee around the surgical site.  No obvious drainage although patient says she had drainage at home.  Orthopedic consulted and recommends admission with IV antibiotics.  They will follow along and will intervene surgically if no improvement. Review of Systems: As mentioned in the history of present illness. All other systems reviewed and are negative. Past Medical History:  Diagnosis Date   Arthritis    Hypertension    Past Surgical History:  Procedure Laterality Date   INTRAMEDULLARY (IM) NAIL INTERTROCHANTERIC Left 02/24/2022    Procedure: INTRAMEDULLARY (IM) NAIL INTERTROCHANTRIC;  Surgeon: Fidel Rogue, MD;  Location: [facility];  Service: Orthopedics;  Laterality: Left;   ORIF FEMUR FRACTURE Left 04/13/2024   Procedure: OPEN REDUCTION INTERNAL FIXATION (ORIF) DISTAL FEMUR FRACTURE, LEFT;  Surgeon: Kendal Franky SQUIBB, MD;  Location: [facility] OR;  Service: Orthopedics;  Laterality: Left;   Social History:  reports that she has never smoked. She has never used smokeless tobacco. She reports that she does not drink alcohol  and does not use drugs.  Allergies  Allergen Reactions   Ace Inhibitors Other (See Comments) and Cough    Uvula swelling   Doxycycline Nausea And Vomiting   Latex Other (See Comments)    Blisters    Sulfa Antibiotics Rash   Fluconazole Other (See Comments)    Feels like a burning sensation from inside out   Nickel Swelling and Dermatitis    Blistering    Family History  Family history unknown: Yes    Prior to Admission medications   Medication Sig Start Date End Date Taking? Authorizing Provider  amLODipine  (NORVASC ) 5 MG tablet Take 1 tablet (5 mg total) by mouth daily. Please hold this medication if your systolic blood pressure ( top number) is less than 100. Patient taking differently: Take 5 mg by mouth daily. 02/28/22   Jerri Keys, MD  amphetamine -dextroamphetamine  (ADDERALL) 20 MG tablet Take 20 mg by mouth daily.    [provider]  apixaban  (ELIQUIS ) 2.5 MG TABS tablet Take 1 tablet (2.5 mg total) by mouth 2 (two) times daily. 04/18/24 05/18/24  Danton Lauraine LABOR, PA-C  cyanocobalamin  (VITAMIN B12) 1000 MCG/ML injection 1 ml Injection  once monthly; Duration: 90 days Patient not taking: Reported on 04/13/2024 10/12/15   [provider]  docusate sodium  (COLACE) 100 MG capsule Take 1 capsule (100 mg total) by mouth 2 (two) times daily. 04/18/24   Tobie Yetta HERO, MD  Ensure Max Protein (ENSURE MAX PROTEIN) LIQD Take 330 mLs (11 oz total) by mouth 2 (two) times daily. 04/18/24   Tobie Yetta HERO, MD  hydrochlorothiazide  (HYDRODIURIL ) 12.5 MG tablet Take 1 tablet (12.5 mg total) by mouth daily. 04/19/24   Patel, Pranav M, MD  hydrocortisone 2.5 % cream Apply topically. Patient not taking: Reported on 04/13/2024 03/07/24   [provider]  ibuprofen  (ADVIL ,MOTRIN ) 800 MG tablet Take 800 mg by mouth daily as needed for moderate pain (pain score 4-6) or mild pain (pain score 1-3).    [provider]  indapamide  (LOZOL ) 2.5 MG tablet Take 1 tablet (2.5 mg total) by mouth daily as needed. edema Patient taking differently: Take 2.5 mg by mouth daily. 02/28/22   Jerri Keys, MD  methocarbamol  (ROBAXIN ) 500 MG tablet Take 1 tablet (500 mg total) by mouth every 6 (six) hours as needed for muscle spasms. 04/18/24   Danton Lauraine LABOR, PA-C  Multiple Vitamin (MULTIVITAMIN WITH MINERALS) TABS tablet Take 1 tablet by mouth daily. 04/19/24   Tobie Yetta HERO, MD  ondansetron  (ZOFRAN ) 4 MG tablet Take 4 mg by mouth daily as needed for nausea or vomiting. 11/09/23   [provider]  oxyCODONE  (OXY IR/ROXICODONE ) 5 MG immediate release tablet Take 0.5-1 tablets (2.5-5 mg total) by mouth every 4 (four) hours as needed for severe pain (pain score 7-10). 04/18/24   Danton Lauraine LABOR, PA-C  polyethylene glycol (MIRALAX  / GLYCOLAX ) 17 g packet Take 17 g by mouth daily. 04/19/24   Tobie Yetta HERO, MD  potassium chloride  SA (KLOR-CON  M) 20 MEQ tablet Take 20 mEq by mouth 3 (three) times daily.    [provider]  tirzepatide (ZEPBOUND) 2.5 MG/0.5ML Pen Inject 2.5 mg into the skin once a week. Wednesday    [provider]  Vitamin D , Ergocalciferol , (DRISDOL ) 1.25 MG (50000 UNIT) CAPS capsule Take 1 capsule (50,000 Units total) by mouth every 7 (seven) days. 04/18/24   Danton Lauraine LABOR, PA-C    Physical Exam: Vitals:   05/02/24 1712 05/02/24 1840 05/02/24 1937 05/02/24 1939  BP: 117/62 93/75 (!) 107/54   Pulse: 67 73 76   Resp: 14 16    Temp:    98.5 F (36.9 C)  TempSrc:    Oral   SpO2: 100% 99% 100%    Constitutional: Pleasant, NAD, calm, comfortable Eyes: PERRL, lids and conjunctivae normal ENMT: Mucous membranes are moist. Posterior pharynx clear of any exudate or lesions.Normal dentition.  Neck: normal, supple, no masses, no thyromegaly Respiratory: clear to auscultation bilaterally, no wheezing, no crackles. Normal respiratory effort. No accessory muscle use.  Cardiovascular: Regular rate and rhythm, no murmurs / rubs / gallops. No extremity edema. 2+ pedal pulses. No carotid bruits.  Abdomen: no tenderness, no masses palpated. No hepatosplenomegaly. Bowel sounds positive.  Musculoskeletal: Left lower extremity is swollen from the thigh across the knee to below the knee.  Warm, red, tender, slightly fluctuant around the suture site  skin: no rashes, lesions, ulcers. No induration Neurologic: CN 2-12 grossly intact. Sensation intact, DTR normal. Strength 5/5 in all 4.  Psychiatric: Normal judgment and insight. Alert and oriented x 3. Normal mood  Data Reviewed:  Temperature 98.7, blood pressure 93/75 with pulse  76.  White count is 9.0 hemoglobin 11.2.  Assessment and Plan:  #1 surgical site infection: Patient appears to have cellulitis.  Hopefully this is superficial.  Patient is going to be admitted for IV antibiotics.  Initiate Vanco and cefepime .  Blood cultures have been obtained.  Orthopedics to follow and make final recommendations.  If no improvement patient would likely get his surgery.  #2 essential hypertension: Will confirm and continue home regimen.  #3 GERD: Continue with PPIs  #4 ADHD, continue Adderall in the hospital    Advance Care Planning:   Code Status: Full Code   Consults: Dr. Kendal, orthopedic surgery  Family Communication: Daughter at bedside  Severity of Illness: The appropriate patient status for this patient is INPATIENT. Inpatient status is judged to be reasonable and necessary in order to provide the required intensity of  service to ensure the patient's safety. The patient's presenting symptoms, physical exam findings, and initial radiographic and laboratory data in the context of their chronic comorbidities is felt to place them at high risk for further clinical deterioration. Furthermore, it is not anticipated that the patient will be medically stable for discharge from the hospital within 2 midnights of admission.   * I certify that at the point of admission it is my clinical judgment that the patient will require inpatient hospital care spanning beyond 2 midnights from the point of admission due to high intensity of service, high risk for further deterioration and high frequency of surveillance required.*  AuthorBETHA SIM KNOLL, MD 05/02/2024 9:00 PM  For on call review [email or web address].

## 2024-05-02 NOTE — Progress Notes (Signed)
 ED Pharmacy Antibiotic Sign Off An antibiotic consult was received from an ED provider for Vancomycin  per pharmacy dosing for Wound infection. A chart review was completed to assess appropriateness.   The following one time order(s) were placed:  Vancomycin  2000 mg x1   Further antibiotic and/or antibiotic pharmacy consults should be ordered by the admitting provider if indicated.   Thank you for allowing pharmacy to be a part of this patient's care.   Prentice DOROTHA Favors, PharmD PGY1 Health-System Pharmacy Administration and Leadership Resident [facility]  05/02/2024 6:48 PM

## 2024-05-02 NOTE — ED Provider Notes (Signed)
 [place] EMERGENCY DEPARTMENT AT [facility] Provider Note   CSN: [id number] Arrival date & time: 05/02/24  1227     Patient presents with: Post-op Problem and Wound Infection   Kara Acevedo is a 68 y.o. female.   HPI   Presents with fever and chills.  Patient states that had a fever of 100.9 yesterday.  Patient states has been alternate between Tylenol  and ibuprofen .  Patient denies all chest pain and shortness of breath.  No cough.  No sick contacts.  No nausea vomit diarrhea.  No dysuria.  Patient states that she noticed that the incision site on her left leg was somewhat erythematous as well as some streaking and maybe some slight discharge.  Followed up with orthopedic office today who instructed her to come to the ED for further evaluation.  Otherwise, denies all complaints.  Denies any kind of pleuritic chest pain or hemoptysis.  No shortness of breath.  Previous medical history reviewed : Status post left periprosthetic distal femur fracture.  For mechanical fall.  Open reduction and internal fixation by Dr. Kendal on 9/3.    Prior to Admission medications   Medication Sig Start Date End Date Taking? Authorizing Provider  amphetamine -dextroamphetamine  (ADDERALL) 20 MG tablet Take 20 mg by mouth daily.   Yes [provider]  apixaban  (ELIQUIS ) 2.5 MG TABS tablet Take 1 tablet (2.5 mg total) by mouth 2 (two) times daily. 04/18/24 05/18/24 Yes McClung, Sarah A, PA-C  cyanocobalamin  (VITAMIN B12) 1000 MCG/ML injection  10/12/15  Yes [provider]  docusate sodium  (COLACE) 100 MG capsule Take 1 capsule (100 mg total) by mouth 2 (two) times daily. 04/18/24  Yes Tobie Yetta HERO, MD  Ensure Max Protein (ENSURE MAX PROTEIN) LIQD Take 330 mLs (11 oz total) by mouth 2 (two) times daily. 04/18/24  Yes Tobie Yetta HERO, MD  hydrochlorothiazide  (HYDRODIURIL ) 12.5 MG tablet Take 1 tablet (12.5 mg total) by mouth daily. 04/19/24  Yes Patel, Pranav M, MD   hydrocortisone 2.5 % cream Apply 1 Application topically daily as needed (for rash). 03/07/24  Yes [provider]  ibuprofen  (ADVIL ,MOTRIN ) 800 MG tablet Take 800 mg by mouth daily as needed for moderate pain (pain score 4-6) or mild pain (pain score 1-3).   Yes [provider]  methocarbamol  (ROBAXIN ) 500 MG tablet Take 1 tablet (500 mg total) by mouth every 6 (six) hours as needed for muscle spasms. 04/18/24  Yes McClung, Sarah A, PA-C  Multiple Vitamin (MULTIVITAMIN WITH MINERALS) TABS tablet Take 1 tablet by mouth daily. 04/19/24  Yes Tobie Yetta HERO, MD  ondansetron  (ZOFRAN ) 4 MG tablet Take 4 mg by mouth daily as needed for nausea or vomiting. 11/09/23  Yes [provider]  oxyCODONE  (OXY IR/ROXICODONE ) 5 MG immediate release tablet Take 0.5-1 tablets (2.5-5 mg total) by mouth every 4 (four) hours as needed for severe pain (pain score 7-10). 04/18/24  Yes McClung, Sarah A, PA-C  potassium chloride  SA (KLOR-CON  M) 20 MEQ tablet Take 20 mEq by mouth 3 (three) times daily.   Yes [provider]  tirzepatide (ZEPBOUND) 2.5 MG/0.5ML Pen Inject 2.5 mg into the skin once a week. Wednesday   Yes [provider]  Vitamin D , Ergocalciferol , (DRISDOL ) 1.25 MG (50000 UNIT) CAPS capsule Take 1 capsule (50,000 Units total) by mouth every 7 (seven) days. 04/18/24  Yes McClung, Sarah A, PA-C  amLODipine  (NORVASC ) 5 MG tablet Take 1 tablet (5 mg total) by mouth daily. Please hold this  medication if your systolic blood pressure ( top number) is less than 100. Patient taking differently: Take 5 mg by mouth daily. 02/28/22   Jerri Keys, MD  indapamide  (LOZOL ) 2.5 MG tablet Take 1 tablet (2.5 mg total) by mouth daily as needed. edema Patient not taking: Reported on 05/02/2024 02/28/22   Jerri Keys, MD    Allergies: Ace inhibitors, Doxycycline, Latex, Sulfa antibiotics, Fluconazole, and Nickel    Review of Systems  Constitutional:  Negative for chills and fever.  HENT:  Negative for  ear pain and sore throat.   Eyes:  Negative for pain and visual disturbance.  Respiratory:  Negative for cough and shortness of breath.   Cardiovascular:  Negative for chest pain and palpitations.  Gastrointestinal:  Negative for abdominal pain and vomiting.  Genitourinary:  Negative for dysuria and hematuria.  Musculoskeletal:  Negative for arthralgias and back pain.  Skin:  Negative for color change and rash.  Neurological:  Negative for seizures and syncope.  All other systems reviewed and are negative.   Updated Vital Signs BP 126/81 (BP Location: Right Arm)   Pulse 74   Temp 98.1 F (36.7 C) (Oral)   Resp 17   SpO2 100%   Physical Exam Vitals and nursing note reviewed.  Constitutional:      General: She is not in acute distress.    Appearance: She is well-developed.  HENT:     Head: Normocephalic and atraumatic.  Eyes:     Conjunctiva/sclera: Conjunctivae normal.  Cardiovascular:     Rate and Rhythm: Normal rate and regular rhythm.     Heart sounds: No murmur heard. Pulmonary:     Effort: Pulmonary effort is normal. No respiratory distress.     Breath sounds: Normal breath sounds.  Abdominal:     Palpations: Abdomen is soft.     Tenderness: There is no abdominal tenderness.  Musculoskeletal:        General: No swelling.     Cervical back: Neck supple.       Legs:  Skin:    General: Skin is warm and dry.     Capillary Refill: Capillary refill takes less than 2 seconds.  Neurological:     Mental Status: She is alert.  Psychiatric:        Mood and Affect: Mood normal.     (all labs ordered are listed, but only abnormal results are displayed) Labs Reviewed  CBC WITH DIFFERENTIAL/PLATELET - Abnormal; Notable for the following components:      Result Value   RBC 3.66 (*)    Hemoglobin 11.3 (*)    HCT 35.1 (*)    All other components within normal limits  COMPREHENSIVE METABOLIC PANEL WITH GFR - Abnormal; Notable for the following components:   Glucose, Bld  117 (*)    Calcium 8.3 (*)    Total Protein 6.3 (*)    Albumin  3.0 (*)    All other components within normal limits  I-STAT CHEM 8, ED - Abnormal; Notable for the following components:   Glucose, Bld 122 (*)    Calcium, Ion 1.12 (*)    Hemoglobin 11.2 (*)    HCT 33.0 (*)    All other components within normal limits  CULTURE, BLOOD (ROUTINE X 2)  CULTURE, BLOOD (ROUTINE X 2)  COMPREHENSIVE METABOLIC PANEL WITH GFR  CBC  I-STAT CG4 LACTIC ACID, ED  I-STAT CG4 LACTIC ACID, ED    EKG: None  Radiology: No results found.   Procedures  Medications Ordered in the ED  vancomycin  (VANCOREADY) IVPB 2000 mg/400 mL (2,000 mg Intravenous New Bag/Given 05/02/24 2117)  oxyCODONE  (Oxy IR/ROXICODONE ) immediate release tablet 2.5-5 mg (has no administration in time range)  hydrochlorothiazide  (HYDRODIURIL ) tablet 12.5 mg (has no administration in time range)  amphetamine -dextroamphetamine  (ADDERALL) tablet 20 mg (has no administration in time range)  enoxaparin  (LOVENOX ) injection 40 mg (has no administration in time range)  dextrose  5 % in lactated ringers  infusion (has no administration in time range)  HYDROmorphone  (DILAUDID ) injection 0.5 mg (0.5 mg Intravenous Given 05/02/24 2147)  ondansetron  (ZOFRAN ) tablet 4 mg (has no administration in time range)    Or  ondansetron  (ZOFRAN ) injection 4 mg (has no administration in time range)  ceFEPIme  (MAXIPIME ) 2 g in sodium chloride  0.9 % 100 mL IVPB (has no administration in time range)  vancomycin  (VANCOREADY) IVPB 1250 mg/250 mL (has no administration in time range)  acetaminophen  (TYLENOL ) tablet 1,000 mg (1,000 mg Oral Given 05/02/24 1733)  oxyCODONE  (Oxy IR/ROXICODONE ) immediate release tablet 2.5 mg (2.5 mg Oral Given 05/02/24 1733)  cefTRIAXone  (ROCEPHIN ) 1 g in sodium chloride  0.9 % 100 mL IVPB (0 g Intravenous Stopped 05/02/24 1919)                                    Medical Decision Making Risk OTC drugs. Prescription drug  management. Decision regarding hospitalization.     Presents with fever and chills.  Patient states that had a fever of 100.9 yesterday.  Patient states has been alternate between Tylenol  and ibuprofen .  Patient denies all chest pain and shortness of breath.  No cough.  No sick contacts.  No nausea vomit diarrhea.  No dysuria.  Patient states that she noticed that the incision site on her left leg was somewhat erythematous as well as some streaking and maybe some slight discharge.  Followed up with orthopedic office today who instructed her to come to the ED for further evaluation.  Otherwise, denies all complaints.  Denies any kind of pleuritic chest pain or hemoptysis.  No shortness of breath.  Previous medical history reviewed : Status post left periprosthetic distal femur fracture.  For mechanical fall.  Open reduction and internal fixation by Dr. Kendal on 9/3.    Upon exam, patient hemodynamically stable.  Afebrile.  Normotensive.  No tachycardia.  No tachypnea.   Patient had fever yesterday.  Follow-up with orthopedic surgeon today who instructed her to come to the ED for further evaluation.    Patient denies all complaints other than knee pain as well as some erythema around the incision site.  Labs unremarkable.  No leukocytosis or left shift.  Lactic acid negative.  Consulted orthopedic surgery.  Recommended IV antibiotics.  The plan is to watch the patient and if patient does not improve by Wednesday then would likely have to take to the OR for debridement.     Final diagnoses:  Cellulitis, unspecified cellulitis site  Wound infection    ED Discharge Orders     None          Simon Lavonia SAILOR, MD 05/02/24 2237

## 2024-05-02 NOTE — ED Notes (Signed)
 Per provider, second I stat lactic not necessary

## 2024-05-02 NOTE — ED Triage Notes (Signed)
 Pt here for left knee surgical site infection, pt sent by PCP. C/O fevers.

## 2024-05-03 DIAGNOSIS — T148XXA Other injury of unspecified body region, initial encounter: Secondary | ICD-10-CM | POA: Diagnosis not present

## 2024-05-03 DIAGNOSIS — L089 Local infection of the skin and subcutaneous tissue, unspecified: Secondary | ICD-10-CM | POA: Diagnosis not present

## 2024-05-03 DIAGNOSIS — L0390 Cellulitis, unspecified: Secondary | ICD-10-CM

## 2024-05-03 LAB — CBC
HCT: 32.3 % — ABNORMAL LOW (ref 36.0–46.0)
Hemoglobin: 10.6 g/dL — ABNORMAL LOW (ref 12.0–15.0)
MCH: 31.6 pg (ref 26.0–34.0)
MCHC: 32.8 g/dL (ref 30.0–36.0)
MCV: 96.4 fL (ref 80.0–100.0)
Platelets: 311 K/uL (ref 150–400)
RBC: 3.35 MIL/uL — ABNORMAL LOW (ref 3.87–5.11)
RDW: 13.7 % (ref 11.5–15.5)
WBC: 6.5 K/uL (ref 4.0–10.5)
nRBC: 0 % (ref 0.0–0.2)

## 2024-05-03 LAB — COMPREHENSIVE METABOLIC PANEL WITH GFR
ALT: 14 U/L (ref 0–44)
AST: 22 U/L (ref 15–41)
Albumin: 2.5 g/dL — ABNORMAL LOW (ref 3.5–5.0)
Alkaline Phosphatase: 74 U/L (ref 38–126)
Anion gap: 7 (ref 5–15)
BUN: 13 mg/dL (ref 8–23)
CO2: 26 mmol/L (ref 22–32)
Calcium: 8.3 mg/dL — ABNORMAL LOW (ref 8.9–10.3)
Chloride: 105 mmol/L (ref 98–111)
Creatinine, Ser: 0.77 mg/dL (ref 0.44–1.00)
GFR, Estimated: >60 mL/min (ref >60)
Glucose, Bld: 115 mg/dL — ABNORMAL HIGH (ref 70–99)
Potassium: 3.6 mmol/L (ref 3.5–5.1)
Sodium: 138 mmol/L (ref 135–145)
Total Bilirubin: 0.9 mg/dL (ref 0.0–1.2)
Total Protein: 5.6 g/dL — ABNORMAL LOW (ref 6.5–8.1)

## 2024-05-03 MED ORDER — METHOCARBAMOL 500 MG PO TABS
500.0000 mg | ORAL_TABLET | Freq: Four times a day (QID) | ORAL | Status: DC | PRN
  Administered 2024-05-03 – 2024-05-11 (×4): 500 mg via ORAL
  Filled 2024-05-03 (×4): qty 1

## 2024-05-03 MED ORDER — ENOXAPARIN SODIUM 60 MG/0.6ML IJ SOSY
50.0000 mg | PREFILLED_SYRINGE | INTRAMUSCULAR | Status: DC
  Administered 2024-05-03: 50 mg via SUBCUTANEOUS
  Filled 2024-05-03: qty 0.6

## 2024-05-03 MED ORDER — ACETAMINOPHEN 500 MG PO TABS
1000.0000 mg | ORAL_TABLET | Freq: Four times a day (QID) | ORAL | Status: DC | PRN
  Administered 2024-05-03 – 2024-05-10 (×13): 1000 mg via ORAL
  Filled 2024-05-03 (×13): qty 2

## 2024-05-03 MED ORDER — ENSURE PLUS HIGH PROTEIN PO LIQD
237.0000 mL | Freq: Three times a day (TID) | ORAL | Status: DC
  Administered 2024-05-03 – 2024-05-11 (×23): 237 mL via ORAL
  Filled 2024-05-03 (×2): qty 237

## 2024-05-03 NOTE — Evaluation (Signed)
 Physical Therapy Evaluation Patient Details Name: Kara Acevedo MRN: [id number] DOB: Jun 16, 1956 Today's Date: 05/03/2024  History of Present Illness  Suzette Flagler is an 68 y.o. female who presents on 05/02/24 with swelling and redness L knee surgical site incision.  PMHx: HTN, ADHD, bariatric surgery, GERD, arthritis, R TKA, L TKA,  left hip IM nail (02/24/22), L periprosthetic distal femur fx with ORIF 04/13/24  Clinical Impression  Pt admitted with above diagnosis. Pt from home where she has been working with HHPT and ambulating with RW. Pt able to come to EOB and stand with RW without assist. With ambulation she is not currently tolerating full WB'ing LLE due to pain and is unable to get L heel to floor. Instructed on frequent but short bursts of activity and gentle ROM to L knee. Recommend continuance of HHPT at d/c.  Pt currently with functional limitations due to the deficits listed below (see PT Problem List). Pt will benefit from acute skilled PT to increase their independence and safety with mobility to allow discharge.           If plan is discharge home, recommend the following: Assistance with cooking/housework;Assist for transportation;Help with stairs or ramp for entrance;A little help with bathing/dressing/bathroom;A little help with walking and/or transfers   Can travel by private vehicle   Yes    Equipment Recommendations None recommended by PT  Recommendations for Other Services       Functional Status Assessment Patient has had a recent decline in their functional status and demonstrates the ability to make significant improvements in function in a reasonable and predictable amount of time.     Precautions / Restrictions Precautions Precautions: Fall Recall of Precautions/Restrictions: Intact Restrictions Weight Bearing Restrictions Per Provider Order: No LLE Weight Bearing Per Provider Order: Weight bearing as tolerated      Mobility   Bed Mobility Overal bed mobility: Modified Independent Bed Mobility: Supine to Sit, Sit to Supine     Supine to sit: Modified independent (Device/Increase time) Sit to supine: Modified independent (Device/Increase time)   General bed mobility comments: pt able to come to EOB independently as well as return to supine. Able to lift LLE against gravity into bed without assist    Transfers Overall transfer level: Needs assistance Equipment used: Rolling walker (2 wheels) Transfers: Sit to/from Stand, Bed to chair/wheelchair/BSC Sit to Stand: Contact guard assist, Supervision           General transfer comment: CGA for safety    Ambulation/Gait Ambulation/Gait assistance: Contact guard assist Gait Distance (Feet): 25 Feet Assistive device: Rolling walker (2 wheels) Gait Pattern/deviations: Decreased stance time - left, Decreased weight shift to left, Decreased stride length, Decreased step length - right, Decreased step length - left, Step-to pattern Gait velocity: decreased Gait velocity interpretation: <1.8 ft/sec, indicate of risk for recurrent falls   General Gait Details: pt not tolerating full wt on LLE, unable to get L heel to floor due to pain. Taking wt through UE's on RW to tolerate pain and be able to mobilize  Stairs            Wheelchair Mobility     Tilt Bed    Modified Rankin (Stroke Patients Only)       Balance Overall balance assessment: Needs assistance Sitting-balance support: No upper extremity supported, Feet supported Sitting balance-Leahy Scale: Good     Standing balance support: Single extremity supported, During functional activity Standing balance-Leahy Scale: Poor Standing balance comment: needs at least  unilateral UE support due to inability to put full wt through LLE                             Pertinent Vitals/Pain Pain Assessment Pain Assessment: 0-10 Pain Score: 5  Pain Location: L lateral knee Pain Descriptors  / Indicators: Discomfort, Grimacing Pain Intervention(s): Limited activity within patient's tolerance, Monitored during session, Patient requesting pain meds-RN notified    Home Living Family/patient expects to be discharged to:: Private residence Living Arrangements: Children;Other relatives Available Help at Discharge: Family;Available PRN/intermittently Type of Home: House Home Access: Level entry     Alternate Level Stairs-Number of Steps: flight Home Layout: Two level;Able to live on main level with bedroom/bathroom Home Equipment: Rolling Walker (2 wheels);Shower seat;BSC/3in1 Additional Comments: pt's daughter and family live with her. Pt stays in the basement with level entry. Has 2 dogs    Prior Function Prior Level of Function : History of Falls (last six months);Independent/Modified Independent             Mobility Comments: has been ambulating with RW since fall 04/13/24 ADLs Comments: independent     Extremity/Trunk Assessment   Upper Extremity Assessment Upper Extremity Assessment: Defer to OT evaluation    Lower Extremity Assessment Lower Extremity Assessment: LLE deficits/detail LLE Deficits / Details: knee ROM 0- 85 deg. Hip ext 3/5, knee ext 3-/5. Lateral knee incision red and oozing LLE: Unable to fully assess due to pain LLE Sensation: WNL LLE Coordination: WNL    Cervical / Trunk Assessment Cervical / Trunk Assessment: Normal  Communication   Communication Communication: No apparent difficulties    Cognition Arousal: Alert Behavior During Therapy: WFL for tasks assessed/performed   PT - Cognitive impairments: No apparent impairments                       PT - Cognition Comments: able to direct care and follow all instructions Following commands: Intact       Cueing Cueing Techniques: Verbal cues     General Comments General comments (skin integrity, edema, etc.): assisted pt to bathroom. Pt independent with management of  clothes and perineal care    Exercises     Assessment/Plan    PT Assessment Patient needs continued PT services  PT Problem List Decreased strength;Pain;Decreased range of motion;Decreased activity tolerance;Decreased balance;Decreased mobility;Decreased safety awareness;Decreased knowledge of use of DME;Decreased knowledge of precautions       PT Treatment Interventions DME instruction;Gait training;Functional mobility training;Therapeutic activities;Therapeutic exercise;Balance training;Neuromuscular re-education;Patient/family education    PT Goals (Current goals can be found in the Care Plan section)  Acute Rehab PT Goals Patient Stated Goal: get better PT Goal Formulation: With patient Time For Goal Achievement: 05/17/24 Potential to Achieve Goals: Good    Frequency Min 2X/week     Co-evaluation               AM-PAC PT 6 Clicks Mobility  Outcome Measure Help needed turning from your back to your side while in a flat bed without using bedrails?: None Help needed moving from lying on your back to sitting on the side of a flat bed without using bedrails?: None Help needed moving to and from a bed to a chair (including a wheelchair)?: A Little Help needed standing up from a chair using your arms (e.g., wheelchair or bedside chair)?: A Little Help needed to walk in hospital room?: A Little Help needed climbing 3-5 steps  with a railing? : A Lot 6 Click Score: 19    End of Session   Activity Tolerance: Patient tolerated treatment well;Other (comment) (nauseous end of session) Patient left: in bed;with nursing/sitter in room;with call bell/phone within reach Nurse Communication: Mobility status PT Visit Diagnosis: Pain;History of falling (Z91.81);Difficulty in walking, not elsewhere classified (R26.2) Pain - Right/Left: Left Pain - part of body: Leg    Time: 8493-8472 PT Time Calculation (min) (ACUTE ONLY): 21 min   Charges:   PT Evaluation $PT Eval Low  Complexity: 1 Low   PT General Charges $$ ACUTE PT VISIT: 1 Visit         Richerd Lipoma, PT  Acute Rehab Services Secure chat preferred Office [phone]   Richerd CROME Jasmeet Gehl 05/03/2024, 3:42 PM

## 2024-05-03 NOTE — Plan of Care (Signed)

## 2024-05-03 NOTE — TOC Initial Note (Signed)
 Transition of Care (TOC) - Initial/Assessment Note   Spoke to patient at bedside. Patient from home with two daughters.   Patient has cane, walker and bedside commode at home already. Prior to admission patient active with [facility] for HHPT/OT. Cory with [facility] aware of patient admission. [facility] will need new orders and face to face at discharge to continue services.   Inpatient Care Management Team will continue to follow for discharge needs  Patient Details  Name: Kara Acevedo MRN: [id number] Date of Birth: 07/27/1956  Transition of Care [facility]) CM/SW Contact:    Stephane Powell Jansky, RN Phone Number: 05/03/2024, 10:49 AM  Clinical Narrative:                   Expected Discharge Plan: Home w Home Health Services Barriers to Discharge: Continued Medical Work up   Patient Goals and CMS Choice Patient states their goals for this hospitalization and ongoing recovery are:: to return to home          Expected Discharge Plan and Services In-house Referral: NA Discharge Planning Services: CM Consult   Living arrangements for the past 2 months: Single Family Home                 DME Arranged: N/A DME Agency: NA       HH Arranged: PT, OT HH Agency: [facility] Date [facility] Agency Contacted: 05/03/24 Time HH Agency Contacted: 1048 Representative spoke with at [facility] Agency: Darleene with [facility] aware of patient admission. [facility] will need new orders and face to face at discharge to continue services.  Prior Living Arrangements/Services Living arrangements for the past 2 months: Single Family Home Lives with:: Adult Children   Do you feel safe going back to the place where you live?: Yes      Need for Family Participation in Patient Care: Yes (Comment) Care giver support system in place?: Yes (comment) Current home services: DME Criminal Activity/Legal Involvement Pertinent to Current Situation/Hospitalization: No - Comment as needed  Activities of Daily  Living   ADL Screening (condition at time of admission) Independently performs ADLs?: Yes (appropriate for developmental age) Is the patient deaf or have difficulty hearing?: No Does the patient have difficulty seeing, even when wearing glasses/contacts?: No Does the patient have difficulty concentrating, remembering, or making decisions?: No  Permission Sought/Granted   Permission granted to share information with : Yes, Verbal Permission Granted     Permission granted to share info w AGENCY: [facility]        Emotional Assessment   Attitude/Demeanor/Rapport: Engaged Affect (typically observed): Appropriate Orientation: : Oriented to Self, Oriented to Place, Oriented to  Time, Oriented to Situation Alcohol  / Substance Use: Not Applicable Psych Involvement: No (comment)  Admission diagnosis:  Post-operative infection [T81.40XA] Patient Active Problem List   Diagnosis Date Noted   Post-operative infection 05/02/2024   History of bariatric surgery 05/02/2024   Periprosthetic fracture of shaft of femur 04/15/2024   Hip fracture (HCC) 02/23/2022   Hypokalemia 02/23/2022   ADHD 02/23/2022   Essential hypertension 02/23/2022   Rib pain 02/23/2022   Morbid obesity (HCC) 02/23/2022   Esophageal reflux 08/19/2012   PCP:  Renato Dorothey HERO, NP Pharmacy:   [facility] [phone] - [place], [place] - [address] AT [address]NER OF [address] [place] [address] Phone: [phone] Fax: [phone]     Social Drivers of Health (SDOH) Social History: SDOH Screenings   Food Insecurity: No Food Insecurity (05/02/2024)  Housing: Low Risk  (05/02/2024)  Transportation Needs: No Transportation Needs (05/02/2024)  Utilities: Not At Risk (05/02/2024)  Financial Resource Strain: Low Risk  (09/27/2021)   Received from [facility]  Social Connections: Socially Integrated (05/02/2024)  Stress: No Stress Concern Present (09/27/2021)   Received from [facility]  Tobacco Use: Low Risk  (05/02/2024)  Recent Concern: Tobacco Use - Medium Risk (03/07/2024)   Received from [facility]   SDOH Interventions:     Readmission Risk Interventions     No data to display

## 2024-05-03 NOTE — Progress Notes (Signed)
 PROGRESS NOTE    Kara Acevedo  FMW:[id number] DOB: [birth year] DOA: 05/02/2024 PCP: Renato Dorothey HERO, NP    Brief Narrative:  Kara Acevedo is a 68 y.o. female with medical history significant of attention deficit hyper activity disorder, essential hypertension, history of bariatric surgery, GERD, history of periprosthetic fracture of the femur, osteoarthritis among other things who was admitted and had left total knee replacement on September 3.  Patient went home on the ninth.   Patient started noticing swelling and redness about 3 days ago.  It started a small point but spread across the suture line.  She discussed with her family and the daughter who is a Publishing rights manager who recommended the patient sees her PCP.  Will later she was evaluated by orthopedics and started on oral clindamycin through the weekend.  Patient has taken the clindamycin but did not feel better instead symptoms have gotten worse.    Assessment and Plan: surgical site infection: - Ortho consulted - Plan to continue IV antibiotics - Ortho to reevaluate on 9/23 and possibly take to the OR if indicated-n.p.o. after midnight     essential hypertension:  -hold meds while BP low   GERD:  - PPIs    ADHD, - Adderall in the hospital   DVT prophylaxis:     Code Status: Full Code Family Communication:   Disposition Plan:  Level of care: Med-Surg Status is: Inpatient     Consultants:  Ortho    Subjective: Asking for protein shakes  Objective: Vitals:   05/02/24 2105 05/02/24 2330 05/03/24 0245 05/03/24 0855  BP: 126/81  116/68 (!) 110/50  Pulse: 74  79 73  Resp: 17  17 18   Temp: 98.1 F (36.7 C)  98.9 F (37.2 C) 99.3 F (37.4 C)  TempSrc: Oral  Oral Oral  SpO2: 100%  100% 97%  Weight:  104.3 kg    Height:  5' 5 (1.651 m)      Intake/Output Summary (Last 24 hours) at 05/03/2024 1122 Last data filed at 05/03/2024 9187 Gross per 24 hour  Intake 465.51 ml   Output --  Net 465.51 ml   Filed Weights   05/02/24 2330  Weight: 104.3 kg    Examination:   General: Appearance:    Obese female in no acute distress     Lungs:     respirations unlabored  Heart:    Normal heart rate.     MS:   All extremities are intact.    Neurologic:   Awake, alert       Data Reviewed: I have personally reviewed following labs and imaging studies  CBC: Recent Labs  Lab 05/02/24 1254 05/02/24 1304 05/03/24 0327  WBC 9.0  --  6.5  NEUTROABS 7.0  --   --   HGB 11.3* 11.2* 10.6*  HCT 35.1* 33.0* 32.3*  MCV 95.9  --  96.4  PLT 380  --  311   Basic Metabolic Panel: Recent Labs  Lab 05/02/24 1254 05/02/24 1304 05/03/24 0327  NA 136 137 138  K 3.6 3.8 3.6  CL 103 103 105  CO2 23  --  26  GLUCOSE 117* 122* 115*  BUN 18 21 13   CREATININE 0.82 0.80 0.77  CALCIUM 8.3*  --  8.3*   GFR: Estimated Creatinine Clearance: 80.6 mL/min (by C-G formula based on SCr of 0.77 mg/dL). Liver Function Tests: Recent Labs  Lab 05/02/24 1254 05/03/24 0327  AST 22 22  ALT 14 14  ALKPHOS  89 74  BILITOT 1.0 0.9  PROT 6.3* 5.6*  ALBUMIN  3.0* 2.5*   No results for input(s): LIPASE, AMYLASE in the last 168 hours. No results for input(s): AMMONIA in the last 168 hours. Coagulation Profile: No results for input(s): INR, PROTIME in the last 168 hours. Cardiac Enzymes: No results for input(s): CKTOTAL, CKMB, CKMBINDEX, TROPONINI in the last 168 hours. BNP (last 3 results) No results for input(s): PROBNP in the last 8760 hours. HbA1C: No results for input(s): HGBA1C in the last 72 hours. CBG: No results for input(s): GLUCAP in the last 168 hours. Lipid Profile: No results for input(s): CHOL, HDL, LDLCALC, TRIG, CHOLHDL, LDLDIRECT in the last 72 hours. Thyroid Function Tests: No results for input(s): TSH, T4TOTAL, FREET4, T3FREE, THYROIDAB in the last 72 hours. Anemia Panel: No results for input(s):  VITAMINB12, FOLATE, FERRITIN, TIBC, IRON, RETICCTPCT in the last 72 hours. Sepsis Labs: Recent Labs  Lab 05/02/24 1304  LATICACIDVEN 0.6    Recent Results (from the past 240 hours)  Blood culture (routine x 2)     Status: None (Preliminary result)   Collection Time: 05/02/24  5:40 PM   Specimen: BLOOD  Result Value Ref Range Status   Specimen Description BLOOD LEFT ANTECUBITAL  Final   Special Requests   Final    BOTTLES DRAWN AEROBIC AND ANAEROBIC Blood Culture adequate volume   Culture   Final    NO GROWTH < 12 HOURS Performed at [facility], [address], [place], [place] [address]    Report Status PENDING  Incomplete  Blood culture (routine x 2)     Status: None (Preliminary result)   Collection Time: 05/02/24  6:40 PM   Specimen: BLOOD  Result Value Ref Range Status   Specimen Description BLOOD RIGHT ANTECUBITAL  Final   Special Requests   Final    BOTTLES DRAWN AEROBIC AND ANAEROBIC Blood Culture results may not be optimal due to an inadequate volume of blood received in culture bottles   Culture   Final    NO GROWTH < 12 HOURS Performed at [facility], [address] [place], [place] [address]    Report Status PENDING  Incomplete         Radiology Studies: No results found.      Scheduled Meds:  amphetamine -dextroamphetamine   20 mg Oral Daily   enoxaparin  (LOVENOX ) injection  50 mg Subcutaneous Q24H   feeding supplement  237 mL Oral TID BM   hydrochlorothiazide   12.5 mg Oral Daily   Continuous Infusions:  ceFEPime  (MAXIPIME ) IV 2 g (05/03/24 0653)   dextrose  5% lactated ringers  40 mL/hr at 05/03/24 0958   vancomycin        LOS: 1 day    Time spent: 45 minutes spent on chart review, discussion with nursing staff, consultants, updating family and interview/physical exam; more than 50% of that time was spent in counseling and/or coordination of care.    Harlene RAYMOND Bowl, DO [facility] Available via Epic secure  chat 7am-7pm After these hours, please refer to coverage provider listed on [email or web address] 05/03/2024, 11:22 AM

## 2024-05-03 NOTE — Progress Notes (Signed)
 Orthopaedic Trauma Progress Note  SUBJECTIVE: Patient seen in OTS clinic yesterday for first postop appointment.  Noted to have significant redness around her incision x 5 days.  Had been started on clindamycin on 04/29/2024, but unfortunately wound appeared to worsen over the next 2 to 3 days.  Patient with subjective fevers (Tmax 100.8).  Following yesterday's appointment, it was recommended patient present to the emergency department for medical admission to receive IV antibiotics.  Patient has been evaluated on 6N this morning.  Per her report, she feels that the wound looks slightly better in her opinion today.  Notes the redness has decreased compared to the initial border that was drawn on 05/01/2024.  Denies any significant drainage from the incision.  Has not had any fevers since admission (recorded Tmax 98.9).  Tolerating diet and fluids.  Has not been out of bed since admission yesterday.  Labs since admission have been reassuring.  Blood cultures NGTD.  Lactic acid remains WNL.  Was able to mobilize with rolling walker in the outpatient clinic when seen.   OBJECTIVE:  Vitals:   05/02/24 2105 05/03/24 0245  BP: 126/81 116/68  Pulse: 74 79  Resp: 17 17  Temp: 98.1 F (36.7 C) 98.9 F (37.2 C)  SpO2: 100% 100%    Opiates Today (MME): Today's  total administered Morphine  Milligram Equivalents: 7.5 Opiates Yesterday (MME): Yesterday's total administered Morphine  Milligram Equivalents: 13.75  General: Sitting up in bed comfortably.  NAD.  Pleasant and cooperative Respiratory: No increased work of breathing.  Operative Extremity (LLE): Nonstick dressing over the knee removed to evaluate wound.  Continues to have notable redness around incision but does involved area appears smaller compared to previous exams.  Redness well within demarcated borders from 05/01/2024.  Some soreness about the knee as expected but otherwise no significant tenderness throughout the thigh or lower leg.  Ankle  DF/PF intact.  + EHL/FHL.  Endorses sensation all aspects of the foot.  Toes warm and well-perfused. + DP pulse  IMAGING: Repeat imaging left femur performed in OTS clinic on 05/02/2024 stable.  No signs of any hardware failure or loosening.  Fracture remains appropriately aligned.  LABS:  Results for orders placed or performed during the hospital encounter of 05/02/24 (from the past 24 hours)  CBC with Differential     Status: Abnormal   Collection Time: 05/02/24 12:54 PM  Result Value Ref Range   WBC 9.0 4.0 - 10.5 K/uL   RBC 3.66 (L) 3.87 - 5.11 MIL/uL   Hemoglobin 11.3 (L) 12.0 - 15.0 g/dL   HCT 64.8 (L) 63.9 - 53.9 %   MCV 95.9 80.0 - 100.0 fL   MCH 30.9 26.0 - 34.0 pg   MCHC 32.2 30.0 - 36.0 g/dL   RDW 86.1 88.4 - 84.4 %   Platelets 380 150 - 400 K/uL   nRBC 0.0 0.0 - 0.2 %   Neutrophils Relative % 78 %   Neutro Abs 7.0 1.7 - 7.7 K/uL   Lymphocytes Relative 11 %   Lymphs Abs 1.0 0.7 - 4.0 K/uL   Monocytes Relative 10 %   Monocytes Absolute 0.9 0.1 - 1.0 K/uL   Eosinophils Relative 1 %   Eosinophils Absolute 0.1 0.0 - 0.5 K/uL   Basophils Relative 0 %   Basophils Absolute 0.0 0.0 - 0.1 K/uL   Immature Granulocytes 0 %   Abs Immature Granulocytes 0.03 0.00 - 0.07 K/uL  Comprehensive metabolic panel     Status: Abnormal   Collection  Time: 05/02/24 12:54 PM  Result Value Ref Range   Sodium 136 135 - 145 mmol/L   Potassium 3.6 3.5 - 5.1 mmol/L   Chloride 103 98 - 111 mmol/L   CO2 23 22 - 32 mmol/L   Glucose, Bld 117 (H) 70 - 99 mg/dL   BUN 18 8 - 23 mg/dL   Creatinine, Ser 9.17 0.44 - 1.00 mg/dL   Calcium 8.3 (L) 8.9 - 10.3 mg/dL   Total Protein 6.3 (L) 6.5 - 8.1 g/dL   Albumin  3.0 (L) 3.5 - 5.0 g/dL   AST 22 15 - 41 U/L   ALT 14 0 - 44 U/L   Alkaline Phosphatase 89 38 - 126 U/L   Total Bilirubin 1.0 0.0 - 1.2 mg/dL   GFR, Estimated >39 >39 mL/min   Anion gap 10 5 - 15  I-Stat CG4 Lactic Acid     Status: None   Collection Time: 05/02/24  1:04 PM  Result Value Ref  Range   Lactic Acid, Venous 0.6 0.5 - 1.9 mmol/L  I-stat chem 8, ed     Status: Abnormal   Collection Time: 05/02/24  1:04 PM  Result Value Ref Range   Sodium 137 135 - 145 mmol/L   Potassium 3.8 3.5 - 5.1 mmol/L   Chloride 103 98 - 111 mmol/L   BUN 21 8 - 23 mg/dL   Creatinine, Ser 9.19 0.44 - 1.00 mg/dL   Glucose, Bld 877 (H) 70 - 99 mg/dL   Calcium, Ion 8.87 (L) 1.15 - 1.40 mmol/L   TCO2 24 22 - 32 mmol/L   Hemoglobin 11.2 (L) 12.0 - 15.0 g/dL   HCT 66.9 (L) 63.9 - 53.9 %  Blood culture (routine x 2)     Status: None (Preliminary result)   Collection Time: 05/02/24  5:40 PM   Specimen: BLOOD  Result Value Ref Range   Specimen Description BLOOD LEFT ANTECUBITAL    Special Requests      BOTTLES DRAWN AEROBIC AND ANAEROBIC Blood Culture adequate volume   Culture      NO GROWTH < 12 HOURS Performed at [id number], [address] [place], [place] [address]    Report Status PENDING   Blood culture (routine x 2)     Status: None (Preliminary result)   Collection Time: 05/02/24  6:40 PM   Specimen: BLOOD  Result Value Ref Range   Specimen Description BLOOD RIGHT ANTECUBITAL    Special Requests      BOTTLES DRAWN AEROBIC AND ANAEROBIC Blood Culture results may not be optimal due to an inadequate volume of blood received in culture bottles   Culture      NO GROWTH < 12 HOURS Performed at [facility], [address] [place], [place] [address]    Report Status PENDING   Comprehensive metabolic panel     Status: Abnormal   Collection Time: 05/03/24  3:27 AM  Result Value Ref Range   Sodium 138 135 - 145 mmol/L   Potassium 3.6 3.5 - 5.1 mmol/L   Chloride 105 98 - 111 mmol/L   CO2 26 22 - 32 mmol/L   Glucose, Bld 115 (H) 70 - 99 mg/dL   BUN 13 8 - 23 mg/dL   Creatinine, Ser 9.22 0.44 - 1.00 mg/dL   Calcium 8.3 (L) 8.9 - 10.3 mg/dL   Total Protein 5.6 (L) 6.5 - 8.1 g/dL   Albumin  2.5 (L) 3.5 - 5.0 g/dL   AST 22 15 -  41 U/L   ALT 14 0 - 44 U/L   Alkaline  Phosphatase 74 38 - 126 U/L   Total Bilirubin 0.9 0.0 - 1.2 mg/dL   GFR, Estimated >39 >39 mL/min   Anion gap 7 5 - 15  CBC     Status: Abnormal   Collection Time: 05/03/24  3:27 AM  Result Value Ref Range   WBC 6.5 4.0 - 10.5 K/uL   RBC 3.35 (L) 3.87 - 5.11 MIL/uL   Hemoglobin 10.6 (L) 12.0 - 15.0 g/dL   HCT 67.6 (L) 63.9 - 53.9 %   MCV 96.4 80.0 - 100.0 fL   MCH 31.6 26.0 - 34.0 pg   MCHC 32.8 30.0 - 36.0 g/dL   RDW 86.2 88.4 - 84.4 %   Platelets 311 150 - 400 K/uL   nRBC 0.0 0.0 - 0.2 %    ASSESSMENT: Brynna Dobos is a 68 y.o. female s/p fall Procedures: OPEN REDUCTION INTERNAL FIXATION LEFT DISTAL FEMUR FRACTURE 04/13/2024   CV/Blood loss: Acute blood loss anemia, Hgb 10.6 this AM. Hemodynamically stable  PLAN: Weightbearing: WBAT LLE ROM:  Unrestricted ROM  Incisional and dressing care:  Change as needed Showering:  Ok to begin getting incisions wet when bathing Orthopedic device(s): None  Pain management:  1. Tylenol  1000 mg q 6 hours PRN 2. Robaxin  500 mg q 6 hours PRN 3. Oxycodone  2.5-5 mg q 4 hours PRN 4. Dilaudid  0.5 mg q 2 hours PRN VTE prophylaxis: Lovenox , SCDs ID: Cefepime  and vancomycin  Foley/Lines:  No foley, KVO IVFs Impediments to Fracture Healing: Vitamin D  level 28, continue on supplementation Dispo: Appearance of wound improved slightly from 05/02/2024.  Recommend continue with IV antibiotics.  Will reevaluate wound tomorrow a.m. to determine if surgical intervention is needed.  Hopeful that antibiotics alone will be enough.  Please keep patient NPO at midnight in case surgical intervention required    Follow - up plan: To be determined  Contact information:  Franky Light MD, Lauraine Moores PA-C. After hours and holidays please check [email or web address] for group call information for [facility]   Lauraine PATRIC Moores, PA-C [phone] (office) [email or web address]

## 2024-05-04 ENCOUNTER — Encounter (HOSPITAL_COMMUNITY): Payer: Self-pay | Admitting: Internal Medicine

## 2024-05-04 ENCOUNTER — Inpatient Hospital Stay (HOSPITAL_COMMUNITY): Admitting: Anesthesiology

## 2024-05-04 ENCOUNTER — Encounter (HOSPITAL_COMMUNITY)
Admission: EM | Disposition: A | Discharge status: Skilled Nursing Facility | Payer: Self-pay | Source: Home / Self Care | Attending: Internal Medicine

## 2024-05-04 ENCOUNTER — Other Ambulatory Visit: Payer: Self-pay

## 2024-05-04 ENCOUNTER — Inpatient Hospital Stay (HOSPITAL_COMMUNITY)

## 2024-05-04 DIAGNOSIS — T8142XD Infection following a procedure, deep incisional surgical site, subsequent encounter: Secondary | ICD-10-CM

## 2024-05-04 DIAGNOSIS — S81002A Unspecified open wound, left knee, initial encounter: Secondary | ICD-10-CM | POA: Diagnosis not present

## 2024-05-04 HISTORY — PX: IRRIGATION AND DEBRIDEMENT KNEE: SHX5185

## 2024-05-04 LAB — CBC
HCT: 31.4 % — ABNORMAL LOW (ref 36.0–46.0)
Hemoglobin: 10 g/dL — ABNORMAL LOW (ref 12.0–15.0)
MCH: 31.2 pg (ref 26.0–34.0)
MCHC: 31.8 g/dL (ref 30.0–36.0)
MCV: 97.8 fL (ref 80.0–100.0)
Platelets: 298 K/uL (ref 150–400)
RBC: 3.21 MIL/uL — ABNORMAL LOW (ref 3.87–5.11)
RDW: 13.6 % (ref 11.5–15.5)
WBC: 6.5 K/uL (ref 4.0–10.5)
nRBC: 0 % (ref 0.0–0.2)

## 2024-05-04 LAB — BASIC METABOLIC PANEL WITH GFR
Anion gap: 7 (ref 5–15)
BUN: 13 mg/dL (ref 8–23)
CO2: 25 mmol/L (ref 22–32)
Calcium: 8.3 mg/dL — ABNORMAL LOW (ref 8.9–10.3)
Chloride: 107 mmol/L (ref 98–111)
Creatinine, Ser: 0.88 mg/dL (ref 0.44–1.00)
GFR, Estimated: >60 mL/min (ref >60)
Glucose, Bld: 91 mg/dL (ref 70–99)
Potassium: 3.5 mmol/L (ref 3.5–5.1)
Sodium: 139 mmol/L (ref 135–145)

## 2024-05-04 SURGERY — IRRIGATION AND DEBRIDEMENT KNEE
Anesthesia: General | Site: Knee | Laterality: Left

## 2024-05-04 MED ORDER — OXYCODONE HCL 5 MG PO TABS: 5.0000 mg | ORAL_TABLET | Freq: Once | ORAL | Status: DC | PRN

## 2024-05-04 MED ORDER — PHENYLEPHRINE 80 MCG/ML (10ML) SYRINGE FOR IV PUSH (FOR BLOOD PRESSURE SUPPORT)
PREFILLED_SYRINGE | INTRAVENOUS | Status: DC | PRN
Start: 2024-05-04 — End: 2024-05-04
  Administered 2024-05-04: 160 ug via INTRAVENOUS

## 2024-05-04 MED ORDER — VANCOMYCIN HCL 750 MG/150ML IV SOLN
750.0000 mg | Freq: Two times a day (BID) | INTRAVENOUS | Status: DC
  Administered 2024-05-04 – 2024-05-06 (×4): 750 mg via INTRAVENOUS
  Filled 2024-05-04 (×5): qty 150

## 2024-05-04 MED ORDER — CHLORHEXIDINE GLUCONATE 0.12 % MT SOLN: 15.0000 mL | Freq: Once | OROMUCOSAL | Status: AC

## 2024-05-04 MED ORDER — VANCOMYCIN HCL 1000 MG IV SOLR
INTRAVENOUS | Status: AC
  Filled 2024-05-04: qty 20

## 2024-05-04 MED ORDER — METOCLOPRAMIDE HCL 5 MG PO TABS: 5.0000 mg | ORAL_TABLET | Freq: Three times a day (TID) | ORAL | Status: DC | PRN

## 2024-05-04 MED ORDER — FENTANYL CITRATE (PF) 250 MCG/5ML IJ SOLN
INTRAMUSCULAR | Status: AC
  Filled 2024-05-04: qty 5

## 2024-05-04 MED ORDER — TOBRAMYCIN SULFATE 1.2 G IJ SOLR
INTRAMUSCULAR | Status: DC | PRN
  Administered 2024-05-04: 1.2 g via TOPICAL

## 2024-05-04 MED ORDER — POLYETHYLENE GLYCOL 3350 17 G PO PACK: 17.0000 g | PACK | Freq: Every day | ORAL | Status: DC | PRN

## 2024-05-04 MED ORDER — 0.9 % SODIUM CHLORIDE (POUR BTL) OPTIME
TOPICAL | Status: DC | PRN
  Administered 2024-05-04: 1000 mL

## 2024-05-04 MED ORDER — LACTATED RINGERS IV SOLN
INTRAVENOUS | Status: DC
Start: 2024-05-04 — End: 2024-05-04

## 2024-05-04 MED ORDER — ONDANSETRON HCL 4 MG/2ML IJ SOLN
INTRAMUSCULAR | Status: AC
  Filled 2024-05-04: qty 2

## 2024-05-04 MED ORDER — FENTANYL CITRATE (PF) 100 MCG/2ML IJ SOLN
INTRAMUSCULAR | Status: AC
  Filled 2024-05-04: qty 2

## 2024-05-04 MED ORDER — ONDANSETRON HCL 4 MG/2ML IJ SOLN: 4.0000 mg | Freq: Four times a day (QID) | INTRAMUSCULAR | Status: DC | PRN

## 2024-05-04 MED ORDER — LIDOCAINE 2% (20 MG/ML) 5 ML SYRINGE
INTRAMUSCULAR | Status: AC
  Filled 2024-05-04: qty 5

## 2024-05-04 MED ORDER — SODIUM CHLORIDE 0.9 % IV SOLN: INTRAVENOUS | Status: AC

## 2024-05-04 MED ORDER — TOBRAMYCIN SULFATE 1.2 G IJ SOLR
INTRAMUSCULAR | Status: AC
  Filled 2024-05-04: qty 1.2

## 2024-05-04 MED ORDER — ORAL CARE MOUTH RINSE: 15.0000 mL | Freq: Once | OROMUCOSAL | Status: AC

## 2024-05-04 MED ORDER — DOCUSATE SODIUM 100 MG PO CAPS
100.0000 mg | ORAL_CAPSULE | Freq: Two times a day (BID) | ORAL | Status: DC
Start: 2024-05-04 — End: 2024-05-09
  Administered 2024-05-04 – 2024-05-07 (×3): 100 mg via ORAL
  Filled 2024-05-04 (×5): qty 1

## 2024-05-04 MED ORDER — CHLORHEXIDINE GLUCONATE 0.12 % MT SOLN
OROMUCOSAL | Status: AC
  Administered 2024-05-04: 15 mL via OROMUCOSAL
  Filled 2024-05-04: qty 15

## 2024-05-04 MED ORDER — PROPOFOL 10 MG/ML IV BOLUS
INTRAVENOUS | Status: DC | PRN
  Administered 2024-05-04: 150 mg via INTRAVENOUS

## 2024-05-04 MED ORDER — VANCOMYCIN HCL 1000 MG IV SOLR
INTRAVENOUS | Status: DC | PRN
  Administered 2024-05-04: 1000 mg via TOPICAL

## 2024-05-04 MED ORDER — PROPOFOL 10 MG/ML IV BOLUS
INTRAVENOUS | Status: AC
  Filled 2024-05-04: qty 20

## 2024-05-04 MED ORDER — ENOXAPARIN SODIUM 60 MG/0.6ML IJ SOSY
50.0000 mg | PREFILLED_SYRINGE | INTRAMUSCULAR | Status: DC
  Administered 2024-05-06 – 2024-05-11 (×6): 50 mg via SUBCUTANEOUS
  Filled 2024-05-04 (×6): qty 0.6

## 2024-05-04 MED ORDER — SODIUM CHLORIDE 0.9 % IR SOLN
Status: DC | PRN
  Administered 2024-05-04: 3000 mL

## 2024-05-04 MED ORDER — FENTANYL CITRATE (PF) 100 MCG/2ML IJ SOLN
25.0000 ug | INTRAMUSCULAR | Status: DC | PRN
  Administered 2024-05-04 (×2): 50 ug via INTRAVENOUS

## 2024-05-04 MED ORDER — ONDANSETRON HCL 4 MG/2ML IJ SOLN
INTRAMUSCULAR | Status: DC | PRN
  Administered 2024-05-04: 4 mg via INTRAVENOUS

## 2024-05-04 MED ORDER — LIDOCAINE 2% (20 MG/ML) 5 ML SYRINGE
INTRAMUSCULAR | Status: DC | PRN
  Administered 2024-05-04: 100 mg via INTRAVENOUS

## 2024-05-04 MED ORDER — METOCLOPRAMIDE HCL 5 MG/ML IJ SOLN: 5.0000 mg | Freq: Three times a day (TID) | INTRAMUSCULAR | Status: DC | PRN

## 2024-05-04 MED ORDER — DIPHENHYDRAMINE HCL 12.5 MG/5ML PO ELIX: 12.5000 mg | ORAL_SOLUTION | ORAL | Status: DC | PRN

## 2024-05-04 MED ORDER — OXYCODONE HCL 5 MG/5ML PO SOLN: 5.0000 mg | Freq: Once | ORAL | Status: DC | PRN

## 2024-05-04 MED ORDER — FENTANYL CITRATE (PF) 250 MCG/5ML IJ SOLN
INTRAMUSCULAR | Status: DC | PRN
  Administered 2024-05-04 (×2): 50 ug via INTRAVENOUS
  Administered 2024-05-04: 100 ug via INTRAVENOUS

## 2024-05-04 SURGICAL SUPPLY — 46 items
BAG COUNTER SPONGE SURGICOUNT (BAG) ×1 IMPLANT
BNDG COHESIVE 4X5 TAN STRL LF (GAUZE/BANDAGES/DRESSINGS) ×1 IMPLANT
BNDG ELASTIC 4INX 5YD STR LF (GAUZE/BANDAGES/DRESSINGS) IMPLANT
BNDG ELASTIC 6INX 5YD STR LF (GAUZE/BANDAGES/DRESSINGS) IMPLANT
BNDG GAUZE DERMACEA FLUFF 4 (GAUZE/BANDAGES/DRESSINGS) ×2 IMPLANT
BRUSH SCRUB EZ PLAIN DRY (MISCELLANEOUS) ×2 IMPLANT
CHLORAPREP W/TINT 26 (MISCELLANEOUS) ×1 IMPLANT
COVER MAYO STAND STRL (DRAPES) ×1 IMPLANT
COVER SURGICAL LIGHT HANDLE (MISCELLANEOUS) ×2 IMPLANT
DRAPE SURG 17X23 STRL (DRAPES) ×1 IMPLANT
DRAPE SURG ORHT 6 SPLT 77X108 (DRAPES) ×1 IMPLANT
DRAPE U-SHAPE 47X51 STRL (DRAPES) ×1 IMPLANT
DRSG ADAPTIC 3X8 NADH LF (GAUZE/BANDAGES/DRESSINGS) ×1 IMPLANT
DRSG MEPITEL 4X7.2 (GAUZE/BANDAGES/DRESSINGS) IMPLANT
ELECTRODE REM PT RTRN 9FT ADLT (ELECTROSURGICAL) IMPLANT
EVACUATOR 1/8 PVC DRAIN (DRAIN) IMPLANT
GAUZE SPONGE 4X4 12PLY STRL (GAUZE/BANDAGES/DRESSINGS) ×1 IMPLANT
GLOVE BIO SURGEON STRL SZ 6.5 (GLOVE) ×3 IMPLANT
GLOVE BIO SURGEON STRL SZ7.5 (GLOVE) ×4 IMPLANT
GLOVE BIOGEL PI IND STRL 6.5 (GLOVE) ×1 IMPLANT
GLOVE BIOGEL PI IND STRL 7.5 (GLOVE) ×1 IMPLANT
GOWN STRL REUS W/ TWL LRG LVL3 (GOWN DISPOSABLE) ×2 IMPLANT
KIT BASIN OR (CUSTOM PROCEDURE TRAY) ×1 IMPLANT
KIT TURNOVER KIT B (KITS) ×1 IMPLANT
MANIFOLD NEPTUNE II (INSTRUMENTS) ×1 IMPLANT
PACK ORTHO EXTREMITY (CUSTOM PROCEDURE TRAY) ×1 IMPLANT
PAD ARMBOARD POSITIONER FOAM (MISCELLANEOUS) ×2 IMPLANT
PAD CAST 4YDX4 CTTN HI CHSV (CAST SUPPLIES) IMPLANT
PADDING CAST COTTON 6X4 STRL (CAST SUPPLIES) ×1 IMPLANT
SET HNDPC FAN SPRY TIP SCT (DISPOSABLE) IMPLANT
SOLN 0.9% NACL 1000 ML (IV SOLUTION) ×1 IMPLANT
SOLN 0.9% NACL POUR BTL 1000ML (IV SOLUTION) ×1 IMPLANT
SOLN STERILE WATER 1000 ML (IV SOLUTION) ×1 IMPLANT
SOLN STERILE WATER BTL 1000 ML (IV SOLUTION) ×1 IMPLANT
SPONGE T-LAP 18X18 ~~LOC~~+RFID (SPONGE) ×1 IMPLANT
SUT ETHILON 2 0 FS 18 (SUTURE) ×2 IMPLANT
SUT ETHILON 3 0 PS 1 (SUTURE) ×2 IMPLANT
SUT MON AB 2-0 CT1 36 (SUTURE) ×1 IMPLANT
SUT NYLON 3 0 (SUTURE) IMPLANT
SUT PDS AB 0 CT 36 (SUTURE) IMPLANT
SWAB CULTURE ESWAB REG 1ML (MISCELLANEOUS) IMPLANT
TOWEL GREEN STERILE (TOWEL DISPOSABLE) ×2 IMPLANT
TOWEL GREEN STERILE FF (TOWEL DISPOSABLE) ×1 IMPLANT
TUBE CONNECTING 12X1/4 (SUCTIONS) ×1 IMPLANT
UNDERPAD 30X36 HEAVY ABSORB (UNDERPADS AND DIAPERS) ×1 IMPLANT
YANKAUER SUCT BULB TIP NO VENT (SUCTIONS) ×1 IMPLANT

## 2024-05-04 NOTE — Anesthesia Preprocedure Evaluation (Signed)
 Anesthesia Evaluation  Patient identified by MRN, date of birth, ID band Patient awake    Reviewed: Allergy & Precautions, H&P , NPO status , Patient's Chart, lab work & pertinent test results  Airway Mallampati: II   Neck ROM: full    Dental   Pulmonary neg pulmonary ROS   breath sounds clear to auscultation       Cardiovascular hypertension,  Rhythm:regular Rate:Normal     Neuro/Psych    GI/Hepatic ,GERD  ,,  Endo/Other    Renal/GU      Musculoskeletal  (+) Arthritis ,    Abdominal   Peds  Hematology  (+) Blood dyscrasia, anemia Hemoglobin 10.0   Anesthesia Other Findings   Reproductive/Obstetrics                              Anesthesia Physical Anesthesia Plan  ASA: 2  Anesthesia Plan: General   Post-op Pain Management:    Induction: Intravenous  PONV Risk Score and Plan: 3 and Ondansetron , Dexamethasone  and Treatment may vary due to age or medical condition  Airway Management Planned: LMA  Additional Equipment:   Intra-op Plan:   Post-operative Plan:   Informed Consent: I have reviewed the patients History and Physical, chart, labs and discussed the procedure including the risks, benefits and alternatives for the proposed anesthesia with the patient or authorized representative who has indicated his/her understanding and acceptance.     Dental advisory given  Plan Discussed with: CRNA, Anesthesiologist and Surgeon  Anesthesia Plan Comments:         Anesthesia Quick Evaluation

## 2024-05-04 NOTE — Plan of Care (Signed)
  Problem: Nutrition: Goal: Adequate nutrition will be maintained Outcome: Progressing   Problem: Coping: Goal: Level of anxiety will decrease Outcome: Progressing   Problem: Pain Managment: Goal: General experience of comfort will improve and/or be controlled Outcome: Not Progressing

## 2024-05-04 NOTE — Progress Notes (Signed)
 PROGRESS NOTE  Kara Acevedo FMW:[id number] DOB: [birth year] DOA: 05/02/2024 PCP: Renato Dorothey HERO, NP   LOS: 2 days   Brief Narrative / Interim history:  68 y.o. female with medical history significant of attention deficit hyper activity disorder, essential hypertension, history of bariatric surgery, GERD, history of periprosthetic fracture of the femur, osteoarthritis among other things who was admitted and had left total knee replacement on September 3.  Patient went home on the ninth.   Patient started noticing swelling and redness about 3 days ago.  It started a small point but spread across the suture line.  She discussed with her family and the daughter who is a Kara Acevedo rights manager who recommended the patient sees her PCP.  Will later she was evaluated by orthopedics and started on oral clindamycin through the weekend.  Patient has taken the clindamycin but did not feel better instead symptoms have gotten worse.    Subjective / 24h Interval events: Doing well today, appreciates that her surgical site looks less red, but still having a discharge  Assesement and Plan: Principal problem Surgical site infection - Ortho consulted, she was just admitted and discharged on 9/8 with left periprosthetic distal femur fracture, status post repair by Dr. Kendal on 9/3.  She has hardware placed in that area - Continue IV antibiotics, she will be taken to the OR this morning - Consult ID   Active problems  Essential hypertension -hold meds while BP low   GERD - PPIs   ADHD - Adderall in the hospital  Obesity, class II-BMI 38.2.  She would benefit from weight loss  Scheduled Meds:  [MAR Hold] amphetamine -dextroamphetamine   20 mg Oral Daily   chlorhexidine        [MAR Hold] enoxaparin  (LOVENOX ) injection  50 mg Subcutaneous Q24H   [MAR Hold] feeding supplement  237 mL Oral TID BM   [MAR Hold] hydrochlorothiazide   12.5 mg Oral Daily   Continuous Infusions:  [MAR Hold] ceFEPime   (MAXIPIME ) IV 2 g (05/04/24 0524)   [MAR Hold] vancomycin      PRN Meds:.[MAR Hold] acetaminophen , chlorhexidine , [MAR Hold]  HYDROmorphone  (DILAUDID ) injection, [MAR Hold] methocarbamol , [MAR Hold] ondansetron  **OR** [MAR Hold] ondansetron  (ZOFRAN ) IV, [MAR Hold] oxyCODONE   Current Outpatient Medications  Medication Instructions   [Paused] amLODipine  (NORVASC ) 5 mg, Oral, Daily, Please hold this medication if your systolic blood pressure ( top number) is less than 100.   amphetamine -dextroamphetamine  (ADDERALL) 20 MG tablet 20 mg, Daily   apixaban  (ELIQUIS ) 2.5 mg, Oral, 2 times daily   cyanocobalamin  (VITAMIN B12) 1000 MCG/ML injection    docusate sodium  (COLACE) 100 mg, Oral, 2 times daily   Ensure Max Protein (ENSURE MAX PROTEIN) LIQD 11 oz, Oral, 2 times daily   hydrochlorothiazide  (HYDRODIURIL ) 12.5 mg, Oral, Daily   hydrocortisone 2.5 % cream 1 Application, Topical, Daily PRN   ibuprofen  (ADVIL ) 800 mg, Daily PRN   [Paused] indapamide  (LOZOL ) 2.5 mg, Oral, Daily PRN, edema   methocarbamol  (ROBAXIN ) 500 mg, Oral, Every 6 hours PRN   Multiple Vitamin (MULTIVITAMIN WITH MINERALS) TABS tablet 1 tablet, Oral, Daily   ondansetron  (ZOFRAN ) 4 mg, Daily PRN   oxyCODONE  (OXY IR/ROXICODONE ) 2.5-5 mg, Oral, Every 4 hours PRN   potassium chloride  SA (KLOR-CON  M) 20 MEQ tablet 20 mEq, 3 times daily   Vitamin D  (Ergocalciferol ) (DRISDOL ) 50,000 Units, Oral, Every 7 days   Zepbound 2.5 mg, Weekly    Diet Orders (From admission, onward)     Start     Ordered  05/04/24 0001  Diet NPO time specified  Diet effective midnight        05/03/24 0841            DVT prophylaxis:    Lab Results  Component Value Date   PLT 298 05/04/2024      Code Status: Full Code  Family Communication: No family at bedside  Status is: Inpatient Remains inpatient appropriate because: Severity of illness  Level of care: Med-Surg  Consultants:  Orthopedic surgery ID  Objective: Vitals:    05/03/24 2000 05/03/24 2150 05/04/24 0541 05/04/24 0828  BP: (!) 109/52 104/60 (!) 102/58 (!) 115/59  Pulse:  76 70 70  Resp:   16 20  Temp:  98.8 F (37.1 C) 98.3 F (36.8 C) 98.9 F (37.2 C)  TempSrc:  Oral Oral Oral  SpO2:  96% 95% 99%  Weight:    104.3 kg  Height:    5' 5 (1.651 m)    Intake/Output Summary (Last 24 hours) at 05/04/2024 0834 Last data filed at 05/04/2024 0303 Gross per 24 hour  Intake 1128 ml  Output 250 ml  Net 878 ml   Wt Readings from Last 3 Encounters:  05/04/24 104.3 kg  04/13/24 97.5 kg  02/23/22 96.9 kg    Examination:  Constitutional: NAD Eyes: no scleral icterus ENMT: Mucous membranes are moist.  Neck: normal, supple Respiratory: clear to auscultation bilaterally, no wheezing, no crackles.  Cardiovascular: Regular rate and rhythm, no murmurs / rubs / gallops. No LE edema.  Abdomen: non distended, no tenderness. Bowel sounds positive.  Musculoskeletal: no clubbing / cyanosis.   Data Reviewed: I have independently reviewed following labs and imaging studies   CBC Recent Labs  Lab 05/02/24 1254 05/02/24 1304 05/03/24 0327 05/04/24 0239  WBC 9.0  --  6.5 6.5  HGB 11.3* 11.2* 10.6* 10.0*  HCT 35.1* 33.0* 32.3* 31.4*  PLT 380  --  311 298  MCV 95.9  --  96.4 97.8  MCH 30.9  --  31.6 31.2  MCHC 32.2  --  32.8 31.8  RDW 13.8  --  13.7 13.6  LYMPHSABS 1.0  --   --   --   MONOABS 0.9  --   --   --   EOSABS 0.1  --   --   --   BASOSABS 0.0  --   --   --     Recent Labs  Lab 05/02/24 1254 05/02/24 1304 05/03/24 0327 05/04/24 0239  NA 136 137 138 139  K 3.6 3.8 3.6 3.5  CL 103 103 105 107  CO2 23  --  26 25  GLUCOSE 117* 122* 115* 91  BUN 18 21 13 13   CREATININE 0.82 0.80 0.77 0.88  CALCIUM 8.3*  --  8.3* 8.3*  AST 22  --  22  --   ALT 14  --  14  --   ALKPHOS 89  --  74  --   BILITOT 1.0  --  0.9  --   ALBUMIN  3.0*  --  2.5*  --   LATICACIDVEN  --  0.6  --   --      ------------------------------------------------------------------------------------------------------------------ No results for input(s): CHOL, HDL, LDLCALC, TRIG, CHOLHDL, LDLDIRECT in the last 72 hours.  No results found for: HGBA1C ------------------------------------------------------------------------------------------------------------------ No results for input(s): TSH, T4TOTAL, T3FREE, THYROIDAB in the last 72 hours.  Invalid input(s): FREET3  Cardiac Enzymes No results for input(s): CKMB, TROPONINI, MYOGLOBIN in the last 168 hours.  Invalid input(s): CK ------------------------------------------------------------------------------------------------------------------ No results found for: BNP  CBG: No results for input(s): GLUCAP in the last 168 hours.  Recent Results (from the past 240 hours)  Blood culture (routine x 2)     Status: None (Preliminary result)   Collection Time: 05/02/24  5:40 PM   Specimen: BLOOD  Result Value Ref Range Status   Specimen Description BLOOD LEFT ANTECUBITAL  Final   Special Requests   Final    BOTTLES DRAWN AEROBIC AND ANAEROBIC Blood Culture adequate volume   Culture   Final    NO GROWTH 2 DAYS Performed at [facility], [address] [place], [place] [address]    Report Status PENDING  Incomplete  Blood culture (routine x 2)     Status: None (Preliminary result)   Collection Time: 05/02/24  6:40 PM   Specimen: BLOOD  Result Value Ref Range Status   Specimen Description BLOOD RIGHT ANTECUBITAL  Final   Special Requests   Final    BOTTLES DRAWN AEROBIC AND ANAEROBIC Blood Culture results may not be optimal due to an inadequate volume of blood received in culture bottles   Culture   Final    NO GROWTH 2 DAYS Performed at [facility], [address], [place], [place] [address]    Report Status PENDING  Incomplete     Radiology Studies: No results found.   Nilda Fendt,  MD, PhD [facility]  Between 7 am - 7 pm I am available, please contact me via Amion (for emergencies) or Securechat (non urgent messages)  Between 7 pm - 7 am I am not available, please contact night coverage MD/APP via Amion

## 2024-05-04 NOTE — Plan of Care (Signed)

## 2024-05-04 NOTE — Progress Notes (Signed)
 Pharmacy Antibiotic Note  Kara Acevedo is a 68 y.o. female admitted on 05/02/2024 with surgical site infection.  Pharmacy has been consulted for vancomycin  and cefepime  dosing.  Plan: Cefepime  2g IV q8h Change Vancomycin  750 mg iv q12h. eAUC 440, Tss 12.9, Vd 0.5, scr 0.88 Follow up renal function, cultures as available, clinical progress, length of tx  Height: 5' 5 (165.1 cm) Weight: 104.3 kg (229 lb 15 oz) IBW/kg (Calculated) : 57  Temp (24hrs), Avg:99 F (37.2 C), Min:98.3 F (36.8 C), Max:99.6 F (37.6 C)  Recent Labs  Lab 05/02/24 1254 05/02/24 1304 05/03/24 0327 05/04/24 0239  WBC 9.0  --  6.5 6.5  CREATININE 0.82 0.80 0.77 0.88  LATICACIDVEN  --  0.6  --   --     Estimated Creatinine Clearance: 73.3 mL/min (by C-G formula based on SCr of 0.88 mg/dL).    Allergies  Allergen Reactions   Ace Inhibitors Other (See Comments) and Cough    Uvula swelling   Doxycycline Nausea And Vomiting   Latex Other (See Comments)    Blisters    Sulfa Antibiotics Rash   Fluconazole Other (See Comments)    Feels like a burning sensation from inside out   Nickel Swelling and Dermatitis    Blistering    Antimicrobials this admission: Ceftriaxone  x 1 9/22 Vancomycin  9/22 >> Cefepime  9/22 >>  Dose adjustments this admission:  Microbiology results: 9/22 BCx: [id number]  Thank you for allowing pharmacy to be a part of this patient's care.  Benedetta Heath BS, PharmD, BCPS Clinical Pharmacist 05/04/2024 8:16 AM  Contact: [phone] after 3 PM

## 2024-05-04 NOTE — Op Note (Signed)
 Orthopaedic Surgery Operative Note (CSN: [id number] ) Date of Surgery: 05/04/2024  Admit Date: 05/02/2024   Diagnoses: Pre-Op Diagnoses: Left supracondylar distal femur fracture s/p ORIF Left surgical incision drainage  Post-Op Diagnosis: Same  Procedures: CPT 27301-Incision and drainage of left knee seroma/infected hematoma CPT 10180-Irrigation and debridement of left knee postoperative infection  Surgeons : Primary: Kendal Franky SQUIBB, MD  Assistant: Lauraine Moores, PA-C  Location: OR 3   Anesthesia: General   Antibiotics: Scheduled broad spectrum antibiotics, 1 gm vancomycin  and 1.2 gm tobramycin  powder placed topically   Tourniquet time: None    Estimated Blood Loss: Minimal  Complications:* No complications entered in OR log *   Specimens: ID Type Source Tests Collected by Time Destination  A : left knee infection Tissue Leg, Left AEROBIC/ANAEROBIC CULTURE W GRAM STAIN (SURGICAL/DEEP WOUND) Kensly Bowmer, Franky SQUIBB, MD 05/04/2024 [phone]   B : left knee infection Tissue Leg, Left AEROBIC/ANAEROBIC CULTURE W GRAM STAIN (SURGICAL/DEEP WOUND) Kendal Franky SQUIBB, MD 05/04/2024 [phone]      Implants: * No implants in log *   Indications for Surgery: 68 year old female who underwent open reduction internal fixation of left supracondylar distal femur fracture on 04/13/2024.  Patient presented with erythema and drainage from her wound on Monday.  She was admitted for IV antibiotics.  Her drainage persisted and I recommended proceeding to the operating room for irrigation debridement of her wound.  Risks and benefits were discussed.  Risks included but not limited to bleeding, persistent infection, need for long-term antibiotics, nonunion, even the possibility anesthetic complications.  The patient agreed to proceed with surgery and consent was obtained.  Operative Findings: 1.  Postoperative infection with infected seroma/hematoma treated with irrigation and debridement.  The collection went all the  way down to the plate and screws. 2.  Placement of a gram of vancomycin  powder and 1.2 g of tobramycin  powder with primary closure of the wound.  Procedure: The patient was identified in the preoperative holding area. Consent was confirmed with the patient and their family and all questions were answered. The operative extremity was marked after confirmation with the patient. she was then brought back to the operating room by our anesthesia colleagues.  She was carefully transferred over to radiolucent flattop table.  She was placed under general anesthetic.  The left lower extremity was then prepped and draped in usual sterile fashion.  A timeout was performed to verify the patient, the procedure, and the extremity.  Preoperative antibiotics were not dosed secondary to being on on broad-spectrum antibiotics.  The incision was reopened and I encountered murky serosanguineous fluid that extended into the subcutaneous tissue all the way down below the IT band.  I incised through the IT band and removed the sutures that were in place.  I sent cultures of this fluid to the lab.  There was no gross pus and no foul-smelling fluid.  There was a small metallic foreign body in the soft tissues that I believe was from a portion of the jig from the initial surgery that had broken off that was unrecognized.  I used fluoroscopy to identify it and remove it.  This was done successfully.  I then proceeded to irrigate the wound with normal saline using low-pressure pulsatile lavage.  Approximately 3 L was used.  The wound was completely clean gloves were changed and instruments were changed.  I then placed a gram of vancomycin  powder and 1.2 g of tobramycin  powder into the wound.  Layered closure of 0 PDS  for the IT band 2-0 Monocryl for the skin and 3-0 nylon for the skin was used to close the incision.  Sterile dressings were applied.  The patient was then awoken from anesthesia and taken to the PACU in stable condition.    Debridement type: Excisional Debridement  Side: left  Body Location: Knee   Tools used for debridement: scalpel and rongeur  Pre-debridement Wound size (cm):   N/A  Post-debridement Wound size (cm):   N/A-closed  Debridement depth beyond dead/damaged tissue down to healthy viable tissue: yes  Tissue layer involved: skin, subcutaneous tissue, muscle / fascia  Nature of tissue removed: Slough and Non-viable tissue  Irrigation volume: 3L     Irrigation fluid type: Normal Saline   Post Op Plan/Instructions: Patient will be weightbearing as tolerated postoperatively.  Will continue broad-spectrum antibiotics.  Will follow-up the cultures and adjust antibiotic regimen appropriately.  We may have to consult infectious disease to assist with antibiotic regiment.  I would recommend holding anticoagulation for at least 48 hours postoperatively.  I was present and performed the entire surgery.  Lauraine Moores, PA-C did assist me throughout the case. An assistant was necessary given the difficulty in approach, maintenance of reduction and ability to instrument the fracture.   Franky Light, MD [facility]

## 2024-05-04 NOTE — Progress Notes (Signed)
 Ortho Trauma Note  Has been on broad-spectrum antibiotics for the last nearly 48 hours.  Unfortunately the wound still has persistent drainage.  The surrounding erythema is improved however there is a central area that has some dehiscence.  In light of the continued drainage even on antibiotics I recommend that we proceed to the operating room for I&D and intraoperative cultures.  Risks and benefits are discussed with the patient.  She understands and agrees to proceed.  Her weightbearing status will not change postoperatively.  Will likely adjust her antibiotics based on the intraoperative cultures.  They may show no growth due to the broad-spectrum nature of her recent antibiotic use.  [name], MD [facility] [phone] (office) [email or web address]

## 2024-05-04 NOTE — Transfer of Care (Signed)
 Immediate Anesthesia Transfer of Care Note  Patient: Kara Acevedo  Procedure(s) Performed: IRRIGATION AND DEBRIDEMENT KNEE (Left: Knee)  Patient Location: PACU  Anesthesia Type:General  Level of Consciousness: awake, alert , oriented, and patient cooperative  Airway & Oxygen Therapy: Patient Spontanous Breathing  Post-op Assessment: Report given to RN and Post -op Vital signs reviewed and stable  Post vital signs: Reviewed and stable  Last Vitals:  Vitals Value Taken Time  BP 108/62 05/04/24 10:16  Temp    Pulse 66 05/04/24 10:18  Resp 9 05/04/24 10:18  SpO2 95 % 05/04/24 10:18  Vitals shown include unfiled device data.  Last Pain:  Vitals:   05/04/24 0831  TempSrc:   PainSc: 0-No pain         Complications: No notable events documented.

## 2024-05-04 NOTE — Anesthesia Procedure Notes (Signed)
 Procedure Name: LMA Insertion Date/Time: 05/04/2024 9:11 AM  Performed by: Marva Lonni PARAS, CRNAPre-anesthesia Checklist: Patient identified, Emergency Drugs available, Suction available and Patient being monitored Patient Re-evaluated:Patient Re-evaluated prior to induction Oxygen Delivery Method: Circle System Utilized Preoxygenation: Pre-oxygenation with 100% oxygen Induction Type: IV induction Ventilation: Mask ventilation without difficulty LMA: LMA inserted LMA Size: 4.0 Number of attempts: 1 Airway Equipment and Method: Bite block Placement Confirmation: positive ETCO2 Tube secured with: Tape Dental Injury: Teeth and Oropharynx as per pre-operative assessment

## 2024-05-04 NOTE — Plan of Care (Signed)
 Id consulted for long term abx for hardware associated om  Patient in OR and will formally see in AM Continue empiric abx

## 2024-05-04 NOTE — H&P (View-Only) (Signed)
 Ortho Trauma Note  Has been on broad-spectrum antibiotics for the last nearly 48 hours.  Unfortunately the wound still has persistent drainage.  The surrounding erythema is improved however there is a central area that has some dehiscence.  In light of the continued drainage even on antibiotics I recommend that we proceed to the operating room for I&D and intraoperative cultures.  Risks and benefits are discussed with the patient.  She understands and agrees to proceed.  Her weightbearing status will not change postoperatively.  Will likely adjust her antibiotics based on the intraoperative cultures.  They may show no growth due to the broad-spectrum nature of her recent antibiotic use.  [name], MD [facility] [phone] (office) [email or web address]

## 2024-05-04 NOTE — Interval H&P Note (Signed)
 History and Physical Interval Note:  05/04/2024 8:23 AM  Kara Acevedo  has presented today for surgery, with the diagnosis of LEft knee wound.  The various methods of treatment have been discussed with the patient and family. After consideration of risks, benefits and other options for treatment, the patient has consented to  Procedure(s): IRRIGATION AND DEBRIDEMENT KNEE (Left) as a surgical intervention.  The patient's history has been reviewed, patient examined, no change in status, stable for surgery.  I have reviewed the patient's chart and labs.  Questions were answered to the patient's satisfaction.     Davonta Stroot P Catherine Cubero

## 2024-05-04 NOTE — Progress Notes (Signed)
   05/04/24 1606  Assess: MEWS Score  BP (!) 105/44  Pulse Rate 70  Level of Consciousness Alert  Assess: MEWS Score  MEWS Temp 0  MEWS Systolic 0  MEWS Pulse 0  MEWS RR 0  MEWS LOC 0  MEWS Score 0  MEWS Score Color Green  Assess: if the MEWS score is Yellow or Red  Were vital signs accurate and taken at a resting state? Yes  Does the patient meet 2 or more of the SIRS criteria? No  Notify: Charge Nurse/RN  Name of Charge Nurse/RN Notified Eleanore MATSU, RN  Assess: SIRS CRITERIA  SIRS Temperature  0  SIRS Respirations  0  SIRS Pulse 0  SIRS WBC 0  SIRS Score Sum  0   Recheck bp, pt is alert and resting in bed.

## 2024-05-05 ENCOUNTER — Encounter (HOSPITAL_COMMUNITY): Payer: Self-pay | Admitting: Student

## 2024-05-05 DIAGNOSIS — T8142XD Infection following a procedure, deep incisional surgical site, subsequent encounter: Secondary | ICD-10-CM | POA: Diagnosis not present

## 2024-05-05 DIAGNOSIS — L7632 Postprocedural hematoma of skin and subcutaneous tissue following other procedure: Secondary | ICD-10-CM | POA: Diagnosis not present

## 2024-05-05 DIAGNOSIS — T84621A Infection and inflammatory reaction due to internal fixation device of left femur, initial encounter: Secondary | ICD-10-CM

## 2024-05-05 LAB — COMPREHENSIVE METABOLIC PANEL WITH GFR
ALT: 28 U/L (ref 0–44)
AST: 41 U/L (ref 15–41)
Albumin: 2.1 g/dL — ABNORMAL LOW (ref 3.5–5.0)
Alkaline Phosphatase: 78 U/L (ref 38–126)
Anion gap: 6 (ref 5–15)
BUN: 11 mg/dL (ref 8–23)
CO2: 22 mmol/L (ref 22–32)
Calcium: 7.9 mg/dL — ABNORMAL LOW (ref 8.9–10.3)
Chloride: 106 mmol/L (ref 98–111)
Creatinine, Ser: 0.86 mg/dL (ref 0.44–1.00)
GFR, Estimated: >60 mL/min (ref >60)
Glucose, Bld: 108 mg/dL — ABNORMAL HIGH (ref 70–99)
Potassium: 3.6 mmol/L (ref 3.5–5.1)
Sodium: 134 mmol/L — ABNORMAL LOW (ref 135–145)
Total Bilirubin: 0.9 mg/dL (ref 0.0–1.2)
Total Protein: 5.3 g/dL — ABNORMAL LOW (ref 6.5–8.1)

## 2024-05-05 LAB — CBC
HCT: 31.3 % — ABNORMAL LOW (ref 36.0–46.0)
Hemoglobin: 9.9 g/dL — ABNORMAL LOW (ref 12.0–15.0)
MCH: 30.8 pg (ref 26.0–34.0)
MCHC: 31.6 g/dL (ref 30.0–36.0)
MCV: 97.5 fL (ref 80.0–100.0)
Platelets: 284 K/uL (ref 150–400)
RBC: 3.21 MIL/uL — ABNORMAL LOW (ref 3.87–5.11)
RDW: 13.6 % (ref 11.5–15.5)
WBC: 7.3 K/uL (ref 4.0–10.5)
nRBC: 0 % (ref 0.0–0.2)

## 2024-05-05 LAB — MAGNESIUM: Magnesium: 1.7 mg/dL (ref 1.7–2.4)

## 2024-05-05 MED ORDER — SODIUM CHLORIDE 0.9 % IV SOLN
2.0000 g | INTRAVENOUS | Status: DC
  Administered 2024-05-05: 2 g via INTRAVENOUS
  Filled 2024-05-05: qty 20

## 2024-05-05 NOTE — Consult Note (Signed)
 [facility]    Date of Admission:  05/02/2024     Total days of antibiotics 4               Reason for Consult: Post-op Wound Infection  Referring Provider: Dr. Trixie  Primary Care Provider: Renato Dorothey HERO, NP   ASSESSMENT:  [name] is a 68 year old Caucasian female with recent history of periprosthetic distal femur fracture requiring ORIF presenting with erythema and drainage from her surgical wound and found to have postoperative infected hematoma/seroma and now status post debridement.  Surgical specimens with no organisms on Gram stain and cultures pending.  Blood cultures without growth.  Continue broad-spectrum coverage with vancomycin  and change cefepime  to ceftriaxone .  Therapeutic drug monitoring of renal function and vancomycin  levels.  Postoperative wound care per orthopedics.  Anticipate long course of antibiotics given likely hardware infection. Will need to clarify if concern for prosthetic knee joint infection or was outside joint.    Standard/universal precautions.  Remaining medical and supportive care per internal medicine.  PLAN:  Continue broad-spectrum coverage with vancomycin  and cefepime . Therapeutic drug monitoring of renal function and vancomycin  levels per protocol. Monitor surgical specimens and blood cultures for organisms and narrow antibiotics as appropriate. Postsurgical wound care per orthopedics. Standard/universal precautions. Remaining medical and supportive care per internal medicine.   Principal Problem:   Post-operative infection Active Problems:   ADHD   Essential hypertension   History of bariatric surgery   Esophageal reflux    amphetamine -dextroamphetamine   20 mg Oral Daily   docusate sodium   100 mg Oral BID   [START ON 05/06/2024] enoxaparin  (LOVENOX ) injection  50 mg Subcutaneous Q24H   feeding supplement  237 mL Oral TID BM     HPI: Kara Acevedo is a 68 y.o. female  with previous medical history of ADHD, hypertension, status post bariatric surgery, and recent left total knee replacement presenting for erythema and drainage from her surgical wound.  [name] was recently hospitalized on 04/12/2024 with left periprosthetic distal femur fracture following a mechanical fall requiring open reduction and internal fixation.  Now presenting with erythema and drainage from her wound with orthopedics recommending irrigation and debridement of the wound. Initially afebrile with no leukocytosis.  Brought to the OR on 05/04/2024 with findings of postoperative infection with infected seroma/hematoma with collection going all the way down to the plates and screws.  Surgical specimens with no organisms on Gram stain and cultures pending. Blood cultures are without growth.  Started on broad-spectrum antibiotics with vancomycin  and cefepime .  [name] is POD #1 from debridement and tolerating antibiotics with no adverse side effects. Was on clindamycin prior to admission.    Review of Systems: Review of Systems  Constitutional:  Negative for chills, fever and weight loss.  Respiratory:  Negative for cough, shortness of breath and wheezing.   Cardiovascular:  Negative for chest pain and leg swelling.  Gastrointestinal:  Negative for abdominal pain, constipation, diarrhea, nausea and vomiting.  Skin:  Negative for rash.     Past Medical History:  Diagnosis Date   Arthritis    Hypertension     Social History   Tobacco Use   Smoking status: Never   Smokeless tobacco: Never  Substance Use Topics   Alcohol  use: No   Drug use: No    Family History  Family history unknown: Yes    Allergies  Allergen Reactions   Ace Inhibitors Other (See Comments) and Cough  Uvula swelling   Doxycycline Nausea And Vomiting   Latex Other (See Comments)    Blisters    Sulfa Antibiotics Rash   Fluconazole Other (See Comments)    Feels like a burning  sensation from inside out   Nickel Swelling and Dermatitis    Blistering    OBJECTIVE: Blood pressure (!) 95/55, pulse 70, temperature 98.3 F (36.8 C), temperature source Oral, resp. rate 18, height 5' 5 (1.651 m), weight 104.3 kg, SpO2 95%.  Physical Exam Constitutional:      General: She is not in acute distress.    Appearance: She is well-developed.  Cardiovascular:     Rate and Rhythm: Normal rate and regular rhythm.     Heart sounds: Normal heart sounds.  Pulmonary:     Effort: Pulmonary effort is normal.     Breath sounds: Normal breath sounds.  Musculoskeletal:     Comments: Surgical dressing in place.   Skin:    General: Skin is warm and dry.  Neurological:     Mental Status: She is alert.     Lab Results Lab Results  Component Value Date   WBC 7.3 05/05/2024   HGB 9.9 (L) 05/05/2024   HCT 31.3 (L) 05/05/2024   MCV 97.5 05/05/2024   PLT 284 05/05/2024    Lab Results  Component Value Date   CREATININE 0.86 05/05/2024   BUN 11 05/05/2024   NA 134 (L) 05/05/2024   K 3.6 05/05/2024   CL 106 05/05/2024   CO2 22 05/05/2024    Lab Results  Component Value Date   ALT 28 05/05/2024   AST 41 05/05/2024   ALKPHOS 78 05/05/2024   BILITOT 0.9 05/05/2024     Microbiology: Recent Results (from the past 240 hours)  Blood culture (routine x 2)     Status: None (Preliminary result)   Collection Time: 05/02/24  5:40 PM   Specimen: BLOOD  Result Value Ref Range Status   Specimen Description BLOOD LEFT ANTECUBITAL  Final   Special Requests   Final    BOTTLES DRAWN AEROBIC AND ANAEROBIC Blood Culture adequate volume   Culture   Final    NO GROWTH 3 DAYS Performed at [facility], [address] [place], [place] [address]    Report Status PENDING  Incomplete  Blood culture (routine x 2)     Status: None (Preliminary result)   Collection Time: 05/02/24  6:40 PM   Specimen: BLOOD  Result Value Ref Range Status   Specimen Description BLOOD RIGHT  ANTECUBITAL  Final   Special Requests   Final    BOTTLES DRAWN AEROBIC AND ANAEROBIC Blood Culture results may not be optimal due to an inadequate volume of blood received in culture bottles   Culture   Final    NO GROWTH 3 DAYS Performed at [facility], [address] [place], [place] [address]    Report Status PENDING  Incomplete  Aerobic/Anaerobic Culture w Gram Stain (surgical/deep wound)     Status: None (Preliminary result)   Collection Time: 05/04/24  9:25 AM   Specimen: Leg, Left; Tissue  Result Value Ref Range Status   Specimen Description TISSUE  Final   Special Requests LEFT KNEE INFECTION  Final   Gram Stain   Final    ABUNDANT WBC PRESENT, PREDOMINANTLY PMN NO ORGANISMS SEEN    Culture   Final    CULTURE REINCUBATED FOR BETTER GROWTH Performed at [facility], [address], [place], [place] [address]    Report Status PENDING  Incomplete  Aerobic/Anaerobic Culture w Gram Stain (surgical/deep wound)     Status: None (Preliminary result)   Collection Time: 05/04/24  9:38 AM   Specimen: Leg, Left; Tissue  Result Value Ref Range Status   Specimen Description TISSUE  Final   Special Requests LEFT KNEE INFECTION  Final   Gram Stain   Final    ABUNDANT WBC PRESENT, PREDOMINANTLY PMN NO ORGANISMS SEEN    Culture   Final    CULTURE REINCUBATED FOR BETTER GROWTH Performed at [facility], [address], [place], [place] [address]    Report Status PENDING  Incomplete     Cathlyn July, NP [facility]  05/05/2024  10:20 AM

## 2024-05-05 NOTE — Progress Notes (Signed)
 Physical Therapy Treatment Patient Details Name: Kara Acevedo MRN: [id number] DOB: [birth year] Today's Date: 05/05/2024   History of Present Illness Kara Acevedo is an 68 y.o. female who presents on 05/02/24 with swelling and redness L knee surgical site incision.  I & D of L knee 05/04/24. PMHx: HTN, ADHD, bariatric surgery, GERD, arthritis, R TKA, L TKA,  left hip IM nail (02/24/22), L periprosthetic distal femur fx with ORIF 04/13/24    PT Comments  Pt resting in bed on arrival, agreeable to session, however limited in weight bearing and standing tolerance due to RLE pain despite pre-medication. Pt requiring min A to manage RLE to come to sitting EOB. Pt able to perform transfers and short in room ambulation with RW for support and grossly CGA for safety with distance and tolerance limited by pain. Continued education on importance of mobility and gentle ROM of knee. Pt continues to benefit from skilled PT services to progress toward functional mobility goals.     If plan is discharge home, recommend the following: Assistance with cooking/housework;Assist for transportation;Help with stairs or ramp for entrance;A little help with bathing/dressing/bathroom;A little help with walking and/or transfers   Can travel by private vehicle     Yes  Equipment Recommendations  None recommended by PT    Recommendations for Other Services       Precautions / Restrictions Precautions Precautions: Fall Recall of Precautions/Restrictions: Intact Restrictions Weight Bearing Restrictions Per Provider Order: No LLE Weight Bearing Per Provider Order: Weight bearing as tolerated     Mobility  Bed Mobility Overal bed mobility: Needs Assistance Bed Mobility: Supine to Sit, Sit to Supine     Supine to sit: Min assist Sit to supine: Contact guard assist   General bed mobility comments: HOB up, increased time, aware of how to use gait belt as leg lifter, min A to manage RLE  to and off EOB    Transfers Overall transfer level: Needs assistance Equipment used: Rolling walker (2 wheels) Transfers: Sit to/from Stand, Bed to chair/wheelchair/BSC Sit to Stand: Contact guard assist   Step pivot transfers: Contact guard assist       General transfer comment: CGA for safety, good hand placement and steady rise    Ambulation/Gait Ambulation/Gait assistance: Contact guard assist Gait Distance (Feet): 18 Feet Assistive device: Rolling walker (2 wheels) Gait Pattern/deviations: Decreased stance time - left, Decreased weight shift to left, Decreased stride length, Decreased step length - right, Decreased step length - left, Step-to pattern Gait velocity: decr     General Gait Details: pt not tolerating full wt on LLE, unable to get L heel to floor due to pain. Taking wt through UE's on RW to tolerate pain and be able to mobilize, pt insistent on mobilizing without socks   Stairs             Wheelchair Mobility     Tilt Bed    Modified Rankin (Stroke Patients Only)       Balance Overall balance assessment: Needs assistance Sitting-balance support: No upper extremity supported, Feet supported Sitting balance-Leahy Scale: Good     Standing balance support: Single extremity supported, During functional activity Standing balance-Leahy Scale: Poor Standing balance comment: needs at least unilateral UE support due to inability to put full wt through LLE                            Communication Communication Communication: No apparent difficulties  Cognition Arousal: Alert Behavior During Therapy: WFL for tasks assessed/performed                             Following commands: Intact      Cueing Cueing Techniques: Verbal cues  Exercises      General Comments        Pertinent Vitals/Pain Pain Assessment Pain Assessment: Faces Faces Pain Scale: Hurts even more Pain Location: L knee Pain Descriptors /  Indicators: Discomfort, Grimacing Pain Intervention(s): Premedicated before session, Monitored during session, Limited activity within patient's tolerance    Home Living                          Prior Function            PT Goals (current goals can now be found in the care plan section) Acute Rehab PT Goals PT Goal Formulation: With patient Time For Goal Achievement: 05/17/24 Progress towards PT goals: Progressing toward goals    Frequency    Min 2X/week      PT Plan      Co-evaluation              AM-PAC PT 6 Clicks Mobility   Outcome Measure  Help needed turning from your back to your side while in a flat bed without using bedrails?: None Help needed moving from lying on your back to sitting on the side of a flat bed without using bedrails?: None Help needed moving to and from a bed to a chair (including a wheelchair)?: A Little Help needed standing up from a chair using your arms (e.g., wheelchair or bedside chair)?: A Little Help needed to walk in hospital room?: A Lot (<40') Help needed climbing 3-5 steps with a railing? : A Lot 6 Click Score: 18    End of Session   Activity Tolerance: Patient tolerated treatment well;Other (comment) Patient left: in bed;with call bell/phone within reach Nurse Communication: Mobility status PT Visit Diagnosis: Pain;History of falling (Z91.81);Difficulty in walking, not elsewhere classified (R26.2) Pain - Right/Left: Left Pain - part of body: Leg     Time: 8475-8461 PT Time Calculation (min) (ACUTE ONLY): 14 min  Charges:    $Therapeutic Activity: 8-22 mins PT General Charges $$ ACUTE PT VISIT: 1 Visit                     Kara Acevedo R. PTA Acute Rehabilitation Services Office: [phone]   Kara Acevedo 05/05/2024, 4:05 PM

## 2024-05-05 NOTE — Evaluation (Signed)
 Occupational Therapy Evaluation Patient Details Name: Kara Acevedo MRN: [id number] DOB: Oct 02, 1955 Today's Date: 05/05/2024   History of Present Illness   Kara Acevedo is an 68 y.o. female who presents on 05/02/24 with swelling and redness L knee surgical site incision.  I & D of L knee 05/04/24. PMHx: HTN, ADHD, bariatric surgery, GERD, arthritis, R TKA, L TKA,  left hip IM nail (02/24/22), L periprosthetic distal femur fx with ORIF 04/13/24     Clinical Impressions Pt was functioning modified independently prior to readmission using a RW. Limited session as pt experiencing nausea she attributes to pain medications. Pt plans to return home with her family's assist. She has a walk in shower with shower seat with arms on it. She plans to avoid pants and socks and wear slip on shoes. Will follow acutely. Recommending HHOT, depending on progress.      If plan is discharge home, recommend the following:   A little help with walking and/or transfers;Assistance with cooking/housework;Assist for transportation;Help with stairs or ramp for entrance;A little help with bathing/dressing/bathroom     Functional Status Assessment   Patient has had a recent decline in their functional status and demonstrates the ability to make significant improvements in function in a reasonable and predictable amount of time.     Equipment Recommendations   None recommended by OT     Recommendations for Other Services         Precautions/Restrictions   Precautions Precautions: Fall Recall of Precautions/Restrictions: Intact Restrictions Weight Bearing Restrictions Per Provider Order: No LLE Weight Bearing Per Provider Order: Weight bearing as tolerated     Mobility Bed Mobility Overal bed mobility: Modified Independent             General bed mobility comments: HOB up, increased time, aware of how to use gait belt as leg lifter    Transfers                    General transfer comment: deferred, pt with nausea      Balance                                           ADL either performed or assessed with clinical judgement   ADL Overall ADL's : Needs assistance/impaired Eating/Feeding: Independent;Bed level   Grooming: Set up;Sitting   Upper Body Bathing: Set up;Sitting   Lower Body Bathing: Minimal assistance;Sit to/from stand;Sitting/lateral leans   Upper Body Dressing : Set up;Sitting   Lower Body Dressing: Minimal assistance;Sitting/lateral leans                       Vision Baseline Vision/History: 1 Wears glasses Ability to See in Adequate Light: 0 Adequate Patient Visual Report: No change from baseline       Perception         Praxis         Pertinent Vitals/Pain Pain Assessment Pain Assessment: Faces Faces Pain Scale: Hurts even more Pain Location: L knee Pain Descriptors / Indicators: Discomfort, Grimacing Pain Intervention(s): Premedicated before session, Ice applied     Extremity/Trunk Assessment Upper Extremity Assessment Upper Extremity Assessment: Overall WFL for tasks assessed;Right hand dominant   Lower Extremity Assessment Lower Extremity Assessment: Defer to PT evaluation   Cervical / Trunk Assessment Cervical / Trunk Assessment: Normal   Communication Communication Communication: No apparent  difficulties   Cognition Arousal: Alert Behavior During Therapy: WFL for tasks assessed/performed Cognition: No apparent impairments                               Following commands: Intact       Cueing  General Comments   Cueing Techniques: Verbal cues      Exercises     Shoulder Instructions      Home Living Family/patient expects to be discharged to:: Private residence Living Arrangements: Children;Other relatives Available Help at Discharge: Family;Available PRN/intermittently Type of Home: House Home Access: Level entry     Home  Layout: Two level;Able to live on main level with bedroom/bathroom Alternate Level Stairs-Number of Steps: flight   Bathroom Shower/Tub: Producer, television/film/video: Handicapped height Bathroom Accessibility: Yes How Accessible: Accessible via walker Home Equipment: Agricultural consultant (2 wheels);Shower seat;BSC/3in1   Additional Comments: pt's daughter and family live with her. Pt stays in the basement with level entry. Has 2 dogs      Prior Functioning/Environment Prior Level of Function : History of Falls (last six months);Independent/Modified Independent             Mobility Comments: has been ambulating with RW since fall 04/13/24 ADLs Comments: mod I, plans to wear dresses, no socks and slip on shoes    OT Problem List: Decreased activity tolerance;Impaired balance (sitting and/or standing);Pain   OT Treatment/Interventions: Self-care/ADL training;DME and/or AE instruction;Patient/family education;Balance training;Therapeutic activities      OT Goals(Current goals can be found in the care plan section)   Acute Rehab OT Goals OT Goal Formulation: With patient Time For Goal Achievement: 05/19/24 Potential to Achieve Goals: Good ADL Goals Pt Will Perform Grooming: with modified independence;standing Pt Will Perform Lower Body Bathing: with modified independence;sit to/from stand Pt Will Perform Lower Body Dressing: with modified independence;sit to/from stand Pt Will Transfer to Toilet: with modified independence;ambulating Pt Will Perform Toileting - Clothing Manipulation and hygiene: with modified independence;sit to/from stand Additional ADL Goal #1: Pt will gather items necessary for ADLs around her room mod I.   OT Frequency:  Min 2X/week    Co-evaluation              AM-PAC OT 6 Clicks Daily Activity     Outcome Measure Help from another person eating meals?: None Help from another person taking care of personal grooming?: A Little Help from another  person toileting, which includes using toliet, bedpan, or urinal?: A Little Help from another person bathing (including washing, rinsing, drying)?: A Little Help from another person to put on and taking off regular upper body clothing?: A Little Help from another person to put on and taking off regular lower body clothing?: A Little 6 Click Score: 19   End of Session    Activity Tolerance: Treatment limited secondary to medical complications (Comment) (nausea) Patient left: in bed;with call bell/phone within reach  OT Visit Diagnosis: Unsteadiness on feet (R26.81);Other abnormalities of gait and mobility (R26.89)                Time: 8894-8873 OT Time Calculation (min): 21 min Charges:  OT General Charges $OT Visit: 1 Visit OT Evaluation $OT Eval Moderate Complexity: 1 Mod  Mliss HERO, OTR/L Acute Rehabilitation Services Office: [phone]   Kennth Mliss Helling 05/05/2024, 11:36 AM

## 2024-05-05 NOTE — Care Management Important Message (Signed)
 Important Message  Patient Details  Name: Kara Acevedo MRN: [id number] Date of Birth: [birth year]   Important Message Given:  Yes - Medicare IM     Jon Cruel 05/05/2024, 2:11 PM

## 2024-05-05 NOTE — Progress Notes (Signed)
 IV team was consulted for PIV placement. Pt has received 5+ IV catheters this hospitalization d/t antibiotic use. Although pt has adequate veins found on ultrasound, for further cannulation, PICC line placement is recommended if further IV placement is needed to preserve remaining veins.

## 2024-05-05 NOTE — Plan of Care (Signed)
  Problem: Clinical Measurements: Goal: Ability to maintain clinical measurements within normal limits will improve Outcome: Progressing   Problem: Activity: Goal: Risk for activity intolerance will decrease Outcome: Progressing   Problem: Nutrition: Goal: Adequate nutrition will be maintained Outcome: Progressing   Problem: Coping: Goal: Level of anxiety will decrease Outcome: Progressing   Problem: Elimination: Goal: Will not experience complications related to bowel motility Outcome: Progressing   Problem: Pain Managment: Goal: General experience of comfort will improve and/or be controlled Outcome: Progressing

## 2024-05-05 NOTE — Progress Notes (Signed)
 Orthopaedic Trauma Progress Note  SUBJECTIVE: Patient doing okay this morning.  Notes soreness to the leg but overall pain has been fairly well-controlled on current regimen.  Patient trying to limit her use of narcotics.  Tolerating diet and fluids.  Has not been out of bed since surgery yesterday. + BM. Intraoperative cultures obtained, Gram stain showing abundant WBCs but no organisms seen.  Cultures been incubated for better growth.  Blood cultures NGTD.  Lactic acid remains WNL.   OBJECTIVE:  Vitals:   05/05/24 0432 05/05/24 1119  BP: (!) 95/55 (!) 94/50  Pulse: 70 73  Resp: 18 18  Temp: 98.3 F (36.8 C) 98.5 F (36.9 C)  SpO2: 95%     Opiates Today (MME): Today's  total administered Morphine  Milligram Equivalents: 15 Opiates Yesterday (MME): Yesterday's total administered Morphine  Milligram Equivalents: 115  General: Resting in bed comfortably.  NAD.  Pleasant and cooperative Respiratory: No increased work of breathing.  Operative Extremity (LLE): Ace wrap dressing from foot to mid thigh is clean, dry, intact.  Patient with tenderness over the mid to distal thigh as expected.  Less tender to the calf, ankle, foot.  Ankle DF/PF intact.  + EHL/FHL.  Endorses sensation all aspects of the foot.  Toes warm and well-perfused. + DP pulse  IMAGING: Repeat imaging left femur performed in OTS clinic on 05/02/2024 stable.  No signs of any hardware failure or loosening.  Fracture remains appropriately aligned.  LABS:  Results for orders placed or performed during the hospital encounter of 05/02/24 (from the past 24 hours)  Comprehensive metabolic panel with GFR     Status: Abnormal   Collection Time: 05/05/24  2:24 AM  Result Value Ref Range   Sodium 134 (L) 135 - 145 mmol/L   Potassium 3.6 3.5 - 5.1 mmol/L   Chloride 106 98 - 111 mmol/L   CO2 22 22 - 32 mmol/L   Glucose, Bld 108 (H) 70 - 99 mg/dL   BUN 11 8 - 23 mg/dL   Creatinine, Ser 9.13 0.44 - 1.00 mg/dL   Calcium 7.9 (L) 8.9 -  10.3 mg/dL   Total Protein 5.3 (L) 6.5 - 8.1 g/dL   Albumin  2.1 (L) 3.5 - 5.0 g/dL   AST 41 15 - 41 U/L   ALT 28 0 - 44 U/L   Alkaline Phosphatase 78 38 - 126 U/L   Total Bilirubin 0.9 0.0 - 1.2 mg/dL   GFR, Estimated >39 >39 mL/min   Anion gap 6 5 - 15  Magnesium      Status: None   Collection Time: 05/05/24  2:24 AM  Result Value Ref Range   Magnesium  1.7 1.7 - 2.4 mg/dL  CBC     Status: Abnormal   Collection Time: 05/05/24  2:24 AM  Result Value Ref Range   WBC 7.3 4.0 - 10.5 K/uL   RBC 3.21 (L) 3.87 - 5.11 MIL/uL   Hemoglobin 9.9 (L) 12.0 - 15.0 g/dL   HCT 68.6 (L) 63.9 - 53.9 %   MCV 97.5 80.0 - 100.0 fL   MCH 30.8 26.0 - 34.0 pg   MCHC 31.6 30.0 - 36.0 g/dL   RDW 86.3 88.4 - 84.4 %   Platelets 284 150 - 400 K/uL   nRBC 0.0 0.0 - 0.2 %    ASSESSMENT: Kara Acevedo is a 68 y.o. female s/p ORIF left distal femur fracture 04/13/2024 with subsequent postoperative infection  Procedures:  INCISION AND DRAINAGE LEFT KNEE SEROMA/INFECTED HEMATOMA 05/04/2024 IRRIGATION DEBRIDEMENT  LEFT KNEE POSTOPERATIVE INFECTION 05/04/2024  CV/Blood loss: Acute blood loss anemia, Hgb 9.9 this AM. Hemodynamically stable  PLAN: Weightbearing: WBAT LLE ROM:  Unrestricted ROM  Incisional and dressing care: Reinforce dressing as needed Showering: Hold off on getting incision wet when showering Orthopedic device(s): None  Pain management:  1. Tylenol  1000 mg q 6 hours PRN 2. Robaxin  500 mg q 6 hours PRN 3. Oxycodone  2.5-5 mg q 4 hours PRN 4. Dilaudid  0.5 mg q 2 hours PRN VTE prophylaxis: Recommend holding chemical DVT prophylaxis x 48 hours postop.  Scheduled to start Lovenox  05/06/2024 at 10 AM.  Continue SCDs ID: Cefepime  and vancomycin  Foley/Lines:  No foley, KVO IVFs Impediments to Fracture Healing: Vitamin D  level 28, continue on supplementation  Dispo: Will continue to follow intraoperative cultures.  Appreciate assistance from ID with discharge ABX.  PT/OT as  able.  Follow - up plan: 2 weeks after discharge with Dr. Kendal for wound check, repeat x-rays, possible suture removal  Contact information:  Franky Kendal MD, Lauraine Moores PA-C. After hours and holidays please check [email or web address] for group call information for [facility]   Lauraine PATRIC Moores, PA-C [phone] (office) [email or web address]

## 2024-05-05 NOTE — Progress Notes (Signed)
 PROGRESS NOTE  Kara Acevedo FMW:[id number] DOB: [birth year] DOA: 05/02/2024 PCP: Renato Dorothey HERO, NP   LOS: 3 days   Brief Narrative / Interim history:  68 y.o. female with medical history significant of attention deficit hyper activity disorder, essential hypertension, history of bariatric surgery, GERD, history of periprosthetic fracture of the femur, osteoarthritis among other things who was admitted and had left total knee replacement on September 3.  Patient went home on the ninth.   Patient started noticing swelling and redness about 3 days ago.  It started a small point but spread across the suture line.  She discussed with her family and the daughter who is a Publishing rights manager who recommended the patient sees her PCP.  Will later she was evaluated by orthopedics and started on oral clindamycin through the weekend.  Patient has taken the clindamycin but did not feel better instead symptoms have gotten worse.    Subjective / 24h Interval events: Complains of some soreness of the left knee.  Denies any fever or chills.  Had some nausea last night postoperatively but that is better this morning  Assesement and Plan: Principal problem Surgical site infection - Ortho consulted, she was just admitted and discharged on 9/8 with left periprosthetic distal femur fracture, status post repair by Dr. Kendal on 9/3.  She has hardware placed in that area - Continue IV antibiotics, she was taken to the OR by Dr. Kendal on 9/24 status post I&D of left knee seroma/infected hematoma.  Intraoperative cultures were sent, results pending - ID consulted, for now continue antibiotics   Active problems  Essential hypertension -hold meds as her blood pressure still remains pretty low   GERD - PPIs   ADHD - Adderall in the hospital  Obesity, class II-BMI 38.2.  She would benefit from weight loss  Scheduled Meds:  amphetamine -dextroamphetamine   20 mg Oral Daily   docusate sodium   100 mg  Oral BID   [START ON 05/06/2024] enoxaparin  (LOVENOX ) injection  50 mg Subcutaneous Q24H   feeding supplement  237 mL Oral TID BM   hydrochlorothiazide   12.5 mg Oral Daily   Continuous Infusions:  sodium chloride  40 mL/hr at 05/04/24 1327   ceFEPime  (MAXIPIME ) IV 2 g (05/05/24 0504)   vancomycin  750 mg (05/05/24 0613)   PRN Meds:.acetaminophen , diphenhydrAMINE , HYDROmorphone  (DILAUDID ) injection, methocarbamol , metoCLOPramide  **OR** metoCLOPramide  (REGLAN ) injection, ondansetron  **OR** ondansetron  (ZOFRAN ) IV, oxyCODONE , polyethylene glycol  Current Outpatient Medications  Medication Instructions   [Paused] amLODipine  (NORVASC ) 5 mg, Oral, Daily, Please hold this medication if your systolic blood pressure ( top number) is less than 100.   amphetamine -dextroamphetamine  (ADDERALL) 20 MG tablet 20 mg, Daily   apixaban  (ELIQUIS ) 2.5 mg, Oral, 2 times daily   cyanocobalamin  (VITAMIN B12) 1000 MCG/ML injection    docusate sodium  (COLACE) 100 mg, Oral, 2 times daily   Ensure Max Protein (ENSURE MAX PROTEIN) LIQD 11 oz, Oral, 2 times daily   hydrochlorothiazide  (HYDRODIURIL ) 12.5 mg, Oral, Daily   hydrocortisone 2.5 % cream 1 Application, Topical, Daily PRN   ibuprofen  (ADVIL ) 800 mg, Daily PRN   [Paused] indapamide  (LOZOL ) 2.5 mg, Oral, Daily PRN, edema   methocarbamol  (ROBAXIN ) 500 mg, Oral, Every 6 hours PRN   Multiple Vitamin (MULTIVITAMIN WITH MINERALS) TABS tablet 1 tablet, Oral, Daily   ondansetron  (ZOFRAN ) 4 mg, Daily PRN   oxyCODONE  (OXY IR/ROXICODONE ) 2.5-5 mg, Oral, Every 4 hours PRN   potassium chloride  SA (KLOR-CON  M) 20 MEQ tablet 20 mEq, 3 times daily   Vitamin  D (Ergocalciferol ) (DRISDOL ) 50,000 Units, Oral, Every 7 days   Zepbound 2.5 mg, Weekly    Diet Orders (From admission, onward)     Start     Ordered   05/04/24 1135  Diet regular Room service appropriate? Yes; Fluid consistency: Thin  Diet effective now       Question Answer Comment  Room service appropriate? Yes    Fluid consistency: Thin      05/04/24 1134            DVT prophylaxis: SCDs Start: 05/04/24 1135   Lab Results  Component Value Date   PLT 284 05/05/2024      Code Status: Full Code  Family Communication: No family at bedside  Status is: Inpatient Remains inpatient appropriate because: Severity of illness  Level of care: Med-Surg  Consultants:  Orthopedic surgery ID  Objective: Vitals:   05/04/24 1606 05/04/24 2000 05/05/24 0000 05/05/24 0432  BP: (!) 105/44 (!) 93/50 (!) 92/52 (!) 95/55  Pulse: 70 70 74 70  Resp:  16 16 18   Temp:  99.4 F (37.4 C) 99.4 F (37.4 C) 98.3 F (36.8 C)  TempSrc:  Oral Oral Oral  SpO2:  100% 95% 95%  Weight:      Height:        Intake/Output Summary (Last 24 hours) at 05/05/2024 0948 Last data filed at 05/05/2024 0400 Gross per 24 hour  Intake 1400.96 ml  Output 320 ml  Net 1080.96 ml   Wt Readings from Last 3 Encounters:  05/04/24 104.3 kg  04/13/24 97.5 kg  02/23/22 96.9 kg    Examination: Constitutional: NAD Eyes: lids and conjunctivae normal, no scleral icterus ENMT: mmm Neck: normal, supple Respiratory: clear to auscultation bilaterally, no wheezing, no crackles. Normal respiratory effort.  Cardiovascular: Regular rate and rhythm, no murmurs / rubs / gallops. No LE edema. Abdomen: soft, no distention, no tenderness. Bowel sounds positive.   Data Reviewed: I have independently reviewed following labs and imaging studies   CBC Recent Labs  Lab 05/02/24 1254 05/02/24 1304 05/03/24 0327 05/04/24 0239 05/05/24 0224  WBC 9.0  --  6.5 6.5 7.3  HGB 11.3* 11.2* 10.6* 10.0* 9.9*  HCT 35.1* 33.0* 32.3* 31.4* 31.3*  PLT 380  --  311 298 284  MCV 95.9  --  96.4 97.8 97.5  MCH 30.9  --  31.6 31.2 30.8  MCHC 32.2  --  32.8 31.8 31.6  RDW 13.8  --  13.7 13.6 13.6  LYMPHSABS 1.0  --   --   --   --   MONOABS 0.9  --   --   --   --   EOSABS 0.1  --   --   --   --   BASOSABS 0.0  --   --   --   --     Recent  Labs  Lab 05/02/24 1254 05/02/24 1304 05/03/24 0327 05/04/24 0239 05/05/24 0224  NA 136 137 138 139 134*  K 3.6 3.8 3.6 3.5 3.6  CL 103 103 105 107 106  CO2 23  --  26 25 22   GLUCOSE 117* 122* 115* 91 108*  BUN 18 21 13 13 11   CREATININE 0.82 0.80 0.77 0.88 0.86  CALCIUM 8.3*  --  8.3* 8.3* 7.9*  AST 22  --  22  --  41  ALT 14  --  14  --  28  ALKPHOS 89  --  74  --  78  BILITOT 1.0  --  0.9  --  0.9  ALBUMIN  3.0*  --  2.5*  --  2.1*  MG  --   --   --   --  1.7  LATICACIDVEN  --  0.6  --   --   --     ------------------------------------------------------------------------------------------------------------------ No results for input(s): CHOL, HDL, LDLCALC, TRIG, CHOLHDL, LDLDIRECT in the last 72 hours.  No results found for: HGBA1C ------------------------------------------------------------------------------------------------------------------ No results for input(s): TSH, T4TOTAL, T3FREE, THYROIDAB in the last 72 hours.  Invalid input(s): FREET3  Cardiac Enzymes No results for input(s): CKMB, TROPONINI, MYOGLOBIN in the last 168 hours.  Invalid input(s): CK ------------------------------------------------------------------------------------------------------------------ No results found for: BNP  CBG: No results for input(s): GLUCAP in the last 168 hours.  Recent Results (from the past 240 hours)  Blood culture (routine x 2)     Status: None (Preliminary result)   Collection Time: 05/02/24  5:40 PM   Specimen: BLOOD  Result Value Ref Range Status   Specimen Description BLOOD LEFT ANTECUBITAL  Final   Special Requests   Final    BOTTLES DRAWN AEROBIC AND ANAEROBIC Blood Culture adequate volume   Culture   Final    NO GROWTH 3 DAYS Performed at [facility], [address] [place], [place] [address]    Report Status PENDING  Incomplete  Blood culture (routine x 2)     Status: None (Preliminary result)   Collection  Time: 05/02/24  6:40 PM   Specimen: BLOOD  Result Value Ref Range Status   Specimen Description BLOOD RIGHT ANTECUBITAL  Final   Special Requests   Final    BOTTLES DRAWN AEROBIC AND ANAEROBIC Blood Culture results may not be optimal due to an inadequate volume of blood received in culture bottles   Culture   Final    NO GROWTH 3 DAYS Performed at [facility], [address], [place], [place] [address]    Report Status PENDING  Incomplete  Aerobic/Anaerobic Culture w Gram Stain (surgical/deep wound)     Status: None (Preliminary result)   Collection Time: 05/04/24  9:25 AM   Specimen: Leg, Left; Tissue  Result Value Ref Range Status   Specimen Description TISSUE  Final   Special Requests LEFT KNEE INFECTION  Final   Gram Stain   Final    ABUNDANT WBC PRESENT, PREDOMINANTLY PMN NO ORGANISMS SEEN    Culture   Final    CULTURE REINCUBATED FOR BETTER GROWTH Performed at [facility], [address] [place], [place] [address]    Report Status PENDING  Incomplete  Aerobic/Anaerobic Culture w Gram Stain (surgical/deep wound)     Status: None (Preliminary result)   Collection Time: 05/04/24  9:38 AM   Specimen: Leg, Left; Tissue  Result Value Ref Range Status   Specimen Description TISSUE  Final   Special Requests LEFT KNEE INFECTION  Final   Gram Stain   Final    ABUNDANT WBC PRESENT, PREDOMINANTLY PMN NO ORGANISMS SEEN    Culture   Final    CULTURE REINCUBATED FOR BETTER GROWTH Performed at [facility], [address], [place], [place] [address]    Report Status PENDING  Incomplete     Radiology Studies: DG C-Arm 1-60 Min-No Report Result Date: 05/04/2024 Fluoroscopy was utilized by the requesting physician.  No radiographic interpretation.   DG C-Arm 1-60 Min-No Report Result Date: 05/04/2024 Fluoroscopy was utilized by the requesting physician.  No radiographic interpretation.     Kara Waddell  Trixie, MD, PhD [facility]  Between 7 am - 7 pm I  am available, please contact me via Amion (for emergencies) or Securechat (non urgent messages)  Between 7 pm - 7 am I am not available, please contact night coverage MD/APP via Amion

## 2024-05-06 DIAGNOSIS — T847XXA Infection and inflammatory reaction due to other internal orthopedic prosthetic devices, implants and grafts, initial encounter: Secondary | ICD-10-CM

## 2024-05-06 DIAGNOSIS — T847XXD Infection and inflammatory reaction due to other internal orthopedic prosthetic devices, implants and grafts, subsequent encounter: Secondary | ICD-10-CM

## 2024-05-06 DIAGNOSIS — T8142XD Infection following a procedure, deep incisional surgical site, subsequent encounter: Secondary | ICD-10-CM | POA: Diagnosis not present

## 2024-05-06 LAB — CBC
HCT: 30.5 % — ABNORMAL LOW (ref 36.0–46.0)
Hemoglobin: 9.7 g/dL — ABNORMAL LOW (ref 12.0–15.0)
MCH: 30.9 pg (ref 26.0–34.0)
MCHC: 31.8 g/dL (ref 30.0–36.0)
MCV: 97.1 fL (ref 80.0–100.0)
Platelets: 268 K/uL (ref 150–400)
RBC: 3.14 MIL/uL — ABNORMAL LOW (ref 3.87–5.11)
RDW: 13.4 % (ref 11.5–15.5)
WBC: 6.3 K/uL (ref 4.0–10.5)
nRBC: 0 % (ref 0.0–0.2)

## 2024-05-06 LAB — COMPREHENSIVE METABOLIC PANEL WITH GFR
ALT: 31 U/L (ref 0–44)
AST: 33 U/L (ref 15–41)
Albumin: 2 g/dL — ABNORMAL LOW (ref 3.5–5.0)
Alkaline Phosphatase: 83 U/L (ref 38–126)
Anion gap: 9 (ref 5–15)
BUN: 11 mg/dL (ref 8–23)
CO2: 25 mmol/L (ref 22–32)
Calcium: 8.4 mg/dL — ABNORMAL LOW (ref 8.9–10.3)
Chloride: 103 mmol/L (ref 98–111)
Creatinine, Ser: 0.73 mg/dL (ref 0.44–1.00)
GFR, Estimated: >60 mL/min (ref >60)
Glucose, Bld: 100 mg/dL — ABNORMAL HIGH (ref 70–99)
Potassium: 3.9 mmol/L (ref 3.5–5.1)
Sodium: 137 mmol/L (ref 135–145)
Total Bilirubin: 0.6 mg/dL (ref 0.0–1.2)
Total Protein: 5.3 g/dL — ABNORMAL LOW (ref 6.5–8.1)

## 2024-05-06 LAB — MAGNESIUM: Magnesium: 1.8 mg/dL (ref 1.7–2.4)

## 2024-05-06 MED ORDER — SODIUM CHLORIDE 0.9 % IV SOLN
2.0000 g | Freq: Three times a day (TID) | INTRAVENOUS | Status: DC
  Administered 2024-05-06 – 2024-05-08 (×7): 2 g via INTRAVENOUS
  Filled 2024-05-06 (×7): qty 12.5

## 2024-05-06 NOTE — Progress Notes (Signed)
 PROGRESS NOTE  Kara Acevedo FMW:[id number] DOB: Dec 11, 1955 DOA: 05/02/2024 PCP: Renato Dorothey HERO, NP   LOS: 4 days   Brief Narrative / Interim history:  68 y.o. female with medical history significant of attention deficit hyper activity disorder, essential hypertension, history of bariatric surgery, GERD, history of periprosthetic fracture of the femur, osteoarthritis among other things who was admitted and had left total knee replacement on September 3.  Patient went home on the ninth.   Patient started noticing swelling and redness about 3 days ago.  It started a small point but spread across the suture line.  She discussed with her family and the daughter who is a Publishing rights manager who recommended the patient sees her PCP.  Will later she was evaluated by orthopedics and started on oral clindamycin through the weekend.  Patient has taken the clindamycin but did not feel better instead symptoms have gotten worse.    Subjective / 24h Interval events: Overall feeling well except for the knee pain/soreness.  Feels like the dressing is too tight and it limits her mobilization.  No chest pain, no abdominal pain, no shortness of breath.  No nausea or vomiting.  Assesement and Plan: Principal problem Surgical site infection - Ortho consulted, she was just admitted and discharged on 9/8 with left periprosthetic distal femur fracture, status post repair by Dr. Kendal on 9/3.  She has hardware placed in that area - Continue IV antibiotics, she was taken to the OR by Dr. Kendal on 9/24 status post I&D of left knee seroma/infected hematoma.  Intraoperative cultures were sent, results still pending this morning - ID consulted, for now continue IV antibiotics pending intraoperative cultures   Active problems  Essential hypertension -hold meds as her blood pressure still remains within normal parameters   GERD - PPIs   ADHD - Adderall in the hospital  Obesity, class II-BMI 38.2.  She  would benefit from weight loss  Scheduled Meds:  amphetamine -dextroamphetamine   20 mg Oral Daily   docusate sodium   100 mg Oral BID   enoxaparin  (LOVENOX ) injection  50 mg Subcutaneous Q24H   feeding supplement  237 mL Oral TID BM   Continuous Infusions:  ceFEPime  (MAXIPIME ) IV 2 g (05/06/24 1106)   PRN Meds:.acetaminophen , diphenhydrAMINE , HYDROmorphone  (DILAUDID ) injection, methocarbamol , metoCLOPramide  **OR** metoCLOPramide  (REGLAN ) injection, ondansetron  **OR** ondansetron  (ZOFRAN ) IV, oxyCODONE , polyethylene glycol  Current Outpatient Medications  Medication Instructions   [Paused] amLODipine  (NORVASC ) 5 mg, Oral, Daily, Please hold this medication if your systolic blood pressure ( top number) is less than 100.   amphetamine -dextroamphetamine  (ADDERALL) 20 MG tablet 20 mg, Daily   apixaban  (ELIQUIS ) 2.5 mg, Oral, 2 times daily   cyanocobalamin  (VITAMIN B12) 1000 MCG/ML injection    docusate sodium  (COLACE) 100 mg, Oral, 2 times daily   Ensure Max Protein (ENSURE MAX PROTEIN) LIQD 11 oz, Oral, 2 times daily   hydrochlorothiazide  (HYDRODIURIL ) 12.5 mg, Oral, Daily   hydrocortisone 2.5 % cream 1 Application, Topical, Daily PRN   ibuprofen  (ADVIL ) 800 mg, Daily PRN   [Paused] indapamide  (LOZOL ) 2.5 mg, Oral, Daily PRN, edema   methocarbamol  (ROBAXIN ) 500 mg, Oral, Every 6 hours PRN   Multiple Vitamin (MULTIVITAMIN WITH MINERALS) TABS tablet 1 tablet, Oral, Daily   ondansetron  (ZOFRAN ) 4 mg, Daily PRN   oxyCODONE  (OXY IR/ROXICODONE ) 2.5-5 mg, Oral, Every 4 hours PRN   potassium chloride  SA (KLOR-CON  M) 20 MEQ tablet 20 mEq, 3 times daily   Vitamin D  (Ergocalciferol ) (DRISDOL ) 50,000 Units, Oral, Every 7 days  Zepbound 2.5 mg, Weekly    Diet Orders (From admission, onward)     Start     Ordered   05/04/24 1135  Diet regular Room service appropriate? Yes; Fluid consistency: Thin  Diet effective now       Question Answer Comment  Room service appropriate? Yes   Fluid  consistency: Thin      05/04/24 1134            DVT prophylaxis: SCDs Start: 05/04/24 1135   Lab Results  Component Value Date   PLT 268 05/06/2024      Code Status: Full Code  Family Communication: No family at bedside  Status is: Inpatient Remains inpatient appropriate because: Severity of illness  Level of care: Med-Surg  Consultants:  Orthopedic surgery ID  Objective: Vitals:   05/05/24 1553 05/05/24 2043 05/06/24 0515 05/06/24 0805  BP: (!) 95/50 97/63 (!) 104/59 (!) 112/52  Pulse: 79 68 65 76  Resp: 18 19 18 16   Temp: 99.2 F (37.3 C) 98.3 F (36.8 C) 99 F (37.2 C) 98.3 F (36.8 C)  TempSrc: Oral Oral Oral Oral  SpO2: 100% 100% 100% 98%  Weight:      Height:        Intake/Output Summary (Last 24 hours) at 05/06/2024 1146 Last data filed at 05/06/2024 0846 Gross per 24 hour  Intake 1391.78 ml  Output 500 ml  Net 891.78 ml   Wt Readings from Last 3 Encounters:  05/04/24 104.3 kg  04/13/24 97.5 kg  02/23/22 96.9 kg    Examination: Constitutional: NAD Eyes: lids and conjunctivae normal, no scleral icterus ENMT: mmm Neck: normal, supple Respiratory: clear to auscultation bilaterally, no wheezing, no crackles. Normal respiratory effort.  Cardiovascular: Regular rate and rhythm, no murmurs / rubs / gallops. No LE edema. Abdomen: soft, no distention, no tenderness. Bowel sounds positive.  Skin: no rashes  Data Reviewed: I have independently reviewed following labs and imaging studies   CBC Recent Labs  Lab 05/02/24 1254 05/02/24 1304 05/03/24 0327 05/04/24 0239 05/05/24 0224 05/06/24 0503  WBC 9.0  --  6.5 6.5 7.3 6.3  HGB 11.3* 11.2* 10.6* 10.0* 9.9* 9.7*  HCT 35.1* 33.0* 32.3* 31.4* 31.3* 30.5*  PLT 380  --  311 298 284 268  MCV 95.9  --  96.4 97.8 97.5 97.1  MCH 30.9  --  31.6 31.2 30.8 30.9  MCHC 32.2  --  32.8 31.8 31.6 31.8  RDW 13.8  --  13.7 13.6 13.6 13.4  LYMPHSABS 1.0  --   --   --   --   --   MONOABS 0.9  --   --   --    --   --   EOSABS 0.1  --   --   --   --   --   BASOSABS 0.0  --   --   --   --   --     Recent Labs  Lab 05/02/24 1254 05/02/24 1304 05/03/24 0327 05/04/24 0239 05/05/24 0224 05/06/24 0503  NA 136 137 138 139 134* 137  K 3.6 3.8 3.6 3.5 3.6 3.9  CL 103 103 105 107 106 103  CO2 23  --  26 25 22 25   GLUCOSE 117* 122* 115* 91 108* 100*  BUN 18 21 13 13 11 11   CREATININE 0.82 0.80 0.77 0.88 0.86 0.73  CALCIUM 8.3*  --  8.3* 8.3* 7.9* 8.4*  AST 22  --  22  --  41 33  ALT 14  --  14  --  28 31  ALKPHOS 89  --  74  --  78 83  BILITOT 1.0  --  0.9  --  0.9 0.6  ALBUMIN  3.0*  --  2.5*  --  2.1* 2.0*  MG  --   --   --   --  1.7 1.8  LATICACIDVEN  --  0.6  --   --   --   --     ------------------------------------------------------------------------------------------------------------------ No results for input(s): CHOL, HDL, LDLCALC, TRIG, CHOLHDL, LDLDIRECT in the last 72 hours.  No results found for: HGBA1C ------------------------------------------------------------------------------------------------------------------ No results for input(s): TSH, T4TOTAL, T3FREE, THYROIDAB in the last 72 hours.  Invalid input(s): FREET3  Cardiac Enzymes No results for input(s): CKMB, TROPONINI, MYOGLOBIN in the last 168 hours.  Invalid input(s): CK ------------------------------------------------------------------------------------------------------------------ No results found for: BNP  CBG: No results for input(s): GLUCAP in the last 168 hours.  Recent Results (from the past 240 hours)  Blood culture (routine x 2)     Status: None (Preliminary result)   Collection Time: 05/02/24  5:40 PM   Specimen: BLOOD  Result Value Ref Range Status   Specimen Description BLOOD LEFT ANTECUBITAL  Final   Special Requests   Final    BOTTLES DRAWN AEROBIC AND ANAEROBIC Blood Culture adequate volume   Culture   Final    NO GROWTH 4 DAYS Performed at [facility], [address], [place], [place] [address]    Report Status PENDING  Incomplete  Blood culture (routine x 2)     Status: None (Preliminary result)   Collection Time: 05/02/24  6:40 PM   Specimen: BLOOD  Result Value Ref Range Status   Specimen Description BLOOD RIGHT ANTECUBITAL  Final   Special Requests   Final    BOTTLES DRAWN AEROBIC AND ANAEROBIC Blood Culture results may not be optimal due to an inadequate volume of blood received in culture bottles   Culture   Final    NO GROWTH 4 DAYS Performed at [facility], [address], [place], [place] [address]    Report Status PENDING  Incomplete  Aerobic/Anaerobic Culture w Gram Stain (surgical/deep wound)     Status: None (Preliminary result)   Collection Time: 05/04/24  9:25 AM   Specimen: Leg, Left; Tissue  Result Value Ref Range Status   Specimen Description TISSUE  Final   Special Requests LEFT KNEE INFECTION  Final   Gram Stain   Final    ABUNDANT WBC PRESENT, PREDOMINANTLY PMN NO ORGANISMS SEEN    Culture   Final    CULTURE REINCUBATED FOR BETTER GROWTH Performed at [facility], [address], [place], [place] [address]    Report Status PENDING  Incomplete  Aerobic/Anaerobic Culture w Gram Stain (surgical/deep wound)     Status: None (Preliminary result)   Collection Time: 05/04/24  9:38 AM   Specimen: Leg, Left; Tissue  Result Value Ref Range Status   Specimen Description TISSUE  Final   Special Requests LEFT KNEE INFECTION  Final   Gram Stain   Final    ABUNDANT WBC PRESENT, PREDOMINANTLY PMN NO ORGANISMS SEEN Performed at [facility], [address] [place], [place] [address]    Culture RARE PSEUDOMONAS AERUGINOSA  Final   Report Status PENDING  Incomplete     Radiology Studies: No results found.    Nilda Fendt, MD, PhD [facility]  Between  7 am - 7 pm I am available, please contact me via Amion (for emergencies) or Securechat (non urgent  messages)  Between 7 pm - 7 am I am not available, please contact night coverage MD/APP via Amion

## 2024-05-06 NOTE — Progress Notes (Signed)
 [facility]  Date of Admission:  05/02/2024     Reason for Follow Up: Post-operative infection  Total days of antibiotics 5         ASSESSMENT:  Kara Acevedo is a 68 year old Caucasian female with recent history of periprosthetic distal femur fracture requiring ORIF presenting with erythema and drainage from her surgical wound and found to have postoperative infected hematoma/seroma and now status post debridement.  Kara Acevedo is postop day #2 from debridement with surgical specimens growing Pseudomonas aeruginosa with sensitivities pending.  Discussed plan of care to change antibiotics to cefepime  and discontinue vancomycin .  Continue to monitor cultures for organisms and sensitivities with adjustment of antibiotics as appropriate.  Messaged with orthopedic team and infection not within the joint.  Continue postoperative wound care per orthopedics.  Will need PICC line prior to discharge and has requested to wait a day or 2 prior to PICC line insertion.  Continue standard precautions.  Remaining medical and supportive care per internal medicine.  PLAN:  Change antibiotics to cefepime  and discontinue vancomycin  Monitor surgical specimens for organisms and sensitivities with narrowing of antibiotics as able. Postoperative wound care per orthopedics. Will need PICC line in the next 1 to 2 days for prolonged antibiotic therapy of at least 6 weeks. Standard/universal precautions. Remaining medical and supportive care per internal medicine.  Dr. Overton is available over the weekend for ID related questions/concerns.  Principal Problem:   Post-operative infection Active Problems:   ADHD   Essential hypertension   History of bariatric surgery   Esophageal reflux    amphetamine -dextroamphetamine   20 mg Oral Daily   docusate sodium   100 mg Oral BID   enoxaparin  (LOVENOX ) injection  50 mg Subcutaneous Q24H   feeding supplement  237 mL Oral TID BM     SUBJECTIVE:  Afebrile overnight with no acute events.  Tolerating antibiotics with no adverse side effects.  Allergies  Allergen Reactions   Ace Inhibitors Other (See Comments) and Cough    Uvula swelling   Doxycycline Nausea And Vomiting   Latex Other (See Comments)    Blisters    Sulfa Antibiotics Rash   Fluconazole Other (See Comments)    Feels like a burning sensation from inside out   Nickel Swelling and Dermatitis    Blistering     Review of Systems: Review of Systems  Constitutional:  Negative for chills, fever and weight loss.  Respiratory:  Negative for cough, shortness of breath and wheezing.   Cardiovascular:  Negative for chest pain and leg swelling.  Gastrointestinal:  Negative for abdominal pain, constipation, diarrhea, nausea and vomiting.  Skin:  Negative for rash.      OBJECTIVE: Vitals:   05/05/24 1553 05/05/24 2043 05/06/24 0515 05/06/24 0805  BP: (!) 95/50 97/63 (!) 104/59 (!) 112/52  Pulse: 79 68 65 76  Resp: 18 19 18 16   Temp: 99.2 F (37.3 C) 98.3 F (36.8 C) 99 F (37.2 C) 98.3 F (36.8 C)  TempSrc: Oral Oral Oral Oral  SpO2: 100% 100% 100% 98%  Weight:      Height:       Body mass index is 38.26 kg/m.  Physical Exam Constitutional:      General: She is not in acute distress.    Appearance: She is well-developed.  Cardiovascular:     Rate and Rhythm: Normal rate and regular rhythm.     Heart sounds: Normal heart sounds.  Pulmonary:     Effort: Pulmonary  effort is normal.     Breath sounds: Normal breath sounds.  Skin:    General: Skin is warm and dry.  Neurological:     Mental Status: She is alert.     Lab Results Lab Results  Component Value Date   WBC 6.3 05/06/2024   HGB 9.7 (L) 05/06/2024   HCT 30.5 (L) 05/06/2024   MCV 97.1 05/06/2024   PLT 268 05/06/2024    Lab Results  Component Value Date   CREATININE 0.73 05/06/2024   BUN 11 05/06/2024   NA 137 05/06/2024   K 3.9 05/06/2024   CL 103 05/06/2024    CO2 25 05/06/2024    Lab Results  Component Value Date   ALT 31 05/06/2024   AST 33 05/06/2024   ALKPHOS 83 05/06/2024   BILITOT 0.6 05/06/2024     Microbiology: Recent Results (from the past 240 hours)  Blood culture (routine x 2)     Status: None (Preliminary result)   Collection Time: 05/02/24  5:40 PM   Specimen: BLOOD  Result Value Ref Range Status   Specimen Description BLOOD LEFT ANTECUBITAL  Final   Special Requests   Final    BOTTLES DRAWN AEROBIC AND ANAEROBIC Blood Culture adequate volume   Culture   Final    NO GROWTH 4 DAYS Performed at [facility], [address], [place], [place] [address]    Report Status PENDING  Incomplete  Blood culture (routine x 2)     Status: None (Preliminary result)   Collection Time: 05/02/24  6:40 PM   Specimen: BLOOD  Result Value Ref Range Status   Specimen Description BLOOD RIGHT ANTECUBITAL  Final   Special Requests   Final    BOTTLES DRAWN AEROBIC AND ANAEROBIC Blood Culture results may not be optimal due to an inadequate volume of blood received in culture bottles   Culture   Final    NO GROWTH 4 DAYS Performed at [facility], [address] [place], [place] [address]    Report Status PENDING  Incomplete  Aerobic/Anaerobic Culture w Gram Stain (surgical/deep wound)     Status: None (Preliminary result)   Collection Time: 05/04/24  9:25 AM   Specimen: Leg, Left; Tissue  Result Value Ref Range Status   Specimen Description TISSUE  Final   Special Requests LEFT KNEE INFECTION  Final   Gram Stain   Final    ABUNDANT WBC PRESENT, PREDOMINANTLY PMN NO ORGANISMS SEEN    Culture   Final    CULTURE REINCUBATED FOR BETTER GROWTH Performed at [facility], [address], [place], [place] [address]    Report Status PENDING  Incomplete  Aerobic/Anaerobic Culture w Gram Stain (surgical/deep wound)     Status: None (Preliminary result)   Collection Time: 05/04/24  9:38 AM   Specimen: Leg, Left; Tissue   Result Value Ref Range Status   Specimen Description TISSUE  Final   Special Requests LEFT KNEE INFECTION  Final   Gram Stain   Final    ABUNDANT WBC PRESENT, PREDOMINANTLY PMN NO ORGANISMS SEEN Performed at [facility], [address] [place], [place] [address]    Culture RARE PSEUDOMONAS AERUGINOSA  Final   Report Status PENDING  Incomplete     Kara July, Kara Acevedo [facility]  05/06/2024  11:56 AM

## 2024-05-06 NOTE — Plan of Care (Signed)
   Problem: Health Behavior/Discharge Planning: Goal: Ability to manage health-related needs will improve Outcome: Progressing   Problem: Clinical Measurements: Goal: Ability to maintain clinical measurements within normal limits will improve Outcome: Progressing Goal: Diagnostic test results will improve Outcome: Progressing

## 2024-05-06 NOTE — Progress Notes (Addendum)
 Mobility Specialist Progress Note:    05/06/24 1205  Mobility  Activity Ambulated with assistance (In room)  Level of Assistance Contact guard assist, steadying assist  Assistive Device Front wheel walker  Distance Ambulated (ft) 15 ft  LLE Weight Bearing Per Provider Order WBAT  Activity Response Tolerated well  Mobility Referral Yes  Mobility visit 1 Mobility  Mobility Specialist Start Time (ACUTE ONLY) [id number]  Mobility Specialist Stop Time (ACUTE ONLY) 0910  Mobility Specialist Time Calculation (min) (ACUTE ONLY) 14 min   Received pt in bed and agreeable to mobility. Pt requested to use the BR prior to ambulation. Pt required MinG d/t safety. Pt voided successfully. Pt c/o LLE soreness, otherwise tolerated well. Pt walked in room and was left in the chair. Personal belongings and call light within reach. All needs met.  Kara Acevedo Mobility Specialist  Please contact via Secure Chat or  Rehab Office [phone]]

## 2024-05-06 NOTE — NC FL2 (Signed)
 [place]  MEDICAID FL2 LEVEL OF CARE FORM     IDENTIFICATION  Patient Name: Kara Acevedo Birthdate: Feb 28, 1956 Sex: female Admission Date (Current Location): 05/02/2024  [facility] and [place] Number:  Producer, television/film/video and Address:  [facility], [address] [place], [place] [address]      Provider Number: [id number]  Attending Physician Name and Address:  Trixie Nilda HERO, MD  Relative Name and Phone Number:  Nat Douse; Daughter; [phone]    Current Level of Care: Hospital Recommended Level of Care: Skilled Nursing Facility Prior Approval Number:    Date Approved/Denied:   PASRR Number: [id number]  Discharge Plan: SNF    Current Diagnoses: Patient Active Problem List   Diagnosis Date Noted   Hardware complicating wound infection 05/06/2024   Post-operative infection 05/02/2024   History of bariatric surgery 05/02/2024   Periprosthetic fracture of shaft of femur 04/15/2024   Hip fracture (HCC) 02/23/2022   Hypokalemia 02/23/2022   ADHD 02/23/2022   Essential hypertension 02/23/2022   Rib pain 02/23/2022   Morbid obesity (HCC) 02/23/2022   Esophageal reflux 08/19/2012    Orientation RESPIRATION BLADDER Height & Weight     Self, Time, Situation, Place  Normal (Room Air) Continent Weight: 229 lb 15 oz (104.3 kg) Height:  5' 5 (165.1 cm)  BEHAVIORAL SYMPTOMS/MOOD NEUROLOGICAL BOWEL NUTRITION STATUS      Continent Diet (Please see discharge summary)  AMBULATORY STATUS COMMUNICATION OF NEEDS Skin   Limited Assist Verbally Other (Comment) (Wound 04/13/24 1317 Surgical Closed Surgical Incision Knee Left)                       Personal Care Assistance Level of Assistance  Bathing, Feeding, Dressing Bathing Assistance: Limited assistance Feeding assistance: Limited assistance Dressing Assistance: Limited assistance     Functional Limitations Info  Sight Sight Info: Impaired (R and L (Eyeglasses))         SPECIAL CARE FACTORS FREQUENCY  PT (By licensed PT), OT (By licensed OT)     PT Frequency: 5x OT Frequency: 5x            Contractures Contractures Info: Not present    Additional Factors Info  Code Status, Allergies Code Status Info: Full Code Allergies Info: Doxycycline; Latex; Sulfa Antibiotics; Fluconazole; Nickel; Ace Inhibitors           Current Medications (05/06/2024):  This is the current hospital active medication list Current Facility-Administered Medications  Medication Dose Route Frequency Provider Last Rate Last Admin   acetaminophen  (TYLENOL ) tablet 1,000 mg  1,000 mg Oral Q6H PRN Danton Lauraine LABOR, PA-C   1,000 mg at 05/06/24 1103   amphetamine -dextroamphetamine  (ADDERALL) tablet 20 mg  20 mg Oral Daily McClung, Jorian Willhoite A, PA-C       ceFEPIme  (MAXIPIME ) 2 g in sodium chloride  0.9 % 100 mL IVPB  2 g Intravenous Q8H Vu, Trung T, MD 200 mL/hr at 05/06/24 1106 2 g at 05/06/24 1106   diphenhydrAMINE  (BENADRYL ) 12.5 MG/5ML elixir 12.5-25 mg  12.5-25 mg Oral Q4H PRN Danton Lauraine LABOR, PA-C       docusate sodium  (COLACE) capsule 100 mg  100 mg Oral BID Danton Lauraine A, PA-C   100 mg at 05/04/24 2150   enoxaparin  (LOVENOX ) injection 50 mg  50 mg Subcutaneous Q24H Danton Lauraine A, PA-C   50 mg at 05/06/24 1103   feeding supplement (ENSURE PLUS HIGH PROTEIN) liquid 237 mL  237 mL Oral TID BM  Danton Lauraine LABOR, PA-C   237 mL at 05/06/24 1403   HYDROmorphone  (DILAUDID ) injection 0.5 mg  0.5 mg Intravenous Q2H PRN Danton Lauraine LABOR, PA-C   0.5 mg at 05/04/24 1323   methocarbamol  (ROBAXIN ) tablet 500 mg  500 mg Oral Q6H PRN Danton Lauraine LABOR, PA-C   500 mg at 05/03/24 2029   metoCLOPramide  (REGLAN ) tablet 5-10 mg  5-10 mg Oral Q8H PRN Danton Lauraine LABOR, PA-C       Or   metoCLOPramide  (REGLAN ) injection 5-10 mg  5-10 mg Intravenous Q8H PRN Danton Lauraine LABOR, PA-C       ondansetron  (ZOFRAN ) tablet 4 mg  4 mg Oral Q6H PRN Danton Lauraine LABOR, PA-C   4 mg at 05/03/24 9182   Or    ondansetron  (ZOFRAN ) injection 4 mg  4 mg Intravenous Q6H PRN Danton Lauraine LABOR, PA-C   4 mg at 05/04/24 1836   oxyCODONE  (Oxy IR/ROXICODONE ) immediate release tablet 2.5-5 mg  2.5-5 mg Oral Q4H PRN Danton Lauraine LABOR, PA-C   5 mg at 05/06/24 1103   polyethylene glycol (MIRALAX  / GLYCOLAX ) packet 17 g  17 g Oral Daily PRN Danton Lauraine LABOR, PA-C         Discharge Medications: Please see discharge summary for a list of discharge medications.  Relevant Imaging Results:  Relevant Lab Results:   Additional Information SSN: [id number]  Lauraine FORBES Saa, LCSWA

## 2024-05-06 NOTE — Anesthesia Postprocedure Evaluation (Signed)
 Anesthesia Post Note  Patient: Kara Acevedo  Procedure(s) Performed: IRRIGATION AND DEBRIDEMENT KNEE (Left: Knee)     Patient location during evaluation: PACU Anesthesia Type: General Level of consciousness: awake and alert Pain management: pain level controlled Vital Signs Assessment: post-procedure vital signs reviewed and stable Respiratory status: spontaneous breathing, nonlabored ventilation, respiratory function stable and patient connected to nasal cannula oxygen Cardiovascular status: blood pressure returned to baseline and stable Postop Assessment: no apparent nausea or vomiting Anesthetic complications: no   No notable events documented.  Last Vitals:  Vitals:   05/06/24 0515 05/06/24 0805  BP: (!) 104/59 (!) 112/52  Pulse: 65 76  Resp: 18 16  Temp: 37.2 C 36.8 C  SpO2: 100% 98%    Last Pain:  Vitals:   05/06/24 1103  TempSrc:   PainSc: 7                  [name] S

## 2024-05-06 NOTE — TOC Progression Note (Addendum)
 Transition of Care [facility]) - Progression Note    Patient Details  Name: Kara Acevedo MRN: [id number] Date of Birth: 10/15/1955  [facility]) CM/SW Contact  Lauraine FORBES Saa, LCSWA Phone Number: 05/06/2024, 2:19 PM  Clinical Narrative:     2:19 PM Per PT, patient requested to discharge to SNF and is now demonstrating need for SNF. CSW sent patient's FL2 to SNFs in [facility]. Per MD, patient is to receive a PICC for long term IV ABX. CSW will continue to follow and be available to assist.  Expected Discharge Plan: Skilled Nursing Facility Barriers to Discharge: Continued Medical Work up, English as a second language teacher, SNF Pending bed offer               Expected Discharge Plan and Services In-house Referral: Clinical Social Work Discharge Planning Services: [employer] Consult Post Acute Care Choice: Skilled Nursing Facility Living arrangements for the past 2 months: Single Family Home                 DME Arranged: N/A DME Agency: NA       HH Arranged: PT, OT HH Agency: [facility] Date [facility] Agency Contacted: 05/03/24 Time HH Agency Contacted: 1048 Representative spoke with at [facility] Agency: Darleene with [place] aware of patient admission. Hedda will need new orders and face to face at discharge to continue services.   Social Drivers of Health (SDOH) Interventions SDOH Screenings   Food Insecurity: No Food Insecurity (05/02/2024)  Housing: Low Risk  (05/02/2024)  Transportation Needs: No Transportation Needs (05/02/2024)  Utilities: Not At Risk (05/02/2024)  Financial Resource Strain: Low Risk  (09/27/2021)   Received from [facility]  Social Connections: Socially Integrated (05/02/2024)  Stress: No Stress Concern Present (09/27/2021)   Received from [facility]  Tobacco Use: Low Risk  (05/04/2024)  Recent Concern: Tobacco Use - Medium Risk (03/07/2024)   Received from [facility]    Readmission Risk Interventions     No data to display

## 2024-05-06 NOTE — Plan of Care (Signed)
  Problem: Education: Goal: Knowledge of General Education information will improve Description: Including pain rating scale, medication(s)/side effects and non-pharmacologic comfort measures Outcome: Progressing   Problem: Health Behavior/Discharge Planning: Goal: Ability to manage health-related needs will improve Outcome: Progressing   Problem: Clinical Measurements: Goal: Ability to maintain clinical measurements within normal limits will improve Outcome: Progressing Goal: Diagnostic test results will improve 05/06/2024 1947 by Andrez Galley, RN Outcome: Progressing 05/06/2024 1944 by Andrez Galley, RN Outcome: Progressing Goal: Cardiovascular complication will be avoided Outcome: Progressing

## 2024-05-06 NOTE — Progress Notes (Signed)
 Orthopaedic Trauma Progress Note  SUBJECTIVE: Patient doing okay this morning, currently up in bedside chair.  Leg is sore but pain manageable. Wants her ace wrap off, notes he leg is itchy. Tolerating diet and fluids but hasn't had much of an appetite. Intraoperative cultures obtained, Gram stain showing abundant WBCs but no organisms seen.  Cultures been incubated for better growth.  Blood cultures NGTD.    Patient would like a SNF at d/c. State she lives in a basement apartment at her daughter's house, has 14 steps to get up to have access to the bathroom. She doesn't feel her leg is quite strong enough at this time to mobilize up and down these steps. Patient's preference is [facility].   OBJECTIVE:  Vitals:   05/06/24 0515 05/06/24 0805  BP: (!) 104/59 (!) 112/52  Pulse: 65 76  Resp: 18 16  Temp: 99 F (37.2 C) 98.3 F (36.8 C)  SpO2: 100% 98%    Opiates Today (MME): Today's  total administered Morphine  Milligram Equivalents: 0 Opiates Yesterday (MME): Yesterday's total administered Morphine  Milligram Equivalents: 30  General: Sitting up in bedside chair.  NAD.  Pleasant and cooperative Respiratory: No increased work of breathing.  Operative Extremity (LLE): Dressing removed, incisions are clean, dry, intact.  No drainage. Mild tenderness over the mid to distal thigh as expected.  No significant tenderness to the calf, ankle, foot.  Ankle DF/PF intact.  + EHL/FHL.  Endorses sensation all aspects of the foot.  Toes warm and well-perfused. + DP pulse  IMAGING: Repeat imaging left femur performed in OTS clinic on 05/02/2024 stable.  No signs of any hardware failure or loosening.  Fracture remains appropriately aligned.  LABS:  Results for orders placed or performed during the hospital encounter of 05/02/24 (from the past 24 hours)  CBC     Status: Abnormal   Collection Time: 05/06/24  5:03 AM  Result Value Ref Range   WBC 6.3 4.0 - 10.5 K/uL   RBC 3.14 (L) 3.87 - 5.11 MIL/uL    Hemoglobin 9.7 (L) 12.0 - 15.0 g/dL   HCT 69.4 (L) 63.9 - 53.9 %   MCV 97.1 80.0 - 100.0 fL   MCH 30.9 26.0 - 34.0 pg   MCHC 31.8 30.0 - 36.0 g/dL   RDW 86.5 88.4 - 84.4 %   Platelets 268 150 - 400 K/uL   nRBC 0.0 0.0 - 0.2 %  Comprehensive metabolic panel with GFR     Status: Abnormal   Collection Time: 05/06/24  5:03 AM  Result Value Ref Range   Sodium 137 135 - 145 mmol/L   Potassium 3.9 3.5 - 5.1 mmol/L   Chloride 103 98 - 111 mmol/L   CO2 25 22 - 32 mmol/L   Glucose, Bld 100 (H) 70 - 99 mg/dL   BUN 11 8 - 23 mg/dL   Creatinine, Ser 9.26 0.44 - 1.00 mg/dL   Calcium 8.4 (L) 8.9 - 10.3 mg/dL   Total Protein 5.3 (L) 6.5 - 8.1 g/dL   Albumin  2.0 (L) 3.5 - 5.0 g/dL   AST 33 15 - 41 U/L   ALT 31 0 - 44 U/L   Alkaline Phosphatase 83 38 - 126 U/L   Total Bilirubin 0.6 0.0 - 1.2 mg/dL   GFR, Estimated >39 >39 mL/min   Anion gap 9 5 - 15  Magnesium      Status: None   Collection Time: 05/06/24  5:03 AM  Result Value Ref Range   Magnesium  1.8 1.7 -  2.4 mg/dL    ASSESSMENT: Kara Acevedo is a 68 y.o. female s/p ORIF left distal femur fracture 04/13/2024 with subsequent postoperative infection  Procedures:  INCISION AND DRAINAGE LEFT KNEE SEROMA/INFECTED HEMATOMA 05/04/2024 IRRIGATION DEBRIDEMENT LEFT KNEE POSTOPERATIVE INFECTION 05/04/2024  CV/Blood loss: Acute blood loss anemia, Hgb 9.7 this AM. Hemodynamically stable  PLAN: Weightbearing: WBAT LLE ROM:  Unrestricted ROM  Incisional and dressing care: Changed today, continue to change mepilex PRN Showering: Hold off on getting incision wet when showering Orthopedic device(s): None  Pain management:  1. Tylenol  1000 mg q 6 hours PRN 2. Robaxin  500 mg q 6 hours PRN 3. Oxycodone  2.5-5 mg q 4 hours PRN 4. Dilaudid  0.5 mg q 2 hours PRN VTE prophylaxis: Lovenox  to start this AM.  Continue SCDs ID: Ceftriaxone  and vancomycin  per ID Foley/Lines:  No foley, KVO IVFs Impediments to Fracture Healing: Vitamin D  level 28,  continue on supplementation  Dispo: Will continue to follow intraoperative cultures.  Appreciate assistance from ID with discharge ABX.  PT/OT as able. TOC involved for possible placement  Follow - up plan: 2 weeks after discharge with Dr. Kendal for wound check, repeat x-rays, possible suture removal  Contact information:  Franky Kendal MD, Lauraine Moores PA-C. After hours and holidays please check [email or web address] for group call information for [facility]   Lauraine PATRIC Moores, PA-C [phone] (office) [email or web address]

## 2024-05-06 NOTE — Progress Notes (Signed)
 Physical Therapy Treatment Patient Details Name: Kara Acevedo MRN: [id number] DOB: [birth year] Today's Date: 05/06/2024   History of Present Illness Kara Acevedo is an 68 y.o. female who presents on 05/02/24 with swelling and redness L knee surgical site incision.  I & D of L knee 05/04/24. PMHx: HTN, ADHD, bariatric surgery, GERD, arthritis, R TKA, L TKA,  left hip IM nail (02/24/22), L periprosthetic distal femur fx with ORIF 04/13/24    PT Comments  Slow progress towards goals. Currently Min A for bed mobility, CGA for sit to stand and CGA for non-functional in-home distance with significant decrease in speed with step to gait pattern. Pt is unabl eto fully WB through the LLE with less than 50% WB with very heavy UE support on RW making pt unstable with use of RW. +2 chair follow for safety with gait secondary to pain/fatigue. Pt has steps she has to navigate at home and due to inability to fully WB through LLE pt will be unable to navigate steps at this time. Due to pt current functional status, home set up and available assistance at home recommending skilled physical therapy services < 3 hours/day in order to address strength, balance and functional mobility to decrease risk for falls, injury, immobility, skin break down and re-hospitalization.      If plan is discharge home, recommend the following: Assistance with cooking/housework;Assist for transportation;Help with stairs or ramp for entrance   Can travel by private vehicle     Yes  Equipment Recommendations  None recommended by PT       Precautions / Restrictions Precautions Precautions: Fall Recall of Precautions/Restrictions: Intact Restrictions Weight Bearing Restrictions Per Provider Order: No LLE Weight Bearing Per Provider Order: Weight bearing as tolerated     Mobility  Bed Mobility Overal bed mobility: Needs Assistance Bed Mobility: Supine to Sit, Sit to Supine     Supine to sit: Contact  guard Sit to supine: Contact guard assist   General bed mobility comments: HOB up, increased time, aware of how to use gait belt as leg lifter, min A to manage RLE to the bed    Transfers Overall transfer level: Needs assistance Equipment used: Rolling walker (2 wheels) Transfers: Sit to/from Stand, Bed to chair/wheelchair/BSC Sit to Stand: Contact guard assist   Step pivot transfers: Contact guard assist       General transfer comment: CGA for safety and stability    Ambulation/Gait Ambulation/Gait assistance: Contact guard assist, +2 safety/equipment Gait Distance (Feet): 45 Feet Assistive device: Rolling walker (2 wheels) Gait Pattern/deviations: Step-to pattern, Decreased stance time - left, Decreased step length - left, Decreased step length - right, Antalgic, Knee flexed in stance - left Gait velocity: decreased Gait velocity interpretation: <1.31 ft/sec, indicative of household ambulator   General Gait Details: pt with forefoot initial contact on the L with very small step to gait pattern. Significant increase in time to ambulate 45 ft with chair follow due to fatigue. Pt WB less than 50% through LLE with heavy UE support on RW and flat foot initial contact on the R. Pt gait is unsteady      Balance Overall balance assessment: Needs assistance Sitting-balance support: No upper extremity supported, Feet supported Sitting balance-Leahy Scale: Good     Standing balance support: Single extremity supported, During functional activity Standing balance-Leahy Scale: Poor Standing balance comment: needs at least unilateral UE support due to inability to put full wt through LLE      Communication Communication Communication:  No apparent difficulties  Cognition Arousal: Alert Behavior During Therapy: WFL for tasks assessed/performed   PT - Cognitive impairments: No apparent impairments     Following commands: Intact      Cueing Cueing Techniques: Verbal cues          Pertinent Vitals/Pain Pain Assessment Pain Assessment: Faces Faces Pain Scale: Hurts even more Pain Location: L knee Pain Descriptors / Indicators: Discomfort, Grimacing, Guarding Pain Intervention(s): Limited activity within patient's tolerance, Monitored during session, Premedicated before session     PT Goals (current goals can now be found in the care plan section) Acute Rehab PT Goals Patient Stated Goal: get better PT Goal Formulation: With patient Time For Goal Achievement: 05/17/24 Potential to Achieve Goals: Good Progress towards PT goals: Progressing toward goals    Frequency    Min 2X/week      PT Plan  Continue with current POC     AM-PAC PT 6 Clicks Mobility   Outcome Measure  Help needed turning from your back to your side while in a flat bed without using bedrails?: None Help needed moving from lying on your back to sitting on the side of a flat bed without using bedrails?: A Little Help needed moving to and from a bed to a chair (including a wheelchair)?: A Little Help needed standing up from a chair using your arms (e.g., wheelchair or bedside chair)?: A Little Help needed to walk in hospital room?: A Lot (+2 with chair follow) Help needed climbing 3-5 steps with a railing? : Total 6 Click Score: 16    End of Session Equipment Utilized During Treatment: Gait belt Activity Tolerance: Patient tolerated treatment well Patient left: in bed;with call bell/phone within reach Nurse Communication: Mobility status PT Visit Diagnosis: Pain;History of falling (Z91.81);Difficulty in walking, not elsewhere classified (R26.2) Pain - Right/Left: Left Pain - part of body: Leg     Time: 8966-8951 PT Time Calculation (min) (ACUTE ONLY): 15 min  Charges:    $Therapeutic Activity: 8-22 mins PT General Charges $$ ACUTE PT VISIT: 1 Visit                    Dorothyann Maier, DPT, CLT  Acute Rehabilitation Services Office: [phone] (Secure chat  preferred)    Dorothyann VEAR Maier 05/06/2024, 12:06 PM

## 2024-05-07 ENCOUNTER — Other Ambulatory Visit: Payer: Self-pay

## 2024-05-07 DIAGNOSIS — T8142XD Infection following a procedure, deep incisional surgical site, subsequent encounter: Secondary | ICD-10-CM | POA: Diagnosis not present

## 2024-05-07 LAB — CULTURE, BLOOD (ROUTINE X 2)
Culture: NO GROWTH
Culture: NO GROWTH
Special Requests: ADEQUATE

## 2024-05-07 MED ORDER — CHLORHEXIDINE GLUCONATE CLOTH 2 % EX PADS
6.0000 | MEDICATED_PAD | Freq: Every day | CUTANEOUS | Status: DC
  Administered 2024-05-07: 6 via TOPICAL

## 2024-05-07 MED ORDER — SODIUM CHLORIDE 0.9% FLUSH: 10.0000 mL | INTRAVENOUS | Status: DC | PRN

## 2024-05-07 NOTE — Progress Notes (Signed)
 PROGRESS NOTE  Kara Acevedo FMW:[id number] DOB: 1956-07-19 DOA: 05/02/2024 PCP: Renato Dorothey HERO, NP   LOS: 5 days   Brief Narrative / Interim history:  68 y.o. female with medical history significant of attention deficit hyper activity disorder, essential hypertension, history of bariatric surgery, GERD, history of periprosthetic fracture of the femur, osteoarthritis among other things who was admitted and had left total knee replacement on September 3.  Patient went home on the ninth.   Patient started noticing swelling and redness about 3 days ago.  It started a small point but spread across the suture line.  She discussed with her family and the daughter who is a Publishing rights manager who recommended the patient sees her PCP.  Will later she was evaluated by orthopedics and started on oral clindamycin through the weekend.  Patient has taken the clindamycin but did not feel better instead symptoms have gotten worse.    Subjective / 24h Interval events: Doing better today, no significant chest pain, shortness of breath, nausea or vomiting.  Assesement and Plan: Principal problem Surgical site infection - Ortho consulted, she was just admitted and discharged on 9/8 with left periprosthetic distal femur fracture, status post repair by Dr. Kendal on 9/3.  She has hardware placed in that area - Continue IV antibiotics, she was taken to the OR by Dr. Kendal on 9/24 status post I&D of left knee seroma/infected hematoma. - ID consulted, cultures growing Pseudomonas with sensitivities as below, - PICC line today in preparation for SNF prolonged antibiotics   Active problems  Essential hypertension -hold meds as her pressure still remains normal   GERD - PPIs   ADHD - Adderall in the hospital  Obesity, class II-BMI 38.2.  She would benefit from weight loss  Scheduled Meds:  amphetamine -dextroamphetamine   20 mg Oral Daily   docusate sodium   100 mg Oral BID   enoxaparin  (LOVENOX )  injection  50 mg Subcutaneous Q24H   feeding supplement  237 mL Oral TID BM   Continuous Infusions:  ceFEPime  (MAXIPIME ) IV 2 g (05/07/24 0343)   PRN Meds:.acetaminophen , diphenhydrAMINE , HYDROmorphone  (DILAUDID ) injection, methocarbamol , metoCLOPramide  **OR** metoCLOPramide  (REGLAN ) injection, ondansetron  **OR** ondansetron  (ZOFRAN ) IV, oxyCODONE , polyethylene glycol  Current Outpatient Medications  Medication Instructions   [Paused] amLODipine  (NORVASC ) 5 mg, Oral, Daily, Please hold this medication if your systolic blood pressure ( top number) is less than 100.   amphetamine -dextroamphetamine  (ADDERALL) 20 MG tablet 20 mg, Daily   apixaban  (ELIQUIS ) 2.5 mg, Oral, 2 times daily   cyanocobalamin  (VITAMIN B12) 1000 MCG/ML injection    docusate sodium  (COLACE) 100 mg, Oral, 2 times daily   Ensure Max Protein (ENSURE MAX PROTEIN) LIQD 11 oz, Oral, 2 times daily   hydrochlorothiazide  (HYDRODIURIL ) 12.5 mg, Oral, Daily   hydrocortisone 2.5 % cream 1 Application, Topical, Daily PRN   ibuprofen  (ADVIL ) 800 mg, Daily PRN   [Paused] indapamide  (LOZOL ) 2.5 mg, Oral, Daily PRN, edema   methocarbamol  (ROBAXIN ) 500 mg, Oral, Every 6 hours PRN   Multiple Vitamin (MULTIVITAMIN WITH MINERALS) TABS tablet 1 tablet, Oral, Daily   ondansetron  (ZOFRAN ) 4 mg, Daily PRN   oxyCODONE  (OXY IR/ROXICODONE ) 2.5-5 mg, Oral, Every 4 hours PRN   potassium chloride  SA (KLOR-CON  M) 20 MEQ tablet 20 mEq, 3 times daily   Vitamin D  (Ergocalciferol ) (DRISDOL ) 50,000 Units, Oral, Every 7 days   Zepbound 2.5 mg, Weekly    Diet Orders (From admission, onward)     Start     Ordered   05/04/24 1135  Diet regular Room service appropriate? Yes; Fluid consistency: Thin  Diet effective now       Question Answer Comment  Room service appropriate? Yes   Fluid consistency: Thin      05/04/24 1134            DVT prophylaxis: SCDs Start: 05/04/24 1135   Lab Results  Component Value Date   PLT 268 05/06/2024       Code Status: Full Code  Family Communication: No family at bedside  Status is: Inpatient Remains inpatient appropriate because: Severity of illness  Level of care: Med-Surg  Consultants:  Orthopedic surgery ID  Objective: Vitals:   05/06/24 1748 05/06/24 2056 05/07/24 0543 05/07/24 0801  BP: (!) 99/50 99/79 90/60  (!) 100/53  Pulse: 74 90 66 65  Resp: 16 18 16 17   Temp: 98.3 F (36.8 C) 98.3 F (36.8 C) 98.6 F (37 C) 98.1 F (36.7 C)  TempSrc: Oral Oral Oral Oral  SpO2: 100% 96% 98% 100%  Weight:      Height:        Intake/Output Summary (Last 24 hours) at 05/07/2024 1020 Last data filed at 05/07/2024 0800 Gross per 24 hour  Intake 1450 ml  Output 400 ml  Net 1050 ml   Wt Readings from Last 3 Encounters:  05/04/24 104.3 kg  04/13/24 97.5 kg  02/23/22 96.9 kg    Examination: Constitutional: NAD Eyes: lids and conjunctivae normal, no scleral icterus ENMT: mmm Neck: normal, supple Respiratory: clear to auscultation bilaterally, no wheezing, no crackles. Normal respiratory effort.  Cardiovascular: Regular rate and rhythm, no murmurs / rubs / gallops. No LE edema. Abdomen: soft, no distention, no tenderness. Bowel sounds positive.   Data Reviewed: I have independently reviewed following labs and imaging studies   CBC Recent Labs  Lab 05/02/24 1254 05/02/24 1304 05/03/24 0327 05/04/24 0239 05/05/24 0224 05/06/24 0503  WBC 9.0  --  6.5 6.5 7.3 6.3  HGB 11.3* 11.2* 10.6* 10.0* 9.9* 9.7*  HCT 35.1* 33.0* 32.3* 31.4* 31.3* 30.5*  PLT 380  --  311 298 284 268  MCV 95.9  --  96.4 97.8 97.5 97.1  MCH 30.9  --  31.6 31.2 30.8 30.9  MCHC 32.2  --  32.8 31.8 31.6 31.8  RDW 13.8  --  13.7 13.6 13.6 13.4  LYMPHSABS 1.0  --   --   --   --   --   MONOABS 0.9  --   --   --   --   --   EOSABS 0.1  --   --   --   --   --   BASOSABS 0.0  --   --   --   --   --     Recent Labs  Lab 05/02/24 1254 05/02/24 1304 05/03/24 0327 05/04/24 0239 05/05/24 0224  05/06/24 0503  NA 136 137 138 139 134* 137  K 3.6 3.8 3.6 3.5 3.6 3.9  CL 103 103 105 107 106 103  CO2 23  --  26 25 22 25   GLUCOSE 117* 122* 115* 91 108* 100*  BUN 18 21 13 13 11 11   CREATININE 0.82 0.80 0.77 0.88 0.86 0.73  CALCIUM 8.3*  --  8.3* 8.3* 7.9* 8.4*  AST 22  --  22  --  41 33  ALT 14  --  14  --  28 31  ALKPHOS 89  --  74  --  78 83  BILITOT 1.0  --  0.9  --  0.9 0.6  ALBUMIN  3.0*  --  2.5*  --  2.1* 2.0*  MG  --   --   --   --  1.7 1.8  LATICACIDVEN  --  0.6  --   --   --   --     ------------------------------------------------------------------------------------------------------------------ No results for input(s): CHOL, HDL, LDLCALC, TRIG, CHOLHDL, LDLDIRECT in the last 72 hours.  No results found for: HGBA1C ------------------------------------------------------------------------------------------------------------------ No results for input(s): TSH, T4TOTAL, T3FREE, THYROIDAB in the last 72 hours.  Invalid input(s): FREET3  Cardiac Enzymes No results for input(s): CKMB, TROPONINI, MYOGLOBIN in the last 168 hours.  Invalid input(s): CK ------------------------------------------------------------------------------------------------------------------ No results found for: BNP  CBG: No results for input(s): GLUCAP in the last 168 hours.  Recent Results (from the past 240 hours)  Blood culture (routine x 2)     Status: None   Collection Time: 05/02/24  5:40 PM   Specimen: BLOOD  Result Value Ref Range Status   Specimen Description BLOOD LEFT ANTECUBITAL  Final   Special Requests   Final    BOTTLES DRAWN AEROBIC AND ANAEROBIC Blood Culture adequate volume   Culture   Final    NO GROWTH 5 DAYS Performed at [facility], [address], [place], [place] [address]    Report Status 05/07/2024 FINAL  Final  Blood culture (routine x 2)     Status: None   Collection Time: 05/02/24  6:40 PM   Specimen: BLOOD   Result Value Ref Range Status   Specimen Description BLOOD RIGHT ANTECUBITAL  Final   Special Requests   Final    BOTTLES DRAWN AEROBIC AND ANAEROBIC Blood Culture results may not be optimal due to an inadequate volume of blood received in culture bottles   Culture   Final    NO GROWTH 5 DAYS Performed at [facility], [address] [place], [place] [address]    Report Status 05/07/2024 FINAL  Final  Aerobic/Anaerobic Culture w Gram Stain (surgical/deep wound)     Status: None (Preliminary result)   Collection Time: 05/04/24  9:25 AM   Specimen: Leg, Left; Tissue  Result Value Ref Range Status   Specimen Description TISSUE  Final   Special Requests LEFT KNEE INFECTION  Final   Gram Stain   Final    ABUNDANT WBC PRESENT, PREDOMINANTLY PMN NO ORGANISMS SEEN    Culture   Final    ABUNDANT ENTEROBACTER CLOACAE FEW PSEUDOMONAS AERUGINOSA CONFIRMATION OF SUSCEPTIBILITIES IN PROGRESS Performed at [facility], [address], [place], [place] [address]    Report Status PENDING  Incomplete   Organism ID, Bacteria ENTEROBACTER CLOACAE  Final      Susceptibility   Enterobacter cloacae - MIC*    CEFEPIME  <=0.12 SENSITIVE Sensitive     ERTAPENEM <=0.12 SENSITIVE Sensitive     CIPROFLOXACIN <=0.06 SENSITIVE Sensitive     GENTAMICIN <=1 SENSITIVE Sensitive     MEROPENEM <=0.25 SENSITIVE Sensitive     TRIMETH/SULFA <=20 SENSITIVE Sensitive     PIP/TAZO Value in next row Sensitive      <=4 SENSITIVEThis is a modified FDA-approved test that has been validated and its performance characteristics determined by the reporting laboratory.  This laboratory is certified under the Clinical Laboratory Improvement Amendments CLIA as qualified to perform high complexity clinical laboratory testing.    * ABUNDANT ENTEROBACTER CLOACAE  Aerobic/Anaerobic Culture w Gram Stain (surgical/deep wound)     Status: None (Preliminary result)  Collection Time: 05/04/24  9:38 AM   Specimen: Leg,  Left; Tissue  Result Value Ref Range Status   Specimen Description TISSUE  Final   Special Requests LEFT KNEE INFECTION  Final   Gram Stain   Final    ABUNDANT WBC PRESENT, PREDOMINANTLY PMN NO ORGANISMS SEEN    Culture   Final    RARE PSEUDOMONAS AERUGINOSA NO ANAEROBES ISOLATED; CULTURE IN PROGRESS FOR 5 DAYS CULTURE REINCUBATED FOR BETTER GROWTH Performed at [facility], [address] [place], [place] [address]    Report Status PENDING  Incomplete     Radiology Studies: US  EKG SITE RITE Result Date: 05/07/2024 If Site Rite image not attached, placement could not be confirmed due to current cardiac rhythm.     Nilda Fendt, MD, PhD [facility]  Between 7 am - 7 pm I am available, please contact me via Amion (for emergencies) or Securechat (non urgent messages)  Between 7 pm - 7 am I am not available, please contact night coverage MD/APP via Amion

## 2024-05-07 NOTE — Plan of Care (Signed)

## 2024-05-07 NOTE — Progress Notes (Signed)
 Patient admitted as a result of a fall, which indicated the need to put down the floor mats. Patient refused the floor mats fall risk intervention, she stated she was more likely to trip over the mat and fall.  I educated patient and notified the charge nurse.

## 2024-05-07 NOTE — Plan of Care (Signed)
 Id brief note   Operative cx now also growing enterobacter cloacae besides pseudomonas   -continue cefepime  -f/u final cx

## 2024-05-07 NOTE — Progress Notes (Signed)
 Peripherally Inserted Central Catheter Placement  The IV Nurse has discussed with the patient and/or persons authorized to consent for the patient, the purpose of this procedure and the potential benefits and risks involved with this procedure.  The benefits include less needle sticks, lab draws from the catheter, and the patient may be discharged home with the catheter. Risks include, but not limited to, infection, bleeding, blood clot (thrombus formation), and puncture of an artery; nerve damage and irregular heartbeat and possibility to perform a PICC exchange if needed/ordered by physician.  Alternatives to this procedure were also discussed.  Bard Power PICC patient education guide, fact sheet on infection prevention and patient information card has been provided to patient /or left at bedside.    PICC Placement Documentation  PICC Single Lumen 05/07/24 Right Basilic 36 cm 0 cm (Active)  Indication for Insertion or Continuance of Line Prolonged intravenous therapies 05/07/24 1843  Exposed Catheter (cm) 0 cm 05/07/24 1843  Site Assessment Clean, Dry, Intact 05/07/24 1843  Line Status Flushed;Saline locked;Blood return noted 05/07/24 1843  Dressing Type Transparent;Securing device 05/07/24 1843  Dressing Status Antimicrobial disc/dressing in place;Clean, Dry, Intact 05/07/24 1843  Line Care Connections checked and tightened 05/07/24 1843  Line Adjustment (NICU/IV Team Only) No 05/07/24 1843  Dressing Intervention New dressing;Adhesive placed at insertion site (IV team only) 05/07/24 1843  Dressing Change Due 05/14/24 05/07/24 1843       Kara Acevedo Sheral Ruth 05/07/2024, 6:44 PM

## 2024-05-08 DIAGNOSIS — T8142XD Infection following a procedure, deep incisional surgical site, subsequent encounter: Secondary | ICD-10-CM | POA: Diagnosis not present

## 2024-05-08 LAB — CBC
HCT: 28.8 % — ABNORMAL LOW (ref 36.0–46.0)
Hemoglobin: 9.2 g/dL — ABNORMAL LOW (ref 12.0–15.0)
MCH: 30.9 pg (ref 26.0–34.0)
MCHC: 31.9 g/dL (ref 30.0–36.0)
MCV: 96.6 fL (ref 80.0–100.0)
Platelets: 284 K/uL (ref 150–400)
RBC: 2.98 MIL/uL — ABNORMAL LOW (ref 3.87–5.11)
RDW: 13.2 % (ref 11.5–15.5)
WBC: 6.4 K/uL (ref 4.0–10.5)
nRBC: 0 % (ref 0.0–0.2)

## 2024-05-08 LAB — BASIC METABOLIC PANEL WITH GFR
Anion gap: 7 (ref 5–15)
BUN: 13 mg/dL (ref 8–23)
CO2: 24 mmol/L (ref 22–32)
Calcium: 7.5 mg/dL — ABNORMAL LOW (ref 8.9–10.3)
Chloride: 96 mmol/L — ABNORMAL LOW (ref 98–111)
Creatinine, Ser: 0.66 mg/dL (ref 0.44–1.00)
GFR, Estimated: >60 mL/min (ref >60)
Glucose, Bld: 111 mg/dL — ABNORMAL HIGH (ref 70–99)
Potassium: 3.8 mmol/L (ref 3.5–5.1)
Sodium: 127 mmol/L — ABNORMAL LOW (ref 135–145)

## 2024-05-08 LAB — OSMOLALITY, URINE: Osmolality, Ur: 711 mosm/kg (ref 300–900)

## 2024-05-08 LAB — SODIUM, URINE, RANDOM: Sodium, Ur: 151 mmol/L

## 2024-05-08 LAB — OSMOLALITY: Osmolality: 289 mosm/kg (ref 275–295)

## 2024-05-08 MED ORDER — SODIUM CHLORIDE 0.9 % IV SOLN
1.0000 g | Freq: Three times a day (TID) | INTRAVENOUS | Status: DC
  Administered 2024-05-08 – 2024-05-11 (×10): 1 g via INTRAVENOUS
  Filled 2024-05-08 (×10): qty 20

## 2024-05-08 MED ORDER — DICLOFENAC SODIUM 1 % EX GEL
2.0000 g | Freq: Four times a day (QID) | CUTANEOUS | Status: DC
  Administered 2024-05-08 – 2024-05-11 (×12): 2 g via TOPICAL
  Filled 2024-05-08: qty 100

## 2024-05-08 NOTE — Plan of Care (Signed)

## 2024-05-08 NOTE — Progress Notes (Addendum)
 PROGRESS NOTE  Kara Acevedo FMW:[id number] DOB: Mar 28, 1956 DOA: 05/02/2024 PCP: Renato Dorothey HERO, NP   LOS: 6 days   Brief Narrative / Interim history:  68 y.o. female with medical history significant of attention deficit hyper activity disorder, essential hypertension, history of bariatric surgery, GERD, history of periprosthetic fracture of the femur, osteoarthritis among other things who was admitted and had left total knee replacement on September 3.  Patient went home on the ninth.   Patient started noticing swelling and redness about 3 days ago.  It started a small point but spread across the suture line.  She discussed with her family and the daughter who is a Publishing rights manager who recommended the patient sees her PCP.  Will later she was evaluated by orthopedics and started on oral clindamycin through the weekend.  Patient has taken the clindamycin but did not feel better instead symptoms have gotten worse.    Subjective / 24h Interval events: Complains of muscle neck pain today, feels like she slept the wrong way.  No chest discomfort, no shortness of breath, no nausea or vomiting.  Assesement and Plan: Principal problem Surgical site infection - Ortho consulted, she was just admitted and discharged on 9/8 with left periprosthetic distal femur fracture, status post repair by Dr. Kendal on 9/3.  She has hardware placed in that area - Continue IV antibiotics, she was taken to the OR by Dr. Kendal on 9/24 status post I&D of left knee seroma/infected hematoma. - ID consulted, cultures growing Pseudomonas with sensitivities as below, - PICC line today in preparation for SNF prolonged antibiotics   Active problems  Essential hypertension -hold meds as her BP continues to remain normal this morning  Hyponatremia-sodium dipped to 127 today, also hypochloremic, query slight dehydration.  Monitor osmolalities, urine sodium.  GERD - PPIs   ADHD - Adderall in the  hospital  Obesity, class II-BMI 38.2.  She would benefit from weight loss  Scheduled Meds:  amphetamine -dextroamphetamine   20 mg Oral Daily   Chlorhexidine  Gluconate Cloth  6 each Topical Daily   docusate sodium   100 mg Oral BID   enoxaparin  (LOVENOX ) injection  50 mg Subcutaneous Q24H   feeding supplement  237 mL Oral TID BM   Continuous Infusions:  ceFEPime  (MAXIPIME ) IV 2 g (05/08/24 0352)   PRN Meds:.acetaminophen , diphenhydrAMINE , HYDROmorphone  (DILAUDID ) injection, methocarbamol , metoCLOPramide  **OR** metoCLOPramide  (REGLAN ) injection, ondansetron  **OR** ondansetron  (ZOFRAN ) IV, oxyCODONE , polyethylene glycol, sodium chloride  flush  Current Outpatient Medications  Medication Instructions   [Paused] amLODipine  (NORVASC ) 5 mg, Oral, Daily, Please hold this medication if your systolic blood pressure ( top number) is less than 100.   amphetamine -dextroamphetamine  (ADDERALL) 20 MG tablet 20 mg, Daily   apixaban  (ELIQUIS ) 2.5 mg, Oral, 2 times daily   cyanocobalamin  (VITAMIN B12) 1000 MCG/ML injection    docusate sodium  (COLACE) 100 mg, Oral, 2 times daily   Ensure Max Protein (ENSURE MAX PROTEIN) LIQD 11 oz, Oral, 2 times daily   hydrochlorothiazide  (HYDRODIURIL ) 12.5 mg, Oral, Daily   hydrocortisone 2.5 % cream 1 Application, Topical, Daily PRN   ibuprofen  (ADVIL ) 800 mg, Daily PRN   [Paused] indapamide  (LOZOL ) 2.5 mg, Oral, Daily PRN, edema   methocarbamol  (ROBAXIN ) 500 mg, Oral, Every 6 hours PRN   Multiple Vitamin (MULTIVITAMIN WITH MINERALS) TABS tablet 1 tablet, Oral, Daily   ondansetron  (ZOFRAN ) 4 mg, Daily PRN   oxyCODONE  (OXY IR/ROXICODONE ) 2.5-5 mg, Oral, Every 4 hours PRN   potassium chloride  SA (KLOR-CON  M) 20 MEQ tablet 20 mEq,  3 times daily   Vitamin D  (Ergocalciferol ) (DRISDOL ) 50,000 Units, Oral, Every 7 days   Zepbound 2.5 mg, Weekly    Diet Orders (From admission, onward)     Start     Ordered   05/04/24 1135  Diet regular Room service appropriate? Yes;  Fluid consistency: Thin  Diet effective now       Question Answer Comment  Room service appropriate? Yes   Fluid consistency: Thin      05/04/24 1134            DVT prophylaxis: SCDs Start: 05/04/24 1135   Lab Results  Component Value Date   PLT 284 05/08/2024      Code Status: Full Code  Family Communication: No family at bedside  Status is: Inpatient Remains inpatient appropriate because: Severity of illness  Level of care: Med-Surg  Consultants:  Orthopedic surgery ID  Objective: Vitals:   05/07/24 1553 05/07/24 2138 05/08/24 0405 05/08/24 0841  BP: 122/61 (!) 112/51 111/65 (!) 124/49  Pulse: 73 83 69 73  Resp: 17 20 17 14   Temp: 98.3 F (36.8 C) 98.6 F (37 C) 97.9 F (36.6 C) 98.6 F (37 C)  TempSrc: Oral Oral Oral Oral  SpO2: 100% 96% 99% 96%  Weight:      Height:        Intake/Output Summary (Last 24 hours) at 05/08/2024 1003 Last data filed at 05/07/2024 2130 Gross per 24 hour  Intake 460 ml  Output 300 ml  Net 160 ml   Wt Readings from Last 3 Encounters:  05/04/24 104.3 kg  04/13/24 97.5 kg  02/23/22 96.9 kg    Examination: Constitutional: NAD Eyes: lids and conjunctivae normal, no scleral icterus ENMT: mmm Neck: normal, supple Respiratory: clear to auscultation bilaterally, no wheezing, no crackles. Normal respiratory effort.  Cardiovascular: Regular rate and rhythm, no murmurs / rubs / gallops. No LE edema. Abdomen: soft, no distention, no tenderness. Bowel sounds positive.    Data Reviewed: I have independently reviewed following labs and imaging studies   CBC Recent Labs  Lab 05/02/24 1254 05/02/24 1304 05/03/24 0327 05/04/24 0239 05/05/24 0224 05/06/24 0503 05/08/24 0115  WBC 9.0  --  6.5 6.5 7.3 6.3 6.4  HGB 11.3*   < > 10.6* 10.0* 9.9* 9.7* 9.2*  HCT 35.1*   < > 32.3* 31.4* 31.3* 30.5* 28.8*  PLT 380  --  311 298 284 268 284  MCV 95.9  --  96.4 97.8 97.5 97.1 96.6  MCH 30.9  --  31.6 31.2 30.8 30.9 30.9  MCHC  32.2  --  32.8 31.8 31.6 31.8 31.9  RDW 13.8  --  13.7 13.6 13.6 13.4 13.2  LYMPHSABS 1.0  --   --   --   --   --   --   MONOABS 0.9  --   --   --   --   --   --   EOSABS 0.1  --   --   --   --   --   --   BASOSABS 0.0  --   --   --   --   --   --    < > = values in this interval not displayed.    Recent Labs  Lab 05/02/24 1254 05/02/24 1304 05/03/24 0327 05/04/24 0239 05/05/24 0224 05/06/24 0503 05/08/24 0115  NA 136 137 138 139 134* 137 127*  K 3.6 3.8 3.6 3.5 3.6 3.9 3.8  CL 103 103 105  107 106 103 96*  CO2 23  --  26 25 22 25 24   GLUCOSE 117* 122* 115* 91 108* 100* 111*  BUN 18 21 13 13 11 11 13   CREATININE 0.82 0.80 0.77 0.88 0.86 0.73 0.66  CALCIUM 8.3*  --  8.3* 8.3* 7.9* 8.4* 7.5*  AST 22  --  22  --  41 33  --   ALT 14  --  14  --  28 31  --   ALKPHOS 89  --  74  --  78 83  --   BILITOT 1.0  --  0.9  --  0.9 0.6  --   ALBUMIN  3.0*  --  2.5*  --  2.1* 2.0*  --   MG  --   --   --   --  1.7 1.8  --   LATICACIDVEN  --  0.6  --   --   --   --   --     ------------------------------------------------------------------------------------------------------------------ No results for input(s): CHOL, HDL, LDLCALC, TRIG, CHOLHDL, LDLDIRECT in the last 72 hours.  No results found for: HGBA1C ------------------------------------------------------------------------------------------------------------------ No results for input(s): TSH, T4TOTAL, T3FREE, THYROIDAB in the last 72 hours.  Invalid input(s): FREET3  Cardiac Enzymes No results for input(s): CKMB, TROPONINI, MYOGLOBIN in the last 168 hours.  Invalid input(s): CK ------------------------------------------------------------------------------------------------------------------ No results found for: BNP  CBG: No results for input(s): GLUCAP in the last 168 hours.  Recent Results (from the past 240 hours)  Blood culture (routine x 2)     Status: None   Collection Time:  05/02/24  5:40 PM   Specimen: BLOOD  Result Value Ref Range Status   Specimen Description BLOOD LEFT ANTECUBITAL  Final   Special Requests   Final    BOTTLES DRAWN AEROBIC AND ANAEROBIC Blood Culture adequate volume   Culture   Final    NO GROWTH 5 DAYS Performed at [facility], [address] [place], [place] [address]    Report Status 05/07/2024 FINAL  Final  Blood culture (routine x 2)     Status: None   Collection Time: 05/02/24  6:40 PM   Specimen: BLOOD  Result Value Ref Range Status   Specimen Description BLOOD RIGHT ANTECUBITAL  Final   Special Requests   Final    BOTTLES DRAWN AEROBIC AND ANAEROBIC Blood Culture results may not be optimal due to an inadequate volume of blood received in culture bottles   Culture   Final    NO GROWTH 5 DAYS Performed at [facility], [address] [place], [place] [address]    Report Status 05/07/2024 FINAL  Final  Aerobic/Anaerobic Culture w Gram Stain (surgical/deep wound)     Status: None (Preliminary result)   Collection Time: 05/04/24  9:25 AM   Specimen: Leg, Left; Tissue  Result Value Ref Range Status   Specimen Description TISSUE  Final   Special Requests LEFT KNEE INFECTION  Final   Gram Stain   Final    ABUNDANT WBC PRESENT, PREDOMINANTLY PMN NO ORGANISMS SEEN    Culture   Final    ABUNDANT ENTEROBACTER CLOACAE FEW PSEUDOMONAS AERUGINOSA CONFIRMATION OF SUSCEPTIBILITIES IN PROGRESS Performed at [facility], [address] [place], [place] [address]    Report Status PENDING  Incomplete   Organism ID, Bacteria ENTEROBACTER CLOACAE  Final      Susceptibility   Enterobacter cloacae - MIC*    CEFEPIME  <=0.12 SENSITIVE Sensitive  ERTAPENEM <=0.12 SENSITIVE Sensitive     CIPROFLOXACIN <=0.06 SENSITIVE Sensitive     GENTAMICIN <=1 SENSITIVE Sensitive     MEROPENEM <=0.25 SENSITIVE Sensitive     TRIMETH/SULFA <=20 SENSITIVE Sensitive     PIP/TAZO Value in next row Sensitive      <=4 SENSITIVEThis  is a modified FDA-approved test that has been validated and its performance characteristics determined by the reporting laboratory.  This laboratory is certified under the Clinical Laboratory Improvement Amendments CLIA as qualified to perform high complexity clinical laboratory testing.    * ABUNDANT ENTEROBACTER CLOACAE  Aerobic/Anaerobic Culture w Gram Stain (surgical/deep wound)     Status: None (Preliminary result)   Collection Time: 05/04/24  9:38 AM   Specimen: Leg, Left; Tissue  Result Value Ref Range Status   Specimen Description TISSUE  Final   Special Requests LEFT KNEE INFECTION  Final   Gram Stain   Final    ABUNDANT WBC PRESENT, PREDOMINANTLY PMN NO ORGANISMS SEEN Performed at [facility], [address], [place], [place] [address]    Culture   Final    RARE PSEUDOMONAS AERUGINOSA CULTURE REINCUBATED FOR BETTER GROWTH NO ANAEROBES ISOLATED; CULTURE IN PROGRESS FOR 5 DAYS    Report Status PENDING  Incomplete     Radiology Studies: US  EKG SITE RITE Result Date: 05/07/2024 If Site Rite image not attached, placement could not be confirmed due to current cardiac rhythm.     Nilda Fendt, MD, PhD [facility]  Between 7 am - 7 pm I am available, please contact me via Amion (for emergencies) or Securechat (non urgent messages)  Between 7 pm - 7 am I am not available, please contact night coverage MD/APP via Amion

## 2024-05-08 NOTE — Plan of Care (Addendum)
 Id brief note   PsA and enterobacter on cx Psa doesn't appear to be susceptible to cefepime  (confirmed with micro)    -switch abx to meropenem for now -discussed with primary team

## 2024-05-09 DIAGNOSIS — T8149XA Infection following a procedure, other surgical site, initial encounter: Secondary | ICD-10-CM

## 2024-05-09 DIAGNOSIS — B965 Pseudomonas (aeruginosa) (mallei) (pseudomallei) as the cause of diseases classified elsewhere: Secondary | ICD-10-CM

## 2024-05-09 DIAGNOSIS — R197 Diarrhea, unspecified: Secondary | ICD-10-CM

## 2024-05-09 DIAGNOSIS — B9689 Other specified bacterial agents as the cause of diseases classified elsewhere: Secondary | ICD-10-CM

## 2024-05-09 DIAGNOSIS — T8142XD Infection following a procedure, deep incisional surgical site, subsequent encounter: Secondary | ICD-10-CM | POA: Diagnosis not present

## 2024-05-09 LAB — BASIC METABOLIC PANEL WITH GFR
Anion gap: 10 (ref 5–15)
BUN: 10 mg/dL (ref 8–23)
CO2: 25 mmol/L (ref 22–32)
Calcium: 8.5 mg/dL — ABNORMAL LOW (ref 8.9–10.3)
Chloride: 104 mmol/L (ref 98–111)
Creatinine, Ser: 0.65 mg/dL (ref 0.44–1.00)
GFR, Estimated: >60 mL/min (ref >60)
Glucose, Bld: 92 mg/dL (ref 70–99)
Potassium: 3.5 mmol/L (ref 3.5–5.1)
Sodium: 139 mmol/L (ref 135–145)

## 2024-05-09 LAB — AEROBIC/ANAEROBIC CULTURE W GRAM STAIN (SURGICAL/DEEP WOUND)

## 2024-05-09 LAB — CREATININE, SERUM
Creatinine, Ser: 0.69 mg/dL (ref 0.44–1.00)
GFR, Estimated: >60 mL/min (ref >60)

## 2024-05-09 MED ORDER — SACCHAROMYCES BOULARDII 250 MG PO CAPS
250.0000 mg | ORAL_CAPSULE | Freq: Two times a day (BID) | ORAL | Status: DC
  Administered 2024-05-09 – 2024-05-11 (×5): 250 mg via ORAL
  Filled 2024-05-09 (×5): qty 1

## 2024-05-09 MED ORDER — MEROPENEM IV (FOR PTA / DISCHARGE USE ONLY): 1.0000 g | Freq: Three times a day (TID) | INTRAVENOUS | 0 refills | Status: AC

## 2024-05-09 NOTE — TOC Progression Note (Signed)
 Transition of Care [facility]) - Progression Note    Patient Details  Name: Kara Acevedo MRN: [id number] Date of Birth: Jun 22, 1956  [facility] of [facility]) CM/SW Contact  Dakarri Kessinger LITTIE [place]ose, [place] Phone Number: 05/09/2024, 4:25 PM  Clinical Narrative:    CSW initiated insurance process for [facility], ref# [id number]. [facility] can accept pt peding insurance approval. CSW will continue to follow.   Expected Discharge Plan: Skilled Nursing Facility Barriers to Discharge: Continued Medical Work up, English as a second language teacher, SNF Pending bed offer               Expected Discharge Plan and Services In-house Referral: Clinical Social Work Discharge Planning Services: [employer] Consult Post Acute Care Choice: Skilled Nursing Facility Living arrangements for the past 2 months: Single Family Home                 DME Arranged: N/A DME Agency: NA       HH Arranged: PT, OT HH Agency: [facility] Date HH Agency Contacted: 05/03/24 Time HH Agency Contacted: 1048 Representative spoke with at [facility] Agency: Darleene with [facility] aware of patient admission. [facility] will need new orders and face to face at discharge to continue services.   Social Drivers of Health (SDOH) Interventions SDOH Screenings   Food Insecurity: No Food Insecurity (05/02/2024)  Housing: Low Risk  (05/02/2024)  Transportation Needs: No Transportation Needs (05/02/2024)  Utilities: Not At Risk (05/02/2024)  Financial Resource Strain: Low Risk  (09/27/2021)   [facility]  Social Connections: Socially Integrated (05/02/2024)  Stress: No Stress Concern Present (09/27/2021)   Received from [facility]  Tobacco Use: Low Risk  (05/04/2024)  Recent Concern: Tobacco Use - Medium Risk (03/07/2024)   Received from [facility]    Readmission Risk Interventions     No data to display

## 2024-05-09 NOTE — TOC Progression Note (Signed)
 Transition of Care [facility]) - Progression Note    Patient Details  Name: Kara Acevedo MRN: [id number] Date of Birth: 17-Sep-1955  Transition of Care [facility]) CM/SW Contact  Iyan Flett LITTIE [place], [place] Phone Number: 05/09/2024, 12:06 PM  Clinical Narrative:    CSW provided pt with SNF bed offers. CSW will follow up on facility choice and initiate insurance auth.   Expected Discharge Plan: Skilled Nursing Facility Barriers to Discharge: Continued Medical Work up, English as a second language teacher, SNF Pending bed offer               Expected Discharge Plan and Services In-house Referral: Clinical Social Work Discharge Planning Services: [employer] Consult Post Acute Care Choice: Skilled Nursing Facility Living arrangements for the past 2 months: Single Family Home                 DME Arranged: N/A DME Agency: NA       HH Arranged: PT, OT HH Agency: [facility] Date HH Agency Contacted: 05/03/24 Time HH Agency Contacted: 1048 Representative spoke with at [facility] Agency: Darleene with [facility] aware of patient admission. [facility] will need new orders and face to face at discharge to continue services.   Social Drivers of Health (SDOH) Interventions SDOH Screenings   Food Insecurity: No Food Insecurity (05/02/2024)  Housing: Low Risk  (05/02/2024)  Transportation Needs: No Transportation Needs (05/02/2024)  Utilities: Not At Risk (05/02/2024)  Financial Resource Strain: Low Risk  (09/27/2021)   Received from [facility]  Social Connections: Socially Integrated (05/02/2024)  Stress: No Stress Concern Present (09/27/2021)   Received from [facility]  Tobacco Use: Low Risk  (05/04/2024)  Recent Concern: Tobacco Use - Medium Risk (03/07/2024)   Received from [facility]    Readmission Risk Interventions     No data to display

## 2024-05-09 NOTE — Progress Notes (Addendum)
 PROGRESS NOTE  Kara Acevedo FMW:[id number] DOB: 07-24-1956 DOA: 05/02/2024 PCP: Renato Dorothey HERO, NP   LOS: 7 days   Brief Narrative / Interim history:  68 y.o. female with medical history significant of attention deficit hyper activity disorder, essential hypertension, history of bariatric surgery, GERD, history of periprosthetic fracture of the femur, osteoarthritis among other things who was admitted and had left total knee replacement on September 3.  Patient went home on the ninth.   Patient started noticing swelling and redness about 3 days ago.  It started a small point but spread across the suture line.  She discussed with her family and the daughter who is a Publishing rights manager who recommended the patient sees her PCP.  Will later she was evaluated by orthopedics and started on oral clindamycin through the weekend.  Patient has taken the clindamycin but did not feel better instead symptoms have gotten worse.    Subjective / 24h Interval events: She is feeling well today, no chest pain, no shortness of breath.  No abdominal pain, no nausea or vomiting.  Assesement and Plan: Principal problem Surgical site infection - Ortho consulted, she was just admitted and discharged on 9/8 with left periprosthetic distal femur fracture, status post repair by Dr. Kendal on 9/3.  She has hardware placed in that area - Continue IV antibiotics, she was taken to the OR by Dr. Kendal on 9/24 status post I&D of left knee seroma/infected hematoma. - ID consulted, cultures growing Pseudomonas as well as Enterococcus, currently on meropenem since Pseudomonas appears to be resistant to cefepime  - PICC line placed over the weekend in preparation for SNF   Active problems  Essential hypertension -remains normotensive, continue to hold HCTZ  Hyponatremia-sodium dipped to 127 on 9/28, osmolalities and urine sodium points to her SIADH, etiology for that is not entirely clear, however postoperative  stress and infections can be causes for non-osmotic ADH release, leading to SIADH  GERD - PPIs   ADHD - Adderall in the hospital  Obesity, class II-BMI 38.2.  She would benefit from weight loss  Scheduled Meds:  amphetamine -dextroamphetamine   20 mg Oral Daily   diclofenac Sodium  2 g Topical QID   enoxaparin  (LOVENOX ) injection  50 mg Subcutaneous Q24H   feeding supplement  237 mL Oral TID BM   Continuous Infusions:  meropenem (MERREM) IV 1 g (05/09/24 0821)   PRN Meds:.acetaminophen , diphenhydrAMINE , HYDROmorphone  (DILAUDID ) injection, methocarbamol , metoCLOPramide  **OR** metoCLOPramide  (REGLAN ) injection, ondansetron  **OR** ondansetron  (ZOFRAN ) IV, oxyCODONE , polyethylene glycol, sodium chloride  flush  Current Outpatient Medications  Medication Instructions   [Paused] amLODipine  (NORVASC ) 5 mg, Oral, Daily, Please hold this medication if your systolic blood pressure ( top number) is less than 100.   amphetamine -dextroamphetamine  (ADDERALL) 20 MG tablet 20 mg, Daily   apixaban  (ELIQUIS ) 2.5 mg, Oral, 2 times daily   cyanocobalamin  (VITAMIN B12) 1000 MCG/ML injection    docusate sodium  (COLACE) 100 mg, Oral, 2 times daily   Ensure Max Protein (ENSURE MAX PROTEIN) LIQD 11 oz, Oral, 2 times daily   hydrochlorothiazide  (HYDRODIURIL ) 12.5 mg, Oral, Daily   hydrocortisone 2.5 % cream 1 Application, Topical, Daily PRN   ibuprofen  (ADVIL ) 800 mg, Daily PRN   [Paused] indapamide  (LOZOL ) 2.5 mg, Oral, Daily PRN, edema   methocarbamol  (ROBAXIN ) 500 mg, Oral, Every 6 hours PRN   Multiple Vitamin (MULTIVITAMIN WITH MINERALS) TABS tablet 1 tablet, Oral, Daily   ondansetron  (ZOFRAN ) 4 mg, Daily PRN   oxyCODONE  (OXY IR/ROXICODONE ) 2.5-5 mg, Oral, Every 4 hours  PRN   potassium chloride  SA (KLOR-CON  M) 20 MEQ tablet 20 mEq, 3 times daily   Vitamin D  (Ergocalciferol ) (DRISDOL ) 50,000 Units, Oral, Every 7 days   Zepbound 2.5 mg, Weekly    Diet Orders (From admission, onward)     Start      Ordered   05/04/24 1135  Diet regular Room service appropriate? Yes; Fluid consistency: Thin  Diet effective now       Question Answer Comment  Room service appropriate? Yes   Fluid consistency: Thin      05/04/24 1134            DVT prophylaxis: SCDs Start: 05/04/24 1135   Lab Results  Component Value Date   PLT 284 05/08/2024      Code Status: Full Code  Family Communication: No family at bedside  Status is: Inpatient Remains inpatient appropriate because: Severity of illness  Level of care: Med-Surg  Consultants:  Orthopedic surgery ID  Objective: Vitals:   05/08/24 1826 05/08/24 2124 05/09/24 0347 05/09/24 0806  BP: (!) 105/57 (!) 108/50 (!) 106/55 119/63  Pulse: 67 85 70 67  Resp: 18 18 17 18   Temp: 98.6 F (37 C) 98.6 F (37 C) 98.3 F (36.8 C) 98.1 F (36.7 C)  TempSrc: Oral Oral Oral   SpO2: 97% 98% 99% 98%  Weight:      Height:        Intake/Output Summary (Last 24 hours) at 05/09/2024 9081 Last data filed at 05/08/2024 1100 Gross per 24 hour  Intake 240 ml  Output --  Net 240 ml   Wt Readings from Last 3 Encounters:  05/04/24 104.3 kg  04/13/24 97.5 kg  02/23/22 96.9 kg    Examination: Constitutional: NAD Eyes: lids and conjunctivae normal, no scleral icterus ENMT: mmm Neck: normal, supple Respiratory: clear to auscultation bilaterally, no wheezing, no crackles. Normal respiratory effort.  Cardiovascular: Regular rate and rhythm, no murmurs / rubs / gallops. No LE edema. Abdomen: soft, no distention, no tenderness. Bowel sounds positive.    Data Reviewed: I have independently reviewed following labs and imaging studies   CBC Recent Labs  Lab 05/02/24 1254 05/02/24 1304 05/03/24 0327 05/04/24 0239 05/05/24 0224 05/06/24 0503 05/08/24 0115  WBC 9.0  --  6.5 6.5 7.3 6.3 6.4  HGB 11.3*   < > 10.6* 10.0* 9.9* 9.7* 9.2*  HCT 35.1*   < > 32.3* 31.4* 31.3* 30.5* 28.8*  PLT 380  --  311 298 284 268 284  MCV 95.9  --  96.4 97.8  97.5 97.1 96.6  MCH 30.9  --  31.6 31.2 30.8 30.9 30.9  MCHC 32.2  --  32.8 31.8 31.6 31.8 31.9  RDW 13.8  --  13.7 13.6 13.6 13.4 13.2  LYMPHSABS 1.0  --   --   --   --   --   --   MONOABS 0.9  --   --   --   --   --   --   EOSABS 0.1  --   --   --   --   --   --   BASOSABS 0.0  --   --   --   --   --   --    < > = values in this interval not displayed.    Recent Labs  Lab 05/02/24 1254 05/02/24 1304 05/03/24 0327 05/04/24 0239 05/05/24 0224 05/06/24 0503 05/08/24 0115 05/09/24 0114  NA 136 137 138 139 134* 137  127*  --   K 3.6 3.8 3.6 3.5 3.6 3.9 3.8  --   CL 103 103 105 107 106 103 96*  --   CO2 23  --  26 25 22 25 24   --   GLUCOSE 117* 122* 115* 91 108* 100* 111*  --   BUN 18 21 13 13 11 11 13   --   CREATININE 0.82 0.80 0.77 0.88 0.86 0.73 0.66 0.69  CALCIUM 8.3*  --  8.3* 8.3* 7.9* 8.4* 7.5*  --   AST 22  --  22  --  41 33  --   --   ALT 14  --  14  --  28 31  --   --   ALKPHOS 89  --  74  --  78 83  --   --   BILITOT 1.0  --  0.9  --  0.9 0.6  --   --   ALBUMIN  3.0*  --  2.5*  --  2.1* 2.0*  --   --   MG  --   --   --   --  1.7 1.8  --   --   LATICACIDVEN  --  0.6  --   --   --   --   --   --     ------------------------------------------------------------------------------------------------------------------ No results for input(s): CHOL, HDL, LDLCALC, TRIG, CHOLHDL, LDLDIRECT in the last 72 hours.  No results found for: HGBA1C ------------------------------------------------------------------------------------------------------------------ No results for input(s): TSH, T4TOTAL, T3FREE, THYROIDAB in the last 72 hours.  Invalid input(s): FREET3  Cardiac Enzymes No results for input(s): CKMB, TROPONINI, MYOGLOBIN in the last 168 hours.  Invalid input(s): CK ------------------------------------------------------------------------------------------------------------------ No results found for: BNP  CBG: No results for  input(s): GLUCAP in the last 168 hours.  Recent Results (from the past 240 hours)  Blood culture (routine x 2)     Status: None   Collection Time: 05/02/24  5:40 PM   Specimen: BLOOD  Result Value Ref Range Status   Specimen Description BLOOD LEFT ANTECUBITAL  Final   Special Requests   Final    BOTTLES DRAWN AEROBIC AND ANAEROBIC Blood Culture adequate volume   Culture   Final    NO GROWTH 5 DAYS Performed at [facility], [address] [place], [place] [address]    Report Status 05/07/2024 FINAL  Final  Blood culture (routine x 2)     Status: None   Collection Time: 05/02/24  6:40 PM   Specimen: BLOOD  Result Value Ref Range Status   Specimen Description BLOOD RIGHT ANTECUBITAL  Final   Special Requests   Final    BOTTLES DRAWN AEROBIC AND ANAEROBIC Blood Culture results may not be optimal due to an inadequate volume of blood received in culture bottles   Culture   Final    NO GROWTH 5 DAYS Performed at [facility], [address], [place], [place] [address]    Report Status 05/07/2024 FINAL  Final  Aerobic/Anaerobic Culture w Gram Stain (surgical/deep wound)     Status: None (Preliminary result)   Collection Time: 05/04/24  9:25 AM   Specimen: Leg, Left; Tissue  Result Value Ref Range Status   Specimen Description TISSUE  Final   Special Requests LEFT KNEE INFECTION  Final   Gram Stain   Final    ABUNDANT WBC PRESENT, PREDOMINANTLY PMN NO ORGANISMS SEEN Performed at [facility], [address], [place], [place] [address]  Culture   Final    ABUNDANT ENTEROBACTER CLOACAE FEW PSEUDOMONAS AERUGINOSA NO ANAEROBES ISOLATED; CULTURE IN PROGRESS FOR 5 DAYS    Report Status PENDING  Incomplete   Organism ID, Bacteria ENTEROBACTER CLOACAE  Final   Organism ID, Bacteria PSEUDOMONAS AERUGINOSA  Final      Susceptibility   Enterobacter cloacae - MIC*    CEFEPIME  <=0.12 SENSITIVE Sensitive     ERTAPENEM <=0.12 SENSITIVE Sensitive     CIPROFLOXACIN  <=0.06 SENSITIVE Sensitive     GENTAMICIN <=1 SENSITIVE Sensitive     MEROPENEM <=0.25 SENSITIVE Sensitive     TRIMETH/SULFA <=20 SENSITIVE Sensitive     PIP/TAZO Value in next row Sensitive      <=4 SENSITIVEThis is a modified FDA-approved test that has been validated and its performance characteristics determined by the reporting laboratory.  This laboratory is certified under the Clinical Laboratory Improvement Amendments CLIA as qualified to perform high complexity clinical laboratory testing.    * ABUNDANT ENTEROBACTER CLOACAE   Pseudomonas aeruginosa - MIC*    MEROPENEM Value in next row Sensitive      <=4 SENSITIVEThis is a modified FDA-approved test that has been validated and its performance characteristics determined by the reporting laboratory.  This laboratory is certified under the Clinical Laboratory Improvement Amendments CLIA as qualified to perform high complexity clinical laboratory testing.    CIPROFLOXACIN Value in next row Sensitive      <=4 SENSITIVEThis is a modified FDA-approved test that has been validated and its performance characteristics determined by the reporting laboratory.  This laboratory is certified under the Clinical Laboratory Improvement Amendments CLIA as qualified to perform high complexity clinical laboratory testing.    IMIPENEM Value in next row Sensitive      <=4 SENSITIVEThis is a modified FDA-approved test that has been validated and its performance characteristics determined by the reporting laboratory.  This laboratory is certified under the Clinical Laboratory Improvement Amendments CLIA as qualified to perform high complexity clinical laboratory testing.    CEFTAZIDIME/AVIBACTAM Value in next row Sensitive      <=4 SENSITIVEThis is a modified FDA-approved test that has been validated and its performance characteristics determined by the reporting laboratory.  This laboratory is certified under the Clinical Laboratory Improvement Amendments CLIA as  qualified to perform high complexity clinical laboratory testing.    CEFTOLOZANE/TAZOBACTAM Value in next row Sensitive      <=4 SENSITIVEThis is a modified FDA-approved test that has been validated and its performance characteristics determined by the reporting laboratory.  This laboratory is certified under the Clinical Laboratory Improvement Amendments CLIA as qualified to perform high complexity clinical laboratory testing.    TOBRAMYCIN  Value in next row Sensitive      <=4 SENSITIVEThis is a modified FDA-approved test that has been validated and its performance characteristics determined by the reporting laboratory.  This laboratory is certified under the Clinical Laboratory Improvement Amendments CLIA as qualified to perform high complexity clinical laboratory testing.    CEFTAZIDIME Value in next row Sensitive      <=4 SENSITIVEThis is a modified FDA-approved test that has been validated and its performance characteristics determined by the reporting laboratory.  This laboratory is certified under the Clinical Laboratory Improvement Amendments CLIA as qualified to perform high complexity clinical laboratory testing.    * FEW PSEUDOMONAS AERUGINOSA  Aerobic/Anaerobic Culture w Gram Stain (surgical/deep wound)     Status: None (Preliminary result)   Collection Time: 05/04/24  9:38 AM   Specimen: Leg, Left; Tissue  Result Value Ref Range Status   Specimen Description TISSUE  Final   Special Requests LEFT KNEE INFECTION  Final   Gram Stain   Final    ABUNDANT WBC PRESENT, PREDOMINANTLY PMN NO ORGANISMS SEEN Performed at [facility], [address], [place], [place] [address]    Culture   Final    RARE PSEUDOMONAS AERUGINOSA CULTURE REINCUBATED FOR BETTER GROWTH NO ANAEROBES ISOLATED; CULTURE IN PROGRESS FOR 5 DAYS    Report Status PENDING  Incomplete     Radiology Studies: No results found.     Nilda Fendt, MD, PhD [facility]  Between 7 am - 7 pm I am  available, please contact me via Amion (for emergencies) or Securechat (non urgent messages)  Between 7 pm - 7 am I am not available, please contact night coverage MD/APP via Amion

## 2024-05-09 NOTE — Progress Notes (Signed)
 PHARMACY CONSULT NOTE FOR:  OUTPATIENT  PARENTERAL ANTIBIOTIC THERAPY (OPAT)  Indication: hardware associated wound infection Regimen: meropenem 1g IV q8h End date: 06/19/2024  IV antibiotic discharge orders are pended. To discharging provider:  please sign these orders via discharge navigator,  Select New Orders & click on the button choice - Manage This Unsigned Work.     Thank you for allowing pharmacy to be a part of this patient's care.  Elma Fail, PharmD PGY1 Clinical Pharmacist [facility]  05/09/2024 10:14 AM

## 2024-05-09 NOTE — Plan of Care (Signed)
   Problem: Education: Goal: Knowledge of General Education information will improve Description Including pain rating scale, medication(s)/side effects and non-pharmacologic comfort measures Outcome: Progressing   Problem: Health Behavior/Discharge Planning: Goal: Ability to manage health-related needs will improve Outcome: Progressing

## 2024-05-09 NOTE — Progress Notes (Addendum)
 [facility]  I have reviewed the entire case in detail with the above APP and discussed the plan in detail. Therefore, I agree with the diagnoses recorded above. In addition, I have personally interviewed and examined the patient and have personally reviewed any x-ray/ CT scan images.   My additional thoughts are as follows:  The patient is a 68 year old female who presented with a ORIF distal femur fracture. She then developed a surgical site infection and is status post I&D on September 24th. Intra operative cultures grew Pseudomonas and Enterobacter cloacae. Recommend PICC line placement and 6 weeks of IV Meropenem  General: Well developed, well nourish  in no apparent distress HENT: Moist mucous membranes, normal nose, normal external ears, and normocephalic Neck: supple, trachea midline, and normal cervical range of motion Eyes: PERRL, EOMI, non-icteric, and normal conjunctivae and lids Lungs: Clear to auscultation bilaterally. No wheezing, rales or rhonchi Cardiac: Regular rate and rhythm. No murmurs, rubs or gallops. No peripheral edema Abdomen: Soft, Non distended, Non tender, active bowel sounds Skin: Intact, no focal erythema or rash, and warm and dry GU: Defered genital exam Musculoskeletal: Left leg incision with drainage noted Neuro: Alert, no focal neurologic deficits, moves all extremities Psych: Oriented x 3, cooperative  Kara Munch, MD [facility]  Date of Admission:  05/02/2024      Total days of antibiotics 7  Meropenem          ASSESSMENT: Kara Acevedo is a 68 y.o. female admitted with:   ORIF Distal Femure Fx 04/13/2024 - Surgical site infection, S/P I&D 05/04/24 - Pseudomonas (R-cefepime ) Enterobacter Cloacea - Ortho consulted, she was just admitted and discharged on 9/8 with left periprosthetic distal femur fracture, status post repair by Dr. Kendal on 9/3.  She has hardware  placed in that area - Continue IV antibiotics, she was taken to the OR by Dr. Kendal on 9/24 status post I&D of left knee seroma/infected hematoma. - ID consulted, cultures growing Pseudomonas and enterobacter cloacea.  - PICC line in place today  - OPAT to be arranged for 6 weeks IV meropenem (EOT 11/09) then will need transition to ciprofloxacin for suppression given hardware involved.  - FU with Dr. Kendal   Antibiotic associated diarrhea -  Start probiotic to see if that helps   Vascular Access -  -PICC in place RUA  -Home health orders to maintain PICC line care and education for patient described below (will be going to SNF for now)  Discharge Planning / Coordination of Care -  -Outpatient antibiotics set -Planning SNF  -Discussed with CM team in house   Medication Monitoring -  -Safety labs ordered and detailed below to be followed in OPAT clinic  IV abx as outlined below.  Has follow up with our team arranged on 10/21 @ 9:00 am  ID will sign off - please call back with any questions/concerns or if we can be of further assistance.    PLAN: IV abx as outlined below  OK to start probiotic to see if that helps   OPAT ORDERS:  Diagnosis: Hardware associated infection   Culture Result: Enterobacter cloacea, pseudomonas (R-cefepime )   Allergies  Allergen Reactions   Ace Inhibitors Other (See Comments) and Cough    Uvula swelling   Doxycycline Nausea And Vomiting   Latex Other (See Comments)    Blisters    Sulfa Antibiotics Rash   Fluconazole Other (See Comments)  Feels like a burning sensation from inside out   Nickel Swelling and Dermatitis    Blistering     Discharge antibiotics to be given via PICC line:  Meropenem 1 gm IV q8h    Duration: 6 weeks   End Date: 06-19-24  [facility] Care Per Protocol with [employer] Use: Home health RN for IV administration and teaching, line care and labs.    Labs weekly while on IV antibiotics: _x_ CBC with  differential __ BMP **TWICE WEEKLY ON VANCOMYCIN   _x_ CMP _x_ CRP __ ESR __ Vancomycin  trough TWICE WEEKLY __ CK  _x_ Please pull PIC at completion of IV antibiotics __ Please leave PIC in place until doctor has seen patient or been notified  Fax weekly labs to [phone]  Clinic Follow Up Appt: 10/21 @ 9:00 am with Cathlyn July, NP     Principal Problem:   Post-operative infection Active Problems:   ADHD   Essential hypertension   History of bariatric surgery   Esophageal reflux   Hardware complicating wound infection    amphetamine -dextroamphetamine   20 mg Oral Daily   diclofenac Sodium  2 g Topical QID   enoxaparin  (LOVENOX ) injection  50 mg Subcutaneous Q24H   feeding supplement  237 mL Oral TID BM    SUBJECTIVE: Feeling better. Planning to go to rehab facility as recommended by PT team. Worried about PICC line at home as well w/ animals in the house.  She is tolerating her abx w/o concerns.    Review of Systems: Review of Systems  Constitutional:  Negative for fever and malaise/fatigue.  Respiratory: Negative.    Cardiovascular:  Negative for chest pain.  Gastrointestinal:  Negative for abdominal pain, nausea and vomiting.  Genitourinary: Negative.   Musculoskeletal: Negative.   Skin:  Negative for rash.    Allergies  Allergen Reactions   Ace Inhibitors Other (See Comments) and Cough    Uvula swelling   Doxycycline Nausea And Vomiting   Latex Other (See Comments)    Blisters    Sulfa Antibiotics Rash   Fluconazole Other (See Comments)    Feels like a burning sensation from inside out   Nickel Swelling and Dermatitis    Blistering    OBJECTIVE: Vitals:   05/08/24 1826 05/08/24 2124 05/09/24 0347 05/09/24 0806  BP: (!) 105/57 (!) 108/50 (!) 106/55 119/63  Pulse: 67 85 70 67  Resp: 18 18 17 18   Temp: 98.6 F (37 C) 98.6 F (37 C) 98.3 F (36.8 C) 98.1 F (36.7 C)  TempSrc: Oral Oral Oral   SpO2: 97% 98% 99% 98%  Weight:      Height:        Body mass index is 38.26 kg/m.   Physical Exam Vitals reviewed.  Constitutional:      Appearance: She is not ill-appearing.  Cardiovascular:     Rate and Rhythm: Normal rate.     Heart sounds: No murmur heard. Pulmonary:     Effort: Pulmonary effort is normal.     Breath sounds: Normal breath sounds.  Abdominal:     General: There is no distension.  Musculoskeletal:        General: Normal range of motion.  Skin:    General: Skin is warm.     Comments: Left lateral leg incision with some thicker drainage noted on bandage.   Neurological:     Mental Status: She is alert.     Lab Results Lab Results  Component Value Date   WBC  6.4 05/08/2024   HGB 9.2 (L) 05/08/2024   HCT 28.8 (L) 05/08/2024   MCV 96.6 05/08/2024   PLT 284 05/08/2024    Lab Results  Component Value Date   CREATININE 0.65 05/09/2024   BUN 10 05/09/2024   NA 139 05/09/2024   K 3.5 05/09/2024   CL 104 05/09/2024   CO2 25 05/09/2024    Lab Results  Component Value Date   ALT 31 05/06/2024   AST 33 05/06/2024   ALKPHOS 83 05/06/2024   BILITOT 0.6 05/06/2024     Microbiology: Recent Results (from the past 240 hours)  Blood culture (routine x 2)     Status: None   Collection Time: 05/02/24  5:40 PM   Specimen: BLOOD  Result Value Ref Range Status   Specimen Description BLOOD LEFT ANTECUBITAL  Final   Special Requests   Final    BOTTLES DRAWN AEROBIC AND ANAEROBIC Blood Culture adequate volume   Culture   Final    NO GROWTH 5 DAYS Performed at [facility], [address], [place], [place] [address]    Report Status 05/07/2024 FINAL  Final  Blood culture (routine x 2)     Status: None   Collection Time: 05/02/24  6:40 PM   Specimen: BLOOD  Result Value Ref Range Status   Specimen Description BLOOD RIGHT ANTECUBITAL  Final   Special Requests   Final    BOTTLES DRAWN AEROBIC AND ANAEROBIC Blood Culture results may not be optimal due to an inadequate volume of blood received in  culture bottles   Culture   Final    NO GROWTH 5 DAYS Performed at [facility], [address], [place], [place] [address]    Report Status 05/07/2024 FINAL  Final  Aerobic/Anaerobic Culture w Gram Stain (surgical/deep wound)     Status: None (Preliminary result)   Collection Time: 05/04/24  9:25 AM   Specimen: Leg, Left; Tissue  Result Value Ref Range Status   Specimen Description TISSUE  Final   Special Requests LEFT KNEE INFECTION  Final   Gram Stain   Final    ABUNDANT WBC PRESENT, PREDOMINANTLY PMN NO ORGANISMS SEEN Performed at [facility], [address] [place], [place] [address]    Culture   Final    ABUNDANT ENTEROBACTER CLOACAE FEW PSEUDOMONAS AERUGINOSA NO ANAEROBES ISOLATED; CULTURE IN PROGRESS FOR 5 DAYS    Report Status PENDING  Incomplete   Organism ID, Bacteria ENTEROBACTER CLOACAE  Final   Organism ID, Bacteria PSEUDOMONAS AERUGINOSA  Final      Susceptibility   Enterobacter cloacae - MIC*    CEFEPIME  <=0.12 SENSITIVE Sensitive     ERTAPENEM <=0.12 SENSITIVE Sensitive     CIPROFLOXACIN <=0.06 SENSITIVE Sensitive     GENTAMICIN <=1 SENSITIVE Sensitive     MEROPENEM <=0.25 SENSITIVE Sensitive     TRIMETH/SULFA <=20 SENSITIVE Sensitive     PIP/TAZO Value in next row Sensitive      <=4 SENSITIVEThis is a modified FDA-approved test that has been validated and its performance characteristics determined by the reporting laboratory.  This laboratory is certified under the Clinical Laboratory Improvement Amendments CLIA as qualified to perform high complexity clinical laboratory testing.    * ABUNDANT ENTEROBACTER CLOACAE   Pseudomonas aeruginosa - MIC*    MEROPENEM Value in next row Sensitive      <=4 SENSITIVEThis is a modified FDA-approved test that has been validated and its performance characteristics determined by the reporting  laboratory.  This laboratory is certified under the Clinical Laboratory Improvement Amendments CLIA as qualified to perform  high complexity clinical laboratory testing.    CIPROFLOXACIN Value in next row Sensitive      <=4 SENSITIVEThis is a modified FDA-approved test that has been validated and its performance characteristics determined by the reporting laboratory.  This laboratory is certified under the Clinical Laboratory Improvement Amendments CLIA as qualified to perform high complexity clinical laboratory testing.    IMIPENEM Value in next row Sensitive      <=4 SENSITIVEThis is a modified FDA-approved test that has been validated and its performance characteristics determined by the reporting laboratory.  This laboratory is certified under the Clinical Laboratory Improvement Amendments CLIA as qualified to perform high complexity clinical laboratory testing.    CEFTAZIDIME/AVIBACTAM Value in next row Sensitive      <=4 SENSITIVEThis is a modified FDA-approved test that has been validated and its performance characteristics determined by the reporting laboratory.  This laboratory is certified under the Clinical Laboratory Improvement Amendments CLIA as qualified to perform high complexity clinical laboratory testing.    CEFTOLOZANE/TAZOBACTAM Value in next row Sensitive      <=4 SENSITIVEThis is a modified FDA-approved test that has been validated and its performance characteristics determined by the reporting laboratory.  This laboratory is certified under the Clinical Laboratory Improvement Amendments CLIA as qualified to perform high complexity clinical laboratory testing.    TOBRAMYCIN  Value in next row Sensitive      <=4 SENSITIVEThis is a modified FDA-approved test that has been validated and its performance characteristics determined by the reporting laboratory.  This laboratory is certified under the Clinical Laboratory Improvement Amendments CLIA as qualified to perform high complexity clinical laboratory testing.    CEFTAZIDIME Value in next row Sensitive      <=4 SENSITIVEThis is a modified FDA-approved test  that has been validated and its performance characteristics determined by the reporting laboratory.  This laboratory is certified under the Clinical Laboratory Improvement Amendments CLIA as qualified to perform high complexity clinical laboratory testing.    * FEW PSEUDOMONAS AERUGINOSA  Aerobic/Anaerobic Culture w Gram Stain (surgical/deep wound)     Status: None (Preliminary result)   Collection Time: 05/04/24  9:38 AM   Specimen: Leg, Left; Tissue  Result Value Ref Range Status   Specimen Description TISSUE  Final   Special Requests LEFT KNEE INFECTION  Final   Gram Stain   Final    ABUNDANT WBC PRESENT, PREDOMINANTLY PMN NO ORGANISMS SEEN Performed at [facility], [address] [place], [place] [address]    Culture   Final    RARE PSEUDOMONAS AERUGINOSA CULTURE REINCUBATED FOR BETTER GROWTH NO ANAEROBES ISOLATED; CULTURE IN PROGRESS FOR 5 DAYS    Report Status PENDING  Incomplete    Corean Fireman, MSN, NP-C [facility] Pager: [phone]  @TODAY @ 10:13 AM   Total Encounter Time: 20 m

## 2024-05-09 NOTE — Progress Notes (Signed)
 Mobility Specialist Progress Note:    05/09/24 1027  Mobility  Activity Ambulated with assistance (To BR)  Level of Assistance Contact guard assist, steadying assist  Assistive Device Front wheel walker  Distance Ambulated (ft) 30 ft  LLE Weight Bearing Per Provider Order WBAT  Activity Response Tolerated well  Mobility Referral Yes  Mobility visit 1 Mobility  Mobility Specialist Start Time (ACUTE ONLY) 1002  Mobility Specialist Stop Time (ACUTE ONLY) 1012  Mobility Specialist Time Calculation (min) (ACUTE ONLY) 10 min   Received pt in chair and agreeable to mobility. Pt requesting assist to the BR. Pt c/o LLE soreness, otherwise tolerated well. Pt left in bed. Personal belongings and call light within reach. All needs met.  Lavanda Pollack Mobility Specialist  Please contact via [employer] or  Rehab Office [phone]

## 2024-05-10 DIAGNOSIS — L089 Local infection of the skin and subcutaneous tissue, unspecified: Secondary | ICD-10-CM

## 2024-05-10 LAB — COMPREHENSIVE METABOLIC PANEL WITH GFR
ALT: 24 U/L (ref 0–44)
AST: 23 U/L (ref 15–41)
Albumin: 2.1 g/dL — ABNORMAL LOW (ref 3.5–5.0)
Alkaline Phosphatase: 80 U/L (ref 38–126)
Anion gap: 9 (ref 5–15)
BUN: 10 mg/dL (ref 8–23)
CO2: 24 mmol/L (ref 22–32)
Calcium: 8.2 mg/dL — ABNORMAL LOW (ref 8.9–10.3)
Chloride: 105 mmol/L (ref 98–111)
Creatinine, Ser: 0.68 mg/dL (ref 0.44–1.00)
GFR, Estimated: >60 mL/min (ref >60)
Glucose, Bld: 106 mg/dL — ABNORMAL HIGH (ref 70–99)
Potassium: 3.6 mmol/L (ref 3.5–5.1)
Sodium: 138 mmol/L (ref 135–145)
Total Bilirubin: 0.4 mg/dL (ref 0.0–1.2)
Total Protein: 5.5 g/dL — ABNORMAL LOW (ref 6.5–8.1)

## 2024-05-10 LAB — CBC
HCT: 28.8 % — ABNORMAL LOW (ref 36.0–46.0)
Hemoglobin: 9.2 g/dL — ABNORMAL LOW (ref 12.0–15.0)
MCH: 30.8 pg (ref 26.0–34.0)
MCHC: 31.9 g/dL (ref 30.0–36.0)
MCV: 96.3 fL (ref 80.0–100.0)
Platelets: 293 K/uL (ref 150–400)
RBC: 2.99 MIL/uL — ABNORMAL LOW (ref 3.87–5.11)
RDW: 13.3 % (ref 11.5–15.5)
WBC: 5.9 K/uL (ref 4.0–10.5)
nRBC: 0 % (ref 0.0–0.2)

## 2024-05-10 MED ORDER — CHLORHEXIDINE GLUCONATE CLOTH 2 % EX PADS
6.0000 | MEDICATED_PAD | Freq: Every day | CUTANEOUS | Status: DC
  Administered 2024-05-10: 6 via TOPICAL

## 2024-05-10 MED ORDER — SACCHAROMYCES BOULARDII 250 MG PO CAPS: 250.0000 mg | ORAL_CAPSULE | Freq: Two times a day (BID) | ORAL | Status: AC

## 2024-05-10 MED ORDER — OXYCODONE HCL 5 MG PO TABS: 5.0000 mg | ORAL_TABLET | ORAL | 0 refills | Status: AC | PRN

## 2024-05-10 MED ORDER — HYDROCHLOROTHIAZIDE 12.5 MG PO TABS
12.5000 mg | ORAL_TABLET | Freq: Every day | ORAL | Status: DC
  Administered 2024-05-10 – 2024-05-11 (×2): 12.5 mg via ORAL
  Filled 2024-05-10 (×2): qty 1

## 2024-05-10 NOTE — TOC Progression Note (Signed)
 Transition of Care [facility]) - Progression Note    Patient Details  Name: Kara Acevedo MRN: [id number] Date of Birth: Apr 02, 1956  [facility] CM/SW Contact  Freddy Kinne LITTIE [place], [place] Phone Number: 05/10/2024, 2:17 PM  Clinical Narrative:    CSW received insurance auth approval for [facility] effective 9/30-10/2/25 auth ID# [id number]. CSW spoke with facility, they will have a bed ready for pt tomorrow 10/1. CSW will continue to follow.   Expected Discharge Plan: Skilled Nursing Facility Barriers to Discharge: Continued Medical Work up, English as a second language teacher, SNF Pending bed offer               Expected Discharge Plan and Services In-house Referral: Clinical Social Work Discharge Planning Services: [employer] Consult Post Acute Care Choice: Skilled Nursing Facility Living arrangements for the past 2 months: Single Family Home Expected Discharge Date: 05/10/24               DME Arranged: N/A DME Agency: NA       HH Arranged: PT, OT HH Agency: [facility] Date [facility] Agency Contacted: 05/03/24 Time HH Agency Contacted: 1048 Representative spoke with at [facility] Agency: Darleene with [facility] aware of patient admission. [facility] will need new orders and face to face at discharge to continue services.   Social Drivers of Health (SDOH) Interventions SDOH Screenings   Food Insecurity: No Food Insecurity (05/02/2024)  Housing: Low Risk  (05/02/2024)  Transportation Needs: No Transportation Needs (05/02/2024)  Utilities: Not At Risk (05/02/2024)  Financial Resource Strain: Low Risk  (09/27/2021)   [facility]  Social Connections: Socially Integrated (05/02/2024)  Stress: No Stress Concern Present (09/27/2021)   Received from [facility]  Tobacco Use: Low Risk  (05/04/2024)  Recent Concern: Tobacco Use - Medium Risk (03/07/2024)   Received from [facility]    Readmission Risk Interventions     No data to display

## 2024-05-10 NOTE — Discharge Summary (Signed)
 Physician Discharge Summary  Kara Acevedo FMW:[id number] DOB: Jul 23, 1956 DOA: 05/02/2024  PCP: Renato Dorothey HERO, NP  Admit date: 05/02/2024 Discharge date: 05/10/2024  Admitted From: home Disposition:  home  Recommendations for Outpatient Follow-up:  Follow up with PCP in 1-2 weeks Follow up with ID as an outpatient Continue meropenem with end date 06/19/2024, likely needs suppressive oral antibiotics following IV completion, per ID as an outpatient  Home Health: none Equipment/Devices: none  Discharge Condition: stable CODE STATUS: Full code Diet Orders (From admission, onward)     Start     Ordered   05/04/24 1135  Diet regular Room service appropriate? Yes; Fluid consistency: Thin  Diet effective now       Question Answer Comment  Room service appropriate? Yes   Fluid consistency: Thin      05/04/24 1134            Brief Narrative / Interim history:  68 y.o. female with medical history significant of attention deficit hyper activity disorder, essential hypertension, history of bariatric surgery, GERD, history of periprosthetic fracture of the femur, osteoarthritis among other things who was admitted and had left total knee replacement on September 3.  Patient went home on the ninth.   Patient started noticing swelling and redness about 3 days ago.  It started a small point but spread across the suture line.  She discussed with her family and the daughter who is a Publishing rights manager who recommended the patient sees her PCP.  Will later she was evaluated by orthopedics and started on oral clindamycin through the weekend.  Patient has taken the clindamycin but did not feel better instead symptoms have gotten worse.    Hospital Course / Discharge diagnoses: Principal problem Surgical site infection - Ortho consulted, she was just admitted and discharged on 9/8 with left periprosthetic distal femur fracture, status post repair by Dr. Kendal on 9/3.  She has  hardware placed in that area.  Orthopedics and ID consulted.  She was taken to the OR by Dr. Kendal 9/24 status post I&D of the left knee seroma/infected hematoma.  Cultures grew Pseudomonas as well as Enterococcus, has been placed on meropenem.  Has a PICC line, continue antibiotics for 6 weeks with end date 11/9, likely needs long-term suppressive oral antibiotics, she will have follow-up with ID as an outpatient  Active problems  Essential hypertension -she has remained normotensive while hospitalized despite being off of her blood pressure medications at home, possibly in the setting of infection/immobility.  Continue to hold BP meds upon discharge, reevaluate as an outpatient and resume as indicated Hyponatremia-likely incidental and not real, 1 day her sodium dipped to 127, however felt not to be real since prior to that and on repeat values it was normal GERD - PPIs ADHD - Adderall   Obesity, class II-BMI 38.2.  She would benefit from weight loss  Sepsis ruled out   Discharge Instructions  Discharge Instructions     Home infusion instructions   Complete by: As directed    Instructions: Flushing of vascular access device: 0.9% NaCl pre/post medication administration and prn patency; Heparin 100 u/ml, 5ml for implanted ports and Heparin 10u/ml, 5ml for all other central venous catheters.      Allergies as of 05/10/2024       Reactions   Ace Inhibitors Other (See Comments), Cough   Uvula swelling   Doxycycline Nausea And Vomiting   Latex Other (See Comments)   Blisters   Sulfa Antibiotics  Rash   Fluconazole Other (See Comments)   Feels like a burning sensation from inside out   Nickel Swelling, Dermatitis   Blistering        Medication List     STOP taking these medications    amLODipine  5 MG tablet Commonly known as: NORVASC    hydrochlorothiazide  12.5 MG tablet Commonly known as: HYDRODIURIL    indapamide  2.5 MG tablet Commonly known as: LOZOL    potassium  chloride SA 20 MEQ tablet Commonly known as: KLOR-CON  M       TAKE these medications    amphetamine -dextroamphetamine  20 MG tablet Commonly known as: ADDERALL Take 20 mg by mouth daily.   apixaban  2.5 MG Tabs tablet Commonly known as: Eliquis  Take 1 tablet (2.5 mg total) by mouth 2 (two) times daily.   cyanocobalamin  1000 MCG/ML injection Commonly known as: VITAMIN B12   docusate sodium  100 MG capsule Commonly known as: Colace Take 1 capsule (100 mg total) by mouth 2 (two) times daily.   Ensure Max Protein Liqd Take 330 mLs (11 oz total) by mouth 2 (two) times daily.   hydrocortisone 2.5 % cream Apply 1 Application topically daily as needed (for rash).   ibuprofen  800 MG tablet Commonly known as: ADVIL  Take 800 mg by mouth daily as needed for moderate pain (pain score 4-6) or mild pain (pain score 1-3).   meropenem IVPB Commonly known as: MERREM Inject 1 g into the vein every 8 (eight) hours. Indication:  hardware associated wound infection First Dose: Yes Last Day of Therapy:  06/19/2024 Labs - Once weekly:  CBC/D and BMP, Labs - Once weekly: ESR and CRP   methocarbamol  500 MG tablet Commonly known as: ROBAXIN  Take 1 tablet (500 mg total) by mouth every 6 (six) hours as needed for muscle spasms.   multivitamin with minerals Tabs tablet Take 1 tablet by mouth daily.   ondansetron  4 MG tablet Commonly known as: ZOFRAN  Take 4 mg by mouth daily as needed for nausea or vomiting.   oxyCODONE  5 MG immediate release tablet Commonly known as: Oxy IR/ROXICODONE  Take 1 tablet (5 mg total) by mouth every 4 (four) hours as needed for severe pain (pain score 7-10). What changed: how much to take   saccharomyces boulardii 250 MG capsule Commonly known as: FLORASTOR Take 1 capsule (250 mg total) by mouth 2 (two) times daily.   Vitamin D  (Ergocalciferol ) 1.25 MG (50000 UNIT) Caps capsule Commonly known as: DRISDOL  Take 1 capsule (50,000 Units total) by mouth every 7  (seven) days.   Zepbound 2.5 MG/0.5ML Pen Generic drug: tirzepatide Inject 2.5 mg into the skin once a week. Wednesday               Home Infusion Instuctions  (From admission, onward)           Start     Ordered   05/09/24 0000  Home infusion instructions       Question:  Instructions  Answer:  Flushing of vascular access device: 0.9% NaCl pre/post medication administration and prn patency; Heparin 100 u/ml, 5ml for implanted ports and Heparin 10u/ml, 5ml for all other central venous catheters.   05/09/24 1017            Contact information for after-discharge care     Destination     [facility] [place], [place] .   Service: Skilled Nursing Contact information: [address] [place]  [address] [phone]  Consultations: Orthopedic surgery  ID  Procedures/Studies:  US  EKG SITE RITE Result Date: 05/07/2024 If Site Rite image not attached, placement could not be confirmed due to current cardiac rhythm.  DG C-Arm 1-60 Min-No Report Result Date: 05/04/2024 Fluoroscopy was utilized by the requesting physician.  No radiographic interpretation.   DG C-Arm 1-60 Min-No Report Result Date: 05/04/2024 Fluoroscopy was utilized by the requesting physician.  No radiographic interpretation.   DG Abd Portable 1V Result Date: 04/16/2024 CLINICAL DATA:  Constipation. EXAM: PORTABLE ABDOMEN - 1 VIEW COMPARISON:  None Available. FINDINGS: No abnormal bowel dilatation is noted. Moderate amount of stool seen throughout the colon. No abnormal calcifications. IMPRESSION: Moderate stool burden. No abnormal bowel dilatation. Electronically Signed   By: Lynwood Landy Raddle M.D.   On: 04/16/2024 12:48   DG FEMUR PORT MIN 2 VIEWS LEFT Result Date: 04/13/2024 CLINICAL DATA:  Fracture, postop. EXAM: LEFT FEMUR PORTABLE 2 VIEWS COMPARISON:  Preoperative imaging FINDINGS: Lateral plate and screw fixation of distal femur fracture. Improved  fracture alignment from preoperative imaging. Previous knee arthroplasty and proximal femoral hardware. Recent postsurgical change includes air and edema in the soft tissues. IMPRESSION: ORIF of distal femur fracture, in improved alignment from preoperative imaging. Electronically Signed   By: Andrea Gasman M.D.   On: 04/13/2024 15:03   DG FEMUR MIN 2 VIEWS LEFT Result Date: 04/13/2024 CLINICAL DATA:  Elective surgery. EXAM: LEFT FEMUR 2 VIEWS COMPARISON:  Preoperative imaging FINDINGS: Seven fluoroscopic spot views of the femur submitted from the operating room. Plate and screw fixation of distal femur fracture. Previous knee arthroplasty and proximal femoral hardware. Fluoroscopy time 56 seconds. Dose 6.61 mGy. IMPRESSION: Intraoperative fluoroscopy during ORIF of distal femur fracture. Electronically Signed   By: Andrea Gasman M.D.   On: 04/13/2024 13:39   DG C-Arm 1-60 Min-No Report Result Date: 04/13/2024 Fluoroscopy was utilized by the requesting physician.  No radiographic interpretation.   DG CHEST PORT 1 VIEW Result Date: 04/12/2024 CLINICAL DATA:  Preop chest exam.  Femur fracture. EXAM: PORTABLE CHEST 1 VIEW COMPARISON:  Radiograph 02/26/2024 FINDINGS: Stable heart size and mediastinal contours. Retrocardiac hiatal hernia. No focal airspace disease, pleural effusion, pulmonary edema or pneumothorax. No acute osseous abnormalities are seen. IMPRESSION: No active disease. Electronically Signed   By: Andrea Gasman M.D.   On: 04/12/2024 19:54   CT Angio Aortobifemoral W and/or Wo Contrast Result Date: 04/12/2024 CLINICAL DATA:  Leg trauma, knee fracture.  Diminished pulses. EXAM: CT ANGIOGRAPHY OF ABDOMINAL AORTA WITH ILIOFEMORAL RUNOFF TECHNIQUE: Multidetector CT imaging of the abdomen, pelvis and lower extremities was performed using the standard protocol during bolus administration of intravenous contrast. Multiplanar CT image reconstructions and MIPs were obtained to evaluate the vascular  anatomy. RADIATION DOSE REDUCTION: This exam was performed according to the departmental dose-optimization program which includes automated exposure control, adjustment of the mA and/or kV according to patient size and/or use of iterative reconstruction technique. CONTRAST:  OMNIPAQUE  IOHEXOL  350 MG/ML SOLN COMPARISON:  Left knee x-ray same day. FINDINGS: VASCULAR Aorta: Normal caliber aorta without aneurysm, dissection, vasculitis or significant stenosis. There is calcified atherosclerotic disease throughout the aorta. Celiac: Patent without evidence of aneurysm, dissection, vasculitis or significant stenosis. SMA: Patent without evidence of aneurysm, dissection, vasculitis or significant stenosis. Renals: Both renal arteries are patent without evidence of aneurysm, dissection, vasculitis, fibromuscular dysplasia or significant stenosis. IMA: Patent without evidence of aneurysm, dissection, vasculitis or significant stenosis. RIGHT Lower Extremity Inflow: Common, internal and external iliac arteries are patent  without evidence of aneurysm, dissection, vasculitis or significant stenosis. Outflow: Common, superficial and profunda femoral arteries and the popliteal artery are patent without evidence of aneurysm, dissection, vasculitis or significant stenosis. Limited evaluation of popliteal artery secondary to streak artifact from the knee. Runoff: Patent three vessel runoff to the ankle. LEFT Lower Extremity Inflow: Common, internal and external iliac arteries are patent without evidence of aneurysm, dissection, vasculitis or significant stenosis. Outflow: Common, superficial and profunda femoral arteries are patent without evidence of aneurysm, dissection, vasculitis or significant stenosis. There is limited evaluation of a portion of the popliteal artery secondary to streak artifact from the knee. Runoff: Patent three vessel runoff to the ankle. Veins: No obvious venous abnormality within the limitations of  this arterial phase study. Review of the MIP images confirms the above findings. NON-VASCULAR Lower chest: No acute abnormality. Hepatobiliary: Gallstones are likely present. No biliary ductal dilatation. Liver is within normal limits. Pancreas: Unremarkable. No pancreatic ductal dilatation or surrounding inflammatory changes. Spleen: Normal in size without focal abnormality. Adrenals/Urinary Tract: Adrenal glands are unremarkable. Kidneys are normal, without renal calculi, focal lesion, or hydronephrosis. Bladder is unremarkable. Stomach/Bowel: There surgical changes of the proximal stomach. Small hiatal hernia is present. Appendix appears normal. No evidence of bowel wall thickening, distention, or inflammatory changes. There is sigmoid colon diverticulosis. Lymphatic: No enlarged lymph nodes are seen. Reproductive: Uterus and bilateral adnexa are unremarkable. Other: No abdominal wall hernia or abnormality. No abdominopelvic ascites. Musculoskeletal: Degenerative changes affect the left hip and spine. Left-sided hip screw and intramedullary nail are present. Bilateral knee arthroplasties are present. There is an oblique acute comminuted fracture of the distal femur at the level of the prosthesis. There is 1 cm of posterolateral displacement of the distal fracture fragment. IMPRESSION: 1. No evidence for arterial injury. Note is made that there is limited evaluation of the level of the popliteal artery secondary to streak artifact from knee arthroplasty. 2. Acute comminuted fracture of the distal left femur at the level of the knee prosthesis. 3.  No acute localizing process in the abdomen or pelvis. 4. Cholelithiasis. 5. Sigmoid colon diverticulosis. Aortic Atherosclerosis (ICD10-I70.0). Electronically Signed   By: Greig Pique M.D.   On: 04/12/2024 17:48   DG Hip Unilat W or Wo Pelvis 2-3 Views Left Result Date: 04/12/2024 CLINICAL DATA:  Left knee pain after fall. EXAM: DG HIP (WITH OR WITHOUT PELVIS) 2-3V  LEFT COMPARISON:  February 24, 2022. FINDINGS: Status post surgical internal fixation proximal left femoral intrauterine fracture. No acute fracture or dislocation is noted. IMPRESSION: No acute abnormality seen. Electronically Signed   By: Lynwood Landy Raddle M.D.   On: 04/12/2024 12:10   DG Femur 1 View Left Result Date: 04/12/2024 CLINICAL DATA:  Left knee pain after fall. EXAM: LEFT FEMUR 1 VIEW COMPARISON:  February 24, 2022. FINDINGS: Status post surgical internal fixation of proximal left femoral fracture. Status post left total knee arthroplasty. Severely displaced and possibly comminuted periprosthetic fracture of distal left femur is noted. IMPRESSION: Severely displaced and possibly comminuted distal left femoral periprosthetic fracture. Electronically Signed   By: Lynwood Landy Raddle M.D.   On: 04/12/2024 12:08   DG Knee 1-2 Views Left Result Date: 04/12/2024 CLINICAL DATA:  Left knee pain after fall. EXAM: LEFT KNEE - 1-2 VIEW COMPARISON:  February 23, 2022. FINDINGS: Status post left total knee arthroplasty. Severely displaced and possibly comminuted periprosthetic fracture is seen involving the distal left femur. IMPRESSION: Severely displaced and possibly comminuted periprosthetic fracture involving distal  left femur. Electronically Signed   By: Lynwood Landy Raddle M.D.   On: 04/12/2024 12:05     Subjective: - no chest pain, shortness of breath, no abdominal pain, nausea or vomiting.   Discharge Exam: BP (!) 106/55 (BP Location: Left Arm)   Pulse 62   Temp 98.2 F (36.8 C) (Oral)   Resp 18   Ht 5' 5 (1.651 m)   Wt 104.3 kg   SpO2 96%   BMI 38.26 kg/m   General: Pt is alert, awake, not in acute distress Cardiovascular: RRR, S1/S2 +, no rubs, no gallops Respiratory: CTA bilaterally, no wheezing, no rhonchi Abdominal: Soft, NT, ND, bowel sounds + Extremities: no edema, no cyanosis    The results of significant diagnostics from this hospitalization (including imaging, microbiology, ancillary and  laboratory) are listed below for reference.     Microbiology: Recent Results (from the past 240 hours)  Blood culture (routine x 2)     Status: None   Collection Time: 05/02/24  5:40 PM   Specimen: BLOOD  Result Value Ref Range Status   Specimen Description BLOOD LEFT ANTECUBITAL  Final   Special Requests   Final    BOTTLES DRAWN AEROBIC AND ANAEROBIC Blood Culture adequate volume   Culture   Final    NO GROWTH 5 DAYS Performed at [facility], [address], [place], [place] [address]    Report Status 05/07/2024 FINAL  Final  Blood culture (routine x 2)     Status: None   Collection Time: 05/02/24  6:40 PM   Specimen: BLOOD  Result Value Ref Range Status   Specimen Description BLOOD RIGHT ANTECUBITAL  Final   Special Requests   Final    BOTTLES DRAWN AEROBIC AND ANAEROBIC Blood Culture results may not be optimal due to an inadequate volume of blood received in culture bottles   Culture   Final    NO GROWTH 5 DAYS Performed at [facility], [address] [place], [place] [address]    Report Status 05/07/2024 FINAL  Final  Aerobic/Anaerobic Culture w Gram Stain (surgical/deep wound)     Status: None   Collection Time: 05/04/24  9:25 AM   Specimen: Leg, Left; Tissue  Result Value Ref Range Status   Specimen Description TISSUE  Final   Special Requests LEFT KNEE INFECTION  Final   Gram Stain   Final    ABUNDANT WBC PRESENT, PREDOMINANTLY PMN NO ORGANISMS SEEN    Culture   Final    ABUNDANT ENTEROBACTER CLOACAE FEW PSEUDOMONAS AERUGINOSA NO ANAEROBES ISOLATED Performed at [facility], [address], [place], [place] [address]    Report Status 05/09/2024 FINAL  Final   Organism ID, Bacteria ENTEROBACTER CLOACAE  Final   Organism ID, Bacteria PSEUDOMONAS AERUGINOSA  Final      Susceptibility   Enterobacter cloacae - MIC*    CEFEPIME  <=0.12 SENSITIVE Sensitive     ERTAPENEM <=0.12 SENSITIVE Sensitive     CIPROFLOXACIN <=0.06 SENSITIVE Sensitive      GENTAMICIN <=1 SENSITIVE Sensitive     MEROPENEM <=0.25 SENSITIVE Sensitive     TRIMETH/SULFA <=20 SENSITIVE Sensitive     PIP/TAZO Value in next row Sensitive      <=4 SENSITIVEThis is a modified FDA-approved test that has been validated and its performance characteristics determined by the reporting laboratory.  This laboratory is certified under the Clinical Laboratory Improvement Amendments CLIA as qualified to perform high complexity clinical laboratory testing.    *  ABUNDANT ENTEROBACTER CLOACAE   Pseudomonas aeruginosa - MIC*    MEROPENEM Value in next row Sensitive      <=4 SENSITIVEThis is a modified FDA-approved test that has been validated and its performance characteristics determined by the reporting laboratory.  This laboratory is certified under the Clinical Laboratory Improvement Amendments CLIA as qualified to perform high complexity clinical laboratory testing.    CIPROFLOXACIN Value in next row Sensitive      <=4 SENSITIVEThis is a modified FDA-approved test that has been validated and its performance characteristics determined by the reporting laboratory.  This laboratory is certified under the Clinical Laboratory Improvement Amendments CLIA as qualified to perform high complexity clinical laboratory testing.    IMIPENEM Value in next row Sensitive      <=4 SENSITIVEThis is a modified FDA-approved test that has been validated and its performance characteristics determined by the reporting laboratory.  This laboratory is certified under the Clinical Laboratory Improvement Amendments CLIA as qualified to perform high complexity clinical laboratory testing.    CEFTAZIDIME/AVIBACTAM Value in next row Sensitive      <=4 SENSITIVEThis is a modified FDA-approved test that has been validated and its performance characteristics determined by the reporting laboratory.  This laboratory is certified under the Clinical Laboratory Improvement Amendments CLIA as qualified to perform high  complexity clinical laboratory testing.    CEFTOLOZANE/TAZOBACTAM Value in next row Sensitive      <=4 SENSITIVEThis is a modified FDA-approved test that has been validated and its performance characteristics determined by the reporting laboratory.  This laboratory is certified under the Clinical Laboratory Improvement Amendments CLIA as qualified to perform high complexity clinical laboratory testing.    TOBRAMYCIN  Value in next row Sensitive      <=4 SENSITIVEThis is a modified FDA-approved test that has been validated and its performance characteristics determined by the reporting laboratory.  This laboratory is certified under the Clinical Laboratory Improvement Amendments CLIA as qualified to perform high complexity clinical laboratory testing.    CEFTAZIDIME Value in next row Sensitive      <=4 SENSITIVEThis is a modified FDA-approved test that has been validated and its performance characteristics determined by the reporting laboratory.  This laboratory is certified under the Clinical Laboratory Improvement Amendments CLIA as qualified to perform high complexity clinical laboratory testing.    * FEW PSEUDOMONAS AERUGINOSA  Aerobic/Anaerobic Culture w Gram Stain (surgical/deep wound)     Status: None   Collection Time: 05/04/24  9:38 AM   Specimen: Leg, Left; Tissue  Result Value Ref Range Status   Specimen Description TISSUE  Final   Special Requests LEFT KNEE INFECTION  Final   Gram Stain   Final    ABUNDANT WBC PRESENT, PREDOMINANTLY PMN NO ORGANISMS SEEN    Culture   Final    RARE PSEUDOMONAS AERUGINOSA SUSCEPTIBILITIES PERFORMED ON PREVIOUS CULTURE WITHIN THE LAST 5 DAYS. RARE ENTEROBACTER CLOACAE NO ANAEROBES ISOLATED Performed at [facility], [address], [place], [place] [address]    Report Status 05/09/2024 FINAL  Final   Organism ID, Bacteria ENTEROBACTER CLOACAE  Final      Susceptibility   Enterobacter cloacae - MIC*    CEFEPIME  <=0.12 SENSITIVE Sensitive      ERTAPENEM <=0.12 SENSITIVE Sensitive     CIPROFLOXACIN <=0.06 SENSITIVE Sensitive     GENTAMICIN <=1 SENSITIVE Sensitive     MEROPENEM <=0.25 SENSITIVE Sensitive     TRIMETH/SULFA <=20 SENSITIVE Sensitive     PIP/TAZO Value in next row Sensitive      <=  4 SENSITIVEThis is a modified FDA-approved test that has been validated and its performance characteristics determined by the reporting laboratory.  This laboratory is certified under the Clinical Laboratory Improvement Amendments CLIA as qualified to perform high complexity clinical laboratory testing.    * RARE ENTEROBACTER CLOACAE     Labs: Basic Metabolic Panel: Recent Labs  Lab 05/05/24 0224 05/06/24 0503 05/08/24 0115 05/09/24 0114 05/09/24 0713 05/10/24 0040  NA 134* 137 127*  --  139 138  K 3.6 3.9 3.8  --  3.5 3.6  CL 106 103 96*  --  104 105  CO2 22 25 24   --  25 24  GLUCOSE 108* 100* 111*  --  92 106*  BUN 11 11 13   --  10 10  CREATININE 0.86 0.73 0.66 0.69 0.65 0.68  CALCIUM 7.9* 8.4* 7.5*  --  8.5* 8.2*  MG 1.7 1.8  --   --   --   --    Liver Function Tests: Recent Labs  Lab 05/05/24 0224 05/06/24 0503 05/10/24 0040  AST 41 33 23  ALT 28 31 24   ALKPHOS 78 83 80  BILITOT 0.9 0.6 0.4  PROT 5.3* 5.3* 5.5*  ALBUMIN  2.1* 2.0* 2.1*   CBC: Recent Labs  Lab 05/04/24 0239 05/05/24 0224 05/06/24 0503 05/08/24 0115 05/10/24 0040  WBC 6.5 7.3 6.3 6.4 5.9  HGB 10.0* 9.9* 9.7* 9.2* 9.2*  HCT 31.4* 31.3* 30.5* 28.8* 28.8*  MCV 97.8 97.5 97.1 96.6 96.3  PLT 298 284 268 284 293   CBG: No results for input(s): GLUCAP in the last 168 hours. Hgb A1c No results for input(s): HGBA1C in the last 72 hours. Lipid Profile No results for input(s): CHOL, HDL, LDLCALC, TRIG, CHOLHDL, LDLDIRECT in the last 72 hours. Thyroid function studies No results for input(s): TSH, T4TOTAL, T3FREE, THYROIDAB in the last 72 hours.  Invalid input(s): FREET3 Urinalysis    Component Value Date/Time    LABSPEC 1.015 03/21/2016 2008   PHURINE 6.5 03/21/2016 2008   GLUCOSEU NEGATIVE 03/21/2016 2008   HGBUR NEGATIVE 03/21/2016 2008   BILIRUBINUR NEGATIVE 03/21/2016 2008   KETONESUR NEGATIVE 03/21/2016 2008   PROTEINUR NEGATIVE 03/21/2016 2008   UROBILINOGEN 0.2 03/21/2016 2008   NITRITE NEGATIVE 03/21/2016 2008   LEUKOCYTESUR NEGATIVE 03/21/2016 2008    FURTHER DISCHARGE INSTRUCTIONS:   Get Medicines reviewed and adjusted: Please take all your medications with you for your next visit with your Primary MD   Laboratory/radiological data: Please request your Primary MD to go over all hospital tests and procedure/radiological results at the follow up, please ask your Primary MD to get all Hospital records sent to his/her office.   In some cases, they will be blood work, cultures and biopsy results pending at the time of your discharge. Please request that your primary care M.D. goes through all the records of your hospital data and follows up on these results.   Also Note the following: If you experience worsening of your admission symptoms, develop shortness of breath, life threatening emergency, suicidal or homicidal thoughts you must seek medical attention immediately by calling 911 or calling your MD immediately  if symptoms less severe.   You must read complete instructions/literature along with all the possible adverse reactions/side effects for all the Medicines you take and that have been prescribed to you. Take any new Medicines after you have completely understood and accpet all the possible adverse reactions/side effects.    Do not drive when taking Pain medications or  sleeping medications (Benzodaizepines)   Do not take more than prescribed Pain, Sleep and Anxiety Medications. It is not advisable to combine anxiety,sleep and pain medications without talking with your primary care practitioner   Special Instructions: If you have smoked or chewed Tobacco  in the last 2 yrs please  stop smoking, stop any regular Alcohol   and or any Recreational drug use.   Wear Seat belts while driving.   Please note: You were cared for by a hospitalist during your hospital stay. Once you are discharged, your primary care physician will handle any further medical issues. Please note that NO REFILLS for any discharge medications will be authorized once you are discharged, as it is imperative that you return to your primary care physician (or establish a relationship with a primary care physician if you do not have one) for your post hospital discharge needs so that they can reassess your need for medications and monitor your lab values.  Time coordinating discharge: 35 minutes  SIGNED:  Nilda Fendt, MD, PhD 05/10/2024, 10:20 AM

## 2024-05-10 NOTE — Progress Notes (Signed)
 Physical Therapy Treatment Patient Details Name: Kara Acevedo MRN: [id number] DOB: 1956-02-21 Today's Date: 05/10/2024   History of Present Illness Kara Acevedo is an 68 y.o. female who presents on 05/02/24 with swelling and redness L knee surgical site incision.  I & D of L knee 05/04/24. PMHx: HTN, ADHD, bariatric surgery, GERD, arthritis, R TKA, L TKA,  left hip IM nail (02/24/22), L periprosthetic distal femur fx with ORIF 04/13/24    PT Comments  Pt has made significant progress with mobility since evaluation with this therapist. She continues to have some drainage at surgical site incision. Cautious ROM performed and strengthening activities. Pt continues to have high fall risk with pain when WB'ing on L and thus a very low, shuffling R step. Worked on increased step height but pt limited by pain on L. Patient will benefit from continued inpatient follow up therapy, <3 hours/day. PT will continue to follow.     If plan is discharge home, recommend the following: Assistance with cooking/housework;Assist for transportation;Help with stairs or ramp for entrance   Can travel by private vehicle     Yes  Equipment Recommendations  None recommended by PT    Recommendations for Other Services       Precautions / Restrictions Precautions Precautions: Fall Recall of Precautions/Restrictions: Intact Restrictions Weight Bearing Restrictions Per Provider Order: No LLE Weight Bearing Per Provider Order: Weight bearing as tolerated     Mobility  Bed Mobility Overal bed mobility: Needs Assistance Bed Mobility: Supine to Sit, Sit to Supine     Supine to sit: Contact guard Sit to supine: Contact guard assist   General bed mobility comments: has begun today being able to lift LLE up into bed without using leg lifter    Transfers Overall transfer level: Needs assistance Equipment used: Rolling walker (2 wheels) Transfers: Sit to/from Stand Sit to Stand:  Contact guard assist           General transfer comment: CGA for safety and stability    Ambulation/Gait Ambulation/Gait assistance: Contact guard assist Gait Distance (Feet): 125 Feet Assistive device: Rolling walker (2 wheels) Gait Pattern/deviations: Step-to pattern, Decreased stance time - left, Decreased step length - left, Decreased step length - right, Antalgic, Knee flexed in stance - left Gait velocity: decreased Gait velocity interpretation: <1.8 ft/sec, indicate of risk for recurrent falls   General Gait Details: pt with very low, small R step with foot in inversion. Seems to do this to minimize time and wt on on LLE but cued to attempt to begin picking up R foot due to fall risk. Was able to tolerate for a few steps.   Stairs             Wheelchair Mobility     Tilt Bed    Modified Rankin (Stroke Patients Only)       Balance Overall balance assessment: Needs assistance Sitting-balance support: No upper extremity supported, Feet supported Sitting balance-Leahy Scale: Good     Standing balance support: Single extremity supported, During functional activity Standing balance-Leahy Scale: Poor Standing balance comment: needs at least unilateral UE support                            Communication Communication Communication: No apparent difficulties  Cognition Arousal: Alert Behavior During Therapy: WFL for tasks assessed/performed   PT - Cognitive impairments: No apparent impairments  PT - Cognition Comments: able to direct care and follow all instructions Following commands: Intact      Cueing Cueing Techniques: Verbal cues  Exercises General Exercises - Lower Extremity Long Arc Quad: AROM, Left, 10 reps, Seated Heel Slides: AROM, Left, 10 reps, Seated    General Comments General comments (skin integrity, edema, etc.): VSS      Pertinent Vitals/Pain Pain Assessment Pain Assessment: 0-10 Faces  Pain Scale: Hurts little more Pain Location: L lateral knee Pain Descriptors / Indicators: Discomfort, Grimacing, Guarding Pain Intervention(s): Limited activity within patient's tolerance, Monitored during session, Premedicated before session    Home Living                          Prior Function            PT Goals (current goals can now be found in the care plan section) Acute Rehab PT Goals Patient Stated Goal: get better PT Goal Formulation: With patient Time For Goal Achievement: 05/17/24 Potential to Achieve Goals: Good Progress towards PT goals: Progressing toward goals    Frequency    Min 2X/week      PT Plan      Co-evaluation              AM-PAC PT 6 Clicks Mobility   Outcome Measure  Help needed turning from your back to your side while in a flat bed without using bedrails?: None Help needed moving from lying on your back to sitting on the side of a flat bed without using bedrails?: A Little Help needed moving to and from a bed to a chair (including a wheelchair)?: A Little Help needed standing up from a chair using your arms (e.g., wheelchair or bedside chair)?: A Little Help needed to walk in hospital room?: A Lot (+2 with chair follow) Help needed climbing 3-5 steps with a railing? : Total 6 Click Score: 16    End of Session Equipment Utilized During Treatment: Gait belt Activity Tolerance: Patient tolerated treatment well Patient left: in bed;with call bell/phone within reach Nurse Communication: Mobility status PT Visit Diagnosis: Pain;History of falling (Z91.81);Difficulty in walking, not elsewhere classified (R26.2) Pain - Right/Left: Left Pain - part of body: Leg     Time: 8940-8879 PT Time Calculation (min) (ACUTE ONLY): 21 min  Charges:    $Gait Training: 8-22 mins PT General Charges $$ ACUTE PT VISIT: 1 Visit                     Richerd Lipoma, PT  Acute Rehab Services Secure chat preferred Office  [phone]    Richerd CROME Johathan Province 05/10/2024, 1:17 PM

## 2024-05-11 MED ORDER — HEPARIN SOD (PORK) LOCK FLUSH 100 UNIT/ML IV SOLN
250.0000 [IU] | INTRAVENOUS | Status: AC | PRN
  Administered 2024-05-11: 250 [IU]

## 2024-05-11 NOTE — Progress Notes (Signed)
 Ortho Note  Patient seen earlier today. Having some serosangious drainage from her incision. Will plan for incisional vac and close outpatient follow up early next week. Continue IV antibiotics. Okay for DC from ortho standpoint.  [name], MD [facility] [phone] (office) [email or web address]

## 2024-05-11 NOTE — Progress Notes (Signed)
 Tried to call report to RN at [facility], no answer.

## 2024-05-11 NOTE — Plan of Care (Signed)
  Problem: Education: Goal: Knowledge of General Education information will improve Description: Including pain rating scale, medication(s)/side effects and non-pharmacologic comfort measures Outcome: Progressing   Problem: Health Behavior/Discharge Planning: Goal: Ability to manage health-related needs will improve Outcome: Progressing   Problem: Clinical Measurements: Goal: Will remain free from infection Outcome: Progressing   Problem: Activity: Goal: Risk for activity intolerance will decrease Outcome: Progressing   Problem: Pain Managment: Goal: General experience of comfort will improve and/or be controlled Outcome: Progressing

## 2024-05-11 NOTE — TOC Transition Note (Signed)
 Transition of Care [facility]) - Discharge Note   Patient Details  Name: Kara Acevedo MRN: [id number] Date of Birth: [birth year]  Transition of Care [facility]) CM/SW Contact:  Jeoffrey LITTIE Maranda ISRAEL Phone Number: 05/11/2024, 10:51 AM   Clinical Narrative:    Patient will DC to: [facility] Anticipated DC date: 05/11/24 Family notified: Yes Transport by: Private Vehicle   Per MD patient ready for DC to [facility]. RN to call report prior to discharge [phone] Room 204B. RN, patient, patient's family, and facility notified of DC. Discharge Summary and FL2 sent to facility. Pt son in law to transport pt to facility.  CSW will sign off for now as social work intervention is no longer needed. Please consult us  again if new needs arise.  Late entry 10/2 11:23 AM [facility] notified CSW that SNF transfer report was not received. CSW sent SNF transfer report through the HUB.   CSW will sign off for now as social work intervention is no longer needed.  Final next level of care: Skilled Nursing Facility Barriers to Discharge: Barriers Resolved   Patient Goals and CMS Choice Patient states their goals for this hospitalization and ongoing recovery are:: SNF          Discharge Placement   Existing PASRR number confirmed : 05/11/24          Patient chooses bed at: [facility] Patient to be transferred to facility by: Private vehicle- Son in law to transport to facility   Patient and family notified of of transfer: 05/11/24  Discharge Plan and Services Additional resources added to the After Visit Summary for   In-house Referral: Clinical Social Work Discharge Planning Services: CM Consult Post Acute Care Choice: Skilled Nursing Facility          DME Arranged: N/A DME Agency: NA       HH Arranged: PT, OT HH Agency: [facility] Date [facility] Agency Contacted: 05/03/24 Time HH Agency Contacted: [phone] Representative spoke with at [facility] Agency: Darleene with  [facility] aware of patient admission. [facility] will need new orders and face to face at discharge to continue services.  Social Drivers of Health (SDOH) Interventions SDOH Screenings   Food Insecurity: No Food Insecurity (05/02/2024)  Housing: Low Risk  (05/02/2024)  Transportation Needs: No Transportation Needs (05/02/2024)  Utilities: Not At Risk (05/02/2024)  Financial Resource Strain: Low Risk  (09/27/2021)   Received from [facility]  Social Connections: Socially Integrated (05/02/2024)  Stress: No Stress Concern Present (09/27/2021)   Received from [facility]  Tobacco Use: Low Risk  (05/04/2024)  Recent Concern: Tobacco Use - Medium Risk (03/07/2024)   Received from [facility]     Readmission Risk Interventions     No data to display

## 2024-05-11 NOTE — Progress Notes (Signed)
 Physical Therapy Treatment Patient Details Name: Kara Acevedo MRN: [id number] DOB: 1955-11-08 Today's Date: 05/11/2024   History of Present Illness Kara Acevedo is an 68 y.o. female who presents on 05/02/24 with swelling and redness L knee surgical site incision.  I & D of L knee 05/04/24. PMHx: HTN, ADHD, bariatric surgery, GERD, arthritis, R TKA, L TKA,  left hip IM nail (02/24/22), L periprosthetic distal femur fx with ORIF 04/13/24    PT Comments  Pt making excellent progress towards her physical therapy goals and is motivated to participate. Continues to have BLE edema, LLE > RLE; education provided regarding elevation, ankle pumps and provided ice packs. Pt performed seated exercises and ambulating limited hallway distances with a RW and step through vs step to pattern. Reviewed techniques for stair negotiation, however, pt doesn't quite feel confident or strong enough yet to perform. Will continue to follow acutely.   If plan is discharge home, recommend the following: Assistance with cooking/housework;Assist for transportation;Help with stairs or ramp for entrance   Can travel by private vehicle     Yes  Equipment Recommendations  None recommended by PT    Recommendations for Other Services       Precautions / Restrictions Precautions Precautions: Fall Recall of Precautions/Restrictions: Intact Restrictions Weight Bearing Restrictions Per Provider Order: No LLE Weight Bearing Per Provider Order: Weight bearing as tolerated     Mobility  Bed Mobility               General bed mobility comments: OOB in chair upon entry    Transfers Overall transfer level: Needs assistance Equipment used: Rolling walker (2 wheels) Transfers: Sit to/from Stand Sit to Stand: Supervision                Ambulation/Gait Ambulation/Gait assistance: Supervision Gait Distance (Feet): 75 Feet (75, 75) Assistive device: Rolling walker (2 wheels) Gait  Pattern/deviations: Step-to pattern, Decreased stance time - left, Decreased step length - right, Antalgic, Knee flexed in stance - left, Step-through pattern Gait velocity: decreased     General Gait Details: Verbal cues for smaller L step length, pt with decreased R foot clearance, particularly with increased distance and fatigue. Moderate reliance through arms on RW.   Stairs             Wheelchair Mobility     Tilt Bed    Modified Rankin (Stroke Patients Only)       Balance Overall balance assessment: Needs assistance Sitting-balance support: No upper extremity supported, Feet supported Sitting balance-Leahy Scale: Good     Standing balance support: Single extremity supported, During functional activity Standing balance-Leahy Scale: Poor Standing balance comment: needs at least unilateral UE support                            Communication Communication Communication: No apparent difficulties  Cognition Arousal: Alert Behavior During Therapy: WFL for tasks assessed/performed   PT - Cognitive impairments: No apparent impairments                       PT - Cognition Comments: able to direct care and follow all instructions Following commands: Intact      Cueing Cueing Techniques: Verbal cues  Exercises General Exercises - Lower Extremity Long Arc Quad: AROM, Left, 10 reps, Seated Heel Slides: AROM, Left, 10 reps, Seated    General Comments        Pertinent Vitals/Pain Pain  Assessment Pain Assessment: Faces Faces Pain Scale: Hurts little more Pain Location: L lateral knee Pain Descriptors / Indicators: Discomfort, Grimacing, Guarding Pain Intervention(s): Monitored during session, Other (comment) (provided ice pack)    Home Living                          Prior Function            PT Goals (current goals can now be found in the care plan section) Acute Rehab PT Goals Patient Stated Goal: get better PT Goal  Formulation: With patient Time For Goal Achievement: 05/17/24 Potential to Achieve Goals: Good Progress towards PT goals: Progressing toward goals    Frequency    Min 2X/week      PT Plan      Co-evaluation              AM-PAC PT 6 Clicks Mobility   Outcome Measure  Help needed turning from your back to your side while in a flat bed without using bedrails?: None Help needed moving from lying on your back to sitting on the side of a flat bed without using bedrails?: A Little Help needed moving to and from a bed to a chair (including a wheelchair)?: A Little Help needed standing up from a chair using your arms (e.g., wheelchair or bedside chair)?: A Little Help needed to walk in hospital room?: A Little Help needed climbing 3-5 steps with a railing? : A Lot 6 Click Score: 18    End of Session Equipment Utilized During Treatment: Gait belt Activity Tolerance: Patient tolerated treatment well Patient left: in bed;with call bell/phone within reach Nurse Communication: Mobility status PT Visit Diagnosis: Pain;History of falling (Z91.81);Difficulty in walking, not elsewhere classified (R26.2) Pain - Right/Left: Left Pain - part of body: Leg     Time: 0853-0920 PT Time Calculation (min) (ACUTE ONLY): 27 min  Charges:    $Gait Training: 8-22 mins $Therapeutic Activity: 8-22 mins PT General Charges $$ ACUTE PT VISIT: 1 Visit                     Aleck Daring, PT, DPT Acute Rehabilitation Services Office [phone]    Aleck ONEIDA Daring 05/11/2024, 9:29 AM

## 2024-05-11 NOTE — Plan of Care (Signed)

## 2024-05-11 NOTE — Discharge Instructions (Addendum)
 [facility] Discharge Instructions   General Discharge Instructions  WEIGHT BEARING STATUS:WEIGHTBEARING AS TOLERATED  RANGE OF MOTION/ACTIVITY: OK FOR KNEE MOTION  Wound Care: Keep wound vac in place until follow up  DVT/PE prophylaxis: Eliquis  2.5 mg twice daily x 30 days  Diet: as you were eating previously.  Can use over the counter stool softeners and bowel preparations, such as Miralax , to help with bowel movements.  Narcotics can be constipating.  Be sure to drink plenty of fluids  PAIN MEDICATION USE AND EXPECTATIONS  You have likely been given narcotic medications to help control your pain.  After a traumatic event that results in an fracture (broken bone) with or without surgery, it is ok to use narcotic pain medications to help control one's pain.  We understand that everyone responds to pain differently and each individual patient will be evaluated on a regular basis for the continued need for narcotic medications. Ideally, narcotic medication use should last no more than 6-8 weeks (coinciding with fracture healing).   As a patient it is your responsibility as well to monitor narcotic medication use and report the amount and frequency you use these medications when you come to your office visit.   We would also advise that if you are using narcotic medications, you should take a dose prior to therapy to maximize you participation.  IF YOU ARE ON NARCOTIC MEDICATIONS IT IS NOT PERMISSIBLE TO OPERATE A MOTOR VEHICLE (MOTORCYCLE/CAR/TRUCK/MOPED) OR HEAVY MACHINERY DO NOT MIX NARCOTICS WITH OTHER CNS (CENTRAL NERVOUS SYSTEM) DEPRESSANTS SUCH AS ALCOHOL   POST-OPERATIVE OPIOID TAPER INSTRUCTIONS: It is important to wean off of your opioid medication as soon as possible. If you do not need pain medication after your surgery it is ok to stop day one. Opioids include: Codeine, Hydrocodone (Norco, Vicodin), Oxycodone (Percocet, oxycontin ) and hydromorphone  amongst others.   Long term and even short term use of opiods can cause: Increased pain response Dependence Constipation Depression Respiratory depression And more.  Withdrawal symptoms can include Flu like symptoms Nausea, vomiting And more Techniques to manage these symptoms Hydrate well Eat regular healthy meals Stay active Use relaxation techniques(deep breathing, meditating, yoga) Do Not substitute Alcohol  to help with tapering If you have been on opioids for less than two weeks and do not have pain than it is ok to stop all together.  Plan to wean off of opioids This plan should start within one week post op of your fracture surgery  Maintain the same interval or time between taking each dose and first decrease the dose.  Cut the total daily intake of opioids by one tablet each day Next start to increase the time between doses. The last dose that should be eliminated is the evening dose.    STOP SMOKING OR USING NICOTINE PRODUCTS!!!!  As discussed nicotine severely impairs your body's ability to heal surgical and traumatic wounds but also impairs bone healing.  Wounds and bone heal by forming microscopic blood vessels (angiogenesis) and nicotine is a vasoconstrictor (essentially, shrinks blood vessels).  Therefore, if vasoconstriction occurs to these microscopic blood vessels they essentially disappear and are unable to deliver necessary nutrients to the healing tissue.  This is one modifiable factor that you can do to dramatically increase your chances of healing your injury.  (This means no smoking, no nicotine gum, patches, etc)  DO NOT USE NONSTEROIDAL ANTI-INFLAMMATORY DRUGS (NSAID'S)  Using products such as Advil  (ibuprofen ), Aleve (naproxen), Motrin  (ibuprofen ) for additional pain control during fracture healing can delay and/or  prevent the healing response.  If you would like to take over the counter (OTC) medication, Tylenol  (acetaminophen ) is ok.  However, some narcotic medications that  are given for pain control contain acetaminophen  as well. Therefore, you should not exceed more than 4000 mg of tylenol  in a day if you do not have liver disease.  Also note that there are may OTC medicines, such as cold medicines and allergy medicines that my contain tylenol  as well.  If you have any questions about medications and/or interactions please ask your doctor/PA or your pharmacist.      ICE AND ELEVATE INJURED/OPERATIVE EXTREMITY  Using ice and elevating the injured extremity above your heart can help with swelling and pain control.  Icing in a pulsatile fashion, such as 20 minutes on and 20 minutes off, can be followed.    Do not place ice directly on skin. Make sure there is a barrier between to skin and the ice pack.    Using frozen items such as frozen peas works well as the conform nicely to the are that needs to be iced.  USE AN ACE WRAP OR TED HOSE FOR SWELLING CONTROL  In addition to icing and elevation, Ace wraps or TED hose are used to help limit and resolve swelling.  It is recommended to use Ace wraps or TED hose until you are informed to stop.    When using Ace Wraps start the wrapping distally (farthest away from the body) and wrap proximally (closer to the body)   Example: If you had surgery on your leg or thing and you do not have a splint on, start the ace wrap at the toes and work your way up to the thigh        If you had surgery on your upper extremity and do not have a splint on, start the ace wrap at your fingers and work your way up to the upper arm   CALL THE OFFICE FOR MEDICATION REFILLS OR WITH ANY QUESTIONS/CONCERNS: [phone]   VISIT OUR WEBSITE FOR ADDITIONAL INFORMATION: [email or web address]      Call office for the following: Temperature greater than 101F Persistent nausea and vomiting Severe uncontrolled pain Redness, tenderness, or signs of infection (pain, swelling, redness, odor or green/yellow discharge around the site) Difficulty breathing,  headache or visual disturbances Hives Persistent dizziness or light-headedness Extreme fatigue Any other questions or concerns you may have after discharge  In an emergency, call 911 or go to an Emergency Department at a nearby hospital  OTHER HELPFUL INFORMATION  If you had a block, it will wear off between 8-24 hrs postop typically.  This is period when your pain may go from nearly zero to the pain you would have had postop without the block.  This is an abrupt transition but nothing dangerous is happening.  You may take an extra dose of narcotic when this happens.  You should wean off your narcotic medicines as soon as you are able.  Most patients will be off or using minimal narcotics before their first postop appointment.   We suggest you use the pain medication the first night prior to going to bed, in order to ease any pain when the anesthesia wears off. You should avoid taking pain medications on an empty stomach as it will make you nauseous.  Do not drink alcoholic beverages or take illicit drugs when taking pain medications.  In most states it is against the law to drive while you are in a  splint or sling.  And certainly against the law to drive while taking narcotics.  You may return to work/school in the next couple of days when you feel up to it.   Pain medication may make you constipated.  Below are a few solutions to try in this order: Decrease the amount of pain medication if you aren't having pain. Drink lots of decaffeinated fluids. Drink prune juice and/or each dried prunes  If the first 3 don't work start with additional solutions Take Colace - an over-the-counter stool softener Take Senokot - an over-the-counter laxative Take Miralax  - a stronger over-the-counter laxative

## 2024-05-11 NOTE — Plan of Care (Signed)
  Problem: Education: Goal: Knowledge of General Education information will improve Description: Including pain rating scale, medication(s)/side effects and non-pharmacologic comfort measures 05/11/2024 1420 by Pati Thinnes K, RN Outcome: Adequate for Discharge 05/11/2024 1420 by Dimitrious Micciche K, RN Outcome: Progressing   Problem: Health Behavior/Discharge Planning: Goal: Ability to manage health-related needs will improve 05/11/2024 1420 by [place] Throckmorton K, RN Outcome: Adequate for Discharge 05/11/2024 1420 by Peighton Edgin K, RN Outcome: Progressing   Problem: Clinical Measurements: Goal: Ability to maintain clinical measurements within normal limits will improve 05/11/2024 1420 by Ronnika Collett K, RN Outcome: Adequate for Discharge 05/11/2024 1420 by Florian Dellis POUR, RN Outcome: Progressing Goal: Will remain free from infection 05/11/2024 1420 by Florian Dellis POUR, RN Outcome: Adequate for Discharge 05/11/2024 1420 by Florian Dellis POUR, RN Outcome: Progressing Goal: Diagnostic test results will improve 05/11/2024 1420 by Florian Dellis POUR, RN Outcome: Adequate for Discharge 05/11/2024 1420 by Florian Dellis POUR, RN Outcome: Progressing Goal: Respiratory complications will improve 05/11/2024 1420 by Florian Dellis POUR, RN Outcome: Adequate for Discharge 05/11/2024 1420 by Florian Dellis POUR, RN Outcome: Progressing Goal: Cardiovascular complication will be avoided 05/11/2024 1420 by Jakiah Goree K, RN Outcome: Adequate for Discharge 05/11/2024 1420 by Hanley Rispoli K, RN Outcome: Progressing   Problem: Activity: Goal: Risk for activity intolerance will decrease 05/11/2024 1420 by Alexandra Lipps K, RN Outcome: Adequate for Discharge 05/11/2024 1420 by Florian Dellis POUR, RN Outcome: Progressing   Problem: Nutrition: Goal: Adequate nutrition will be maintained 05/11/2024 1420 by Londen Bok K, RN Outcome: Adequate for Discharge 05/11/2024 1420 by Florian Dellis POUR, RN Outcome:  Progressing   Problem: Coping: Goal: Level of anxiety will decrease 05/11/2024 1420 by Antwan Bribiesca K, RN Outcome: Adequate for Discharge 05/11/2024 1420 by Florian Dellis POUR, RN Outcome: Progressing   Problem: Elimination: Goal: Will not experience complications related to bowel motility 05/11/2024 1420 by Imelda Dandridge K, RN Outcome: Adequate for Discharge 05/11/2024 1420 by Florian Dellis POUR, RN Outcome: Progressing Goal: Will not experience complications related to urinary retention 05/11/2024 1420 by Zachariah Pavek K, RN Outcome: Adequate for Discharge 05/11/2024 1420 by Florian Dellis POUR, RN Outcome: Progressing   Problem: Pain Managment: Goal: General experience of comfort will improve and/or be controlled 05/11/2024 1420 by Marlean Mortell K, RN Outcome: Adequate for Discharge 05/11/2024 1420 by Florian Dellis POUR, RN Outcome: Progressing   Problem: Safety: Goal: Ability to remain free from injury will improve 05/11/2024 1420 by Nefi Musich K, RN Outcome: Adequate for Discharge 05/11/2024 1420 by Florian Dellis POUR, RN Outcome: Progressing   Problem: Skin Integrity: Goal: Risk for impaired skin integrity will decrease 05/11/2024 1420 by Kedar Sedano K, RN Outcome: Adequate for Discharge 05/11/2024 1420 by Laytoya Ion K, RN Outcome: Progressing

## 2024-05-11 NOTE — Discharge Summary (Addendum)
 Physician Discharge Summary  Kara Acevedo FMW:[id number] DOB: 1956-07-31 DOA: 05/02/2024  PCP: Renato Dorothey HERO, NP  Admit date: 05/02/2024 Discharge date: 05/11/2024  Admitted From: home Disposition:  home  Recommendations for Outpatient Follow-up:  Follow up with PCP in 1-2 weeks Follow up with ID as an outpatient Continue meropenem with end date 06/19/2024, likely needs suppressive oral antibiotics following IV completion, per ID as an outpatient  Home Health: none Equipment/Devices: none  Discharge Condition: stable CODE STATUS: Full code Diet Orders (From admission, onward)     Start     Ordered   05/04/24 1135  Diet regular Room service appropriate? Yes; Fluid consistency: Thin  Diet effective now       Question Answer Comment  Room service appropriate? Yes   Fluid consistency: Thin      05/04/24 1134            Brief Narrative / Interim history:  68 y.o. female with medical history significant of attention deficit hyper activity disorder, essential hypertension, history of bariatric surgery, GERD, history of periprosthetic fracture of the femur, osteoarthritis among other things who was admitted and had left total knee replacement on September 3.  Patient went home on the ninth.   Patient started noticing swelling and redness about 3 days ago.  It started a small point but spread across the suture line.  She discussed with her family and the daughter who is a Publishing rights manager who recommended the patient sees her PCP.  Will later she was evaluated by orthopedics and started on oral clindamycin through the weekend.  Patient has taken the clindamycin but did not feel better instead symptoms have gotten worse.    Hospital Course / Discharge diagnoses: Principal problem Surgical site infection - Ortho consulted, she was just admitted and discharged on 9/8 with left periprosthetic distal femur fracture, status post repair by Dr. Kendal on 9/3.  She has  hardware placed in that area.  Orthopedics and ID consulted.  She was taken to the OR by Dr. Kendal 9/24 status post I&D of the left knee seroma/infected hematoma.  Cultures grew Pseudomonas as well as Enterococcus, has been placed on meropenem.  Has a PICC line, continue antibiotics for 6 weeks with end date 11/9, likely needs long-term suppressive oral antibiotics, she will have follow-up with ID as an outpatient  Active problems  Essential hypertension -she has remained normotensive while hospitalized despite being off of her blood pressure medications at home, possibly in the setting of infection/immobility.  Continue to hold BP meds upon discharge, reevaluate as an outpatient and resume as indicated Hyponatremia-likely incidental and not real, 1 day her sodium dipped to 127, however felt not to be real since prior to that and on repeat values it was normal GERD - PPIs ADHD - Adderall   Obesity, class II-BMI 38.2.  She would benefit from weight loss  Sepsis ruled out   Discharge Instructions  Discharge Instructions     Home infusion instructions   Complete by: As directed    Instructions: Flushing of vascular access device: 0.9% NaCl pre/post medication administration and prn patency; Heparin 100 u/ml, 5ml for implanted ports and Heparin 10u/ml, 5ml for all other central venous catheters.      Allergies as of 05/11/2024       Reactions   Ace Inhibitors Other (See Comments), Cough   Uvula swelling   Doxycycline Nausea And Vomiting   Latex Other (See Comments)   Blisters   Sulfa Antibiotics  Rash   Fluconazole Other (See Comments)   Feels like a burning sensation from inside out   Nickel Swelling, Dermatitis   Blistering        Medication List     STOP taking these medications    amLODipine  5 MG tablet Commonly known as: NORVASC    hydrochlorothiazide  12.5 MG tablet Commonly known as: HYDRODIURIL    indapamide  2.5 MG tablet Commonly known as: LOZOL    potassium  chloride SA 20 MEQ tablet Commonly known as: KLOR-CON  M       TAKE these medications    amphetamine -dextroamphetamine  20 MG tablet Commonly known as: ADDERALL Take 20 mg by mouth daily.   apixaban  2.5 MG Tabs tablet Commonly known as: Eliquis  Take 1 tablet (2.5 mg total) by mouth 2 (two) times daily.   cyanocobalamin  1000 MCG/ML injection Commonly known as: VITAMIN B12   docusate sodium  100 MG capsule Commonly known as: Colace Take 1 capsule (100 mg total) by mouth 2 (two) times daily.   Ensure Max Protein Liqd Take 330 mLs (11 oz total) by mouth 2 (two) times daily.   hydrocortisone 2.5 % cream Apply 1 Application topically daily as needed (for rash).   ibuprofen  800 MG tablet Commonly known as: ADVIL  Take 800 mg by mouth daily as needed for moderate pain (pain score 4-6) or mild pain (pain score 1-3).   meropenem IVPB Commonly known as: MERREM Inject 1 g into the vein every 8 (eight) hours. Indication:  hardware associated wound infection First Dose: Yes Last Day of Therapy:  06/19/2024 Labs - Once weekly:  CBC/D and BMP, Labs - Once weekly: ESR and CRP   methocarbamol  500 MG tablet Commonly known as: ROBAXIN  Take 1 tablet (500 mg total) by mouth every 6 (six) hours as needed for muscle spasms.   multivitamin with minerals Tabs tablet Take 1 tablet by mouth daily.   ondansetron  4 MG tablet Commonly known as: ZOFRAN  Take 4 mg by mouth daily as needed for nausea or vomiting.   oxyCODONE  5 MG immediate release tablet Commonly known as: Oxy IR/ROXICODONE  Take 1 tablet (5 mg total) by mouth every 4 (four) hours as needed for severe pain (pain score 7-10). What changed: how much to take   saccharomyces boulardii 250 MG capsule Commonly known as: FLORASTOR Take 1 capsule (250 mg total) by mouth 2 (two) times daily.   Vitamin D  (Ergocalciferol ) 1.25 MG (50000 UNIT) Caps capsule Commonly known as: DRISDOL  Take 1 capsule (50,000 Units total) by mouth every 7  (seven) days.   Zepbound 2.5 MG/0.5ML Pen Generic drug: tirzepatide Inject 2.5 mg into the skin once a week. Wednesday               Home Infusion Instuctions  (From admission, onward)           Start     Ordered   05/09/24 0000  Home infusion instructions       Question:  Instructions  Answer:  Flushing of vascular access device: 0.9% NaCl pre/post medication administration and prn patency; Heparin 100 u/ml, 5ml for implanted ports and Heparin 10u/ml, 5ml for all other central venous catheters.   05/09/24 1017            Contact information for follow-up providers     Haddix, Franky SQUIBB, MD. Call in 2 week(s).   Specialty: Orthopedic Surgery Contact information: [address] [place] [address] [phone]              Contact  information for after-discharge care     Destination     [facility] [place], [place] .   Service: Skilled Nursing Contact information: [address] [place]  [phone]                     Consultations: Orthopedic surgery  ID  Procedures/Studies:  US  EKG SITE RITE Result Date: 05/07/2024 If Site Rite image not attached, placement could not be confirmed due to current cardiac rhythm.  DG C-Arm 1-60 Min-No Report Result Date: 05/04/2024 Fluoroscopy was utilized by the requesting physician.  No radiographic interpretation.   DG C-Arm 1-60 Min-No Report Result Date: 05/04/2024 Fluoroscopy was utilized by the requesting physician.  No radiographic interpretation.   DG Abd Portable 1V Result Date: 04/16/2024 CLINICAL DATA:  Constipation. EXAM: PORTABLE ABDOMEN - 1 VIEW COMPARISON:  None Available. FINDINGS: No abnormal bowel dilatation is noted. Moderate amount of stool seen throughout the colon. No abnormal calcifications. IMPRESSION: Moderate stool burden. No abnormal bowel dilatation. Electronically Signed   By: Lynwood Landy Raddle M.D.   On: 04/16/2024 12:48   DG FEMUR PORT  MIN 2 VIEWS LEFT Result Date: 04/13/2024 CLINICAL DATA:  Fracture, postop. EXAM: LEFT FEMUR PORTABLE 2 VIEWS COMPARISON:  Preoperative imaging FINDINGS: Lateral plate and screw fixation of distal femur fracture. Improved fracture alignment from preoperative imaging. Previous knee arthroplasty and proximal femoral hardware. Recent postsurgical change includes air and edema in the soft tissues. IMPRESSION: ORIF of distal femur fracture, in improved alignment from preoperative imaging. Electronically Signed   By: Andrea Gasman M.D.   On: 04/13/2024 15:03   DG FEMUR MIN 2 VIEWS LEFT Result Date: 04/13/2024 CLINICAL DATA:  Elective surgery. EXAM: LEFT FEMUR 2 VIEWS COMPARISON:  Preoperative imaging FINDINGS: Seven fluoroscopic spot views of the femur submitted from the operating room. Plate and screw fixation of distal femur fracture. Previous knee arthroplasty and proximal femoral hardware. Fluoroscopy time 56 seconds. Dose 6.61 mGy. IMPRESSION: Intraoperative fluoroscopy during ORIF of distal femur fracture. Electronically Signed   By: Andrea Gasman M.D.   On: 04/13/2024 13:39   DG C-Arm 1-60 Min-No Report Result Date: 04/13/2024 Fluoroscopy was utilized by the requesting physician.  No radiographic interpretation.   DG CHEST PORT 1 VIEW Result Date: 04/12/2024 CLINICAL DATA:  Preop chest exam.  Femur fracture. EXAM: PORTABLE CHEST 1 VIEW COMPARISON:  Radiograph 02/26/2024 FINDINGS: Stable heart size and mediastinal contours. Retrocardiac hiatal hernia. No focal airspace disease, pleural effusion, pulmonary edema or pneumothorax. No acute osseous abnormalities are seen. IMPRESSION: No active disease. Electronically Signed   By: Andrea Gasman M.D.   On: 04/12/2024 19:54   CT Angio Aortobifemoral W and/or Wo Contrast Result Date: 04/12/2024 CLINICAL DATA:  Leg trauma, knee fracture.  Diminished pulses. EXAM: CT ANGIOGRAPHY OF ABDOMINAL AORTA WITH ILIOFEMORAL RUNOFF TECHNIQUE: Multidetector CT imaging of  the abdomen, pelvis and lower extremities was performed using the standard protocol during bolus administration of intravenous contrast. Multiplanar CT image reconstructions and MIPs were obtained to evaluate the vascular anatomy. RADIATION DOSE REDUCTION: This exam was performed according to the departmental dose-optimization program which includes automated exposure control, adjustment of the mA and/or kV according to patient size and/or use of iterative reconstruction technique. CONTRAST:  OMNIPAQUE  IOHEXOL  350 MG/ML SOLN COMPARISON:  Left knee x-ray same day. FINDINGS: VASCULAR Aorta: Normal caliber aorta without aneurysm, dissection, vasculitis or significant stenosis. There is calcified atherosclerotic disease throughout the aorta. Celiac: Patent without evidence of aneurysm, dissection, vasculitis  or significant stenosis. SMA: Patent without evidence of aneurysm, dissection, vasculitis or significant stenosis. Renals: Both renal arteries are patent without evidence of aneurysm, dissection, vasculitis, fibromuscular dysplasia or significant stenosis. IMA: Patent without evidence of aneurysm, dissection, vasculitis or significant stenosis. RIGHT Lower Extremity Inflow: Common, internal and external iliac arteries are patent without evidence of aneurysm, dissection, vasculitis or significant stenosis. Outflow: Common, superficial and profunda femoral arteries and the popliteal artery are patent without evidence of aneurysm, dissection, vasculitis or significant stenosis. Limited evaluation of popliteal artery secondary to streak artifact from the knee. Runoff: Patent three vessel runoff to the ankle. LEFT Lower Extremity Inflow: Common, internal and external iliac arteries are patent without evidence of aneurysm, dissection, vasculitis or significant stenosis. Outflow: Common, superficial and profunda femoral arteries are patent without evidence of aneurysm, dissection, vasculitis or significant stenosis.  There is limited evaluation of a portion of the popliteal artery secondary to streak artifact from the knee. Runoff: Patent three vessel runoff to the ankle. Veins: No obvious venous abnormality within the limitations of this arterial phase study. Review of the MIP images confirms the above findings. NON-VASCULAR Lower chest: No acute abnormality. Hepatobiliary: Gallstones are likely present. No biliary ductal dilatation. Liver is within normal limits. Pancreas: Unremarkable. No pancreatic ductal dilatation or surrounding inflammatory changes. Spleen: Normal in size without focal abnormality. Adrenals/Urinary Tract: Adrenal glands are unremarkable. Kidneys are normal, without renal calculi, focal lesion, or hydronephrosis. Bladder is unremarkable. Stomach/Bowel: There surgical changes of the proximal stomach. Small hiatal hernia is present. Appendix appears normal. No evidence of bowel wall thickening, distention, or inflammatory changes. There is sigmoid colon diverticulosis. Lymphatic: No enlarged lymph nodes are seen. Reproductive: Uterus and bilateral adnexa are unremarkable. Other: No abdominal wall hernia or abnormality. No abdominopelvic ascites. Musculoskeletal: Degenerative changes affect the left hip and spine. Left-sided hip screw and intramedullary nail are present. Bilateral knee arthroplasties are present. There is an oblique acute comminuted fracture of the distal femur at the level of the prosthesis. There is 1 cm of posterolateral displacement of the distal fracture fragment. IMPRESSION: 1. No evidence for arterial injury. Note is made that there is limited evaluation of the level of the popliteal artery secondary to streak artifact from knee arthroplasty. 2. Acute comminuted fracture of the distal left femur at the level of the knee prosthesis. 3.  No acute localizing process in the abdomen or pelvis. 4. Cholelithiasis. 5. Sigmoid colon diverticulosis. Aortic Atherosclerosis (ICD10-I70.0).  Electronically Signed   By: Greig Pique M.D.   On: 04/12/2024 17:48   DG Hip Unilat W or Wo Pelvis 2-3 Views Left Result Date: 04/12/2024 CLINICAL DATA:  Left knee pain after fall. EXAM: DG HIP (WITH OR WITHOUT PELVIS) 2-3V LEFT COMPARISON:  February 24, 2022. FINDINGS: Status post surgical internal fixation proximal left femoral intrauterine fracture. No acute fracture or dislocation is noted. IMPRESSION: No acute abnormality seen. Electronically Signed   By: Lynwood Landy Raddle M.D.   On: 04/12/2024 12:10   DG Femur 1 View Left Result Date: 04/12/2024 CLINICAL DATA:  Left knee pain after fall. EXAM: LEFT FEMUR 1 VIEW COMPARISON:  February 24, 2022. FINDINGS: Status post surgical internal fixation of proximal left femoral fracture. Status post left total knee arthroplasty. Severely displaced and possibly comminuted periprosthetic fracture of distal left femur is noted. IMPRESSION: Severely displaced and possibly comminuted distal left femoral periprosthetic fracture. Electronically Signed   By: Lynwood Landy Raddle M.D.   On: 04/12/2024 12:08   DG Knee 1-2 Views  Left Result Date: 04/12/2024 CLINICAL DATA:  Left knee pain after fall. EXAM: LEFT KNEE - 1-2 VIEW COMPARISON:  February 23, 2022. FINDINGS: Status post left total knee arthroplasty. Severely displaced and possibly comminuted periprosthetic fracture is seen involving the distal left femur. IMPRESSION: Severely displaced and possibly comminuted periprosthetic fracture involving distal left femur. Electronically Signed   By: Lynwood Landy Raddle M.D.   On: 04/12/2024 12:05     Subjective: - no chest pain, shortness of breath, no abdominal pain, nausea or vomiting.   Discharge Exam: BP 106/60 (BP Location: Left Arm)   Pulse 71   Temp 98.1 F (36.7 C) (Oral)   Resp 18   Ht 5' 5 (1.651 m)   Wt 104.3 kg   SpO2 97%   BMI 38.26 kg/m   General: Pt is alert, awake, not in acute distress Cardiovascular: RRR, S1/S2 +, no rubs, no gallops Respiratory: CTA  bilaterally, no wheezing, no rhonchi Abdominal: Soft, NT, ND, bowel sounds + Extremities: no edema, no cyanosis    The results of significant diagnostics from this hospitalization (including imaging, microbiology, ancillary and laboratory) are listed below for reference.     Microbiology: Recent Results (from the past 240 hours)  Blood culture (routine x 2)     Status: None   Collection Time: 05/02/24  5:40 PM   Specimen: BLOOD  Result Value Ref Range Status   Specimen Description BLOOD LEFT ANTECUBITAL  Final   Special Requests   Final    BOTTLES DRAWN AEROBIC AND ANAEROBIC Blood Culture adequate volume   Culture   Final    NO GROWTH 5 DAYS Performed at [facility], [address] [place], [place] [address]    Report Status 05/07/2024 FINAL  Final  Blood culture (routine x 2)     Status: None   Collection Time: 05/02/24  6:40 PM   Specimen: BLOOD  Result Value Ref Range Status   Specimen Description BLOOD RIGHT ANTECUBITAL  Final   Special Requests   Final    BOTTLES DRAWN AEROBIC AND ANAEROBIC Blood Culture results may not be optimal due to an inadequate volume of blood received in culture bottles   Culture   Final    NO GROWTH 5 DAYS Performed at [facility], [address] [place], [place] [address]    Report Status 05/07/2024 FINAL  Final  Aerobic/Anaerobic Culture w Gram Stain (surgical/deep wound)     Status: None   Collection Time: 05/04/24  9:25 AM   Specimen: Leg, Left; Tissue  Result Value Ref Range Status   Specimen Description TISSUE  Final   Special Requests LEFT KNEE INFECTION  Final   Gram Stain   Final    ABUNDANT WBC PRESENT, PREDOMINANTLY PMN NO ORGANISMS SEEN    Culture   Final    ABUNDANT ENTEROBACTER CLOACAE FEW PSEUDOMONAS AERUGINOSA NO ANAEROBES ISOLATED Performed at [facility], [address] [place], [place] [address]    Report Status 05/09/2024 FINAL  Final   Organism ID, Bacteria ENTEROBACTER CLOACAE  Final    Organism ID, Bacteria PSEUDOMONAS AERUGINOSA  Final      Susceptibility   Enterobacter cloacae - MIC*    CEFEPIME  <=0.12 SENSITIVE Sensitive     ERTAPENEM <=0.12 SENSITIVE Sensitive     CIPROFLOXACIN <=0.06 SENSITIVE Sensitive     GENTAMICIN <=1 SENSITIVE Sensitive     MEROPENEM <=0.25 SENSITIVE Sensitive     TRIMETH/SULFA <=20 SENSITIVE Sensitive  PIP/TAZO Value in next row Sensitive      <=4 SENSITIVEThis is a modified FDA-approved test that has been validated and its performance characteristics determined by the reporting laboratory.  This laboratory is certified under the Clinical Laboratory Improvement Amendments CLIA as qualified to perform high complexity clinical laboratory testing.    * ABUNDANT ENTEROBACTER CLOACAE   Pseudomonas aeruginosa - MIC*    MEROPENEM Value in next row Sensitive      <=4 SENSITIVEThis is a modified FDA-approved test that has been validated and its performance characteristics determined by the reporting laboratory.  This laboratory is certified under the Clinical Laboratory Improvement Amendments CLIA as qualified to perform high complexity clinical laboratory testing.    CIPROFLOXACIN Value in next row Sensitive      <=4 SENSITIVEThis is a modified FDA-approved test that has been validated and its performance characteristics determined by the reporting laboratory.  This laboratory is certified under the Clinical Laboratory Improvement Amendments CLIA as qualified to perform high complexity clinical laboratory testing.    IMIPENEM Value in next row Sensitive      <=4 SENSITIVEThis is a modified FDA-approved test that has been validated and its performance characteristics determined by the reporting laboratory.  This laboratory is certified under the Clinical Laboratory Improvement Amendments CLIA as qualified to perform high complexity clinical laboratory testing.    CEFTAZIDIME/AVIBACTAM Value in next row Sensitive      <=4 SENSITIVEThis is a modified  FDA-approved test that has been validated and its performance characteristics determined by the reporting laboratory.  This laboratory is certified under the Clinical Laboratory Improvement Amendments CLIA as qualified to perform high complexity clinical laboratory testing.    CEFTOLOZANE/TAZOBACTAM Value in next row Sensitive      <=4 SENSITIVEThis is a modified FDA-approved test that has been validated and its performance characteristics determined by the reporting laboratory.  This laboratory is certified under the Clinical Laboratory Improvement Amendments CLIA as qualified to perform high complexity clinical laboratory testing.    TOBRAMYCIN  Value in next row Sensitive      <=4 SENSITIVEThis is a modified FDA-approved test that has been validated and its performance characteristics determined by the reporting laboratory.  This laboratory is certified under the Clinical Laboratory Improvement Amendments CLIA as qualified to perform high complexity clinical laboratory testing.    CEFTAZIDIME Value in next row Sensitive      <=4 SENSITIVEThis is a modified FDA-approved test that has been validated and its performance characteristics determined by the reporting laboratory.  This laboratory is certified under the Clinical Laboratory Improvement Amendments CLIA as qualified to perform high complexity clinical laboratory testing.    * FEW PSEUDOMONAS AERUGINOSA  Aerobic/Anaerobic Culture w Gram Stain (surgical/deep wound)     Status: None   Collection Time: 05/04/24  9:38 AM   Specimen: Leg, Left; Tissue  Result Value Ref Range Status   Specimen Description TISSUE  Final   Special Requests LEFT KNEE INFECTION  Final   Gram Stain   Final    ABUNDANT WBC PRESENT, PREDOMINANTLY PMN NO ORGANISMS SEEN    Culture   Final    RARE PSEUDOMONAS AERUGINOSA SUSCEPTIBILITIES PERFORMED ON PREVIOUS CULTURE WITHIN THE LAST 5 DAYS. RARE ENTEROBACTER CLOACAE NO ANAEROBES ISOLATED Performed at [facility]  Lab, [address] [place], [place] [address]    Report Status 05/09/2024 FINAL  Final   Organism ID, Bacteria ENTEROBACTER CLOACAE  Final      Susceptibility   Enterobacter cloacae - MIC*  CEFEPIME  <=0.12 SENSITIVE Sensitive     ERTAPENEM <=0.12 SENSITIVE Sensitive     CIPROFLOXACIN <=0.06 SENSITIVE Sensitive     GENTAMICIN <=1 SENSITIVE Sensitive     MEROPENEM <=0.25 SENSITIVE Sensitive     TRIMETH/SULFA <=20 SENSITIVE Sensitive     PIP/TAZO Value in next row Sensitive      <=4 SENSITIVEThis is a modified FDA-approved test that has been validated and its performance characteristics determined by the reporting laboratory.  This laboratory is certified under the Clinical Laboratory Improvement Amendments CLIA as qualified to perform high complexity clinical laboratory testing.    * RARE ENTEROBACTER CLOACAE     Labs: Basic Metabolic Panel: Recent Labs  Lab 05/05/24 0224 05/06/24 0503 05/08/24 0115 05/09/24 0114 05/09/24 0713 05/10/24 0040  NA 134* 137 127*  --  139 138  K 3.6 3.9 3.8  --  3.5 3.6  CL 106 103 96*  --  104 105  CO2 22 25 24   --  25 24  GLUCOSE 108* 100* 111*  --  92 106*  BUN 11 11 13   --  10 10  CREATININE 0.86 0.73 0.66 0.69 0.65 0.68  CALCIUM 7.9* 8.4* 7.5*  --  8.5* 8.2*  MG 1.7 1.8  --   --   --   --    Liver Function Tests: Recent Labs  Lab 05/05/24 0224 05/06/24 0503 05/10/24 0040  AST 41 33 23  ALT 28 31 24   ALKPHOS 78 83 80  BILITOT 0.9 0.6 0.4  PROT 5.3* 5.3* 5.5*  ALBUMIN  2.1* 2.0* 2.1*   CBC: Recent Labs  Lab 05/05/24 0224 05/06/24 0503 05/08/24 0115 05/10/24 0040  WBC 7.3 6.3 6.4 5.9  HGB 9.9* 9.7* 9.2* 9.2*  HCT 31.3* 30.5* 28.8* 28.8*  MCV 97.5 97.1 96.6 96.3  PLT 284 268 284 293   CBG: No results for input(s): GLUCAP in the last 168 hours. Hgb A1c No results for input(s): HGBA1C in the last 72 hours. Lipid Profile No results for input(s): CHOL, HDL, LDLCALC, TRIG, CHOLHDL, LDLDIRECT in the last 72  hours. Thyroid function studies No results for input(s): TSH, T4TOTAL, T3FREE, THYROIDAB in the last 72 hours.  Invalid input(s): FREET3 Urinalysis    Component Value Date/Time   LABSPEC 1.015 03/21/2016 2008   PHURINE 6.5 03/21/2016 2008   GLUCOSEU NEGATIVE 03/21/2016 2008   HGBUR NEGATIVE 03/21/2016 2008   BILIRUBINUR NEGATIVE 03/21/2016 2008   KETONESUR NEGATIVE 03/21/2016 2008   PROTEINUR NEGATIVE 03/21/2016 2008   UROBILINOGEN 0.2 03/21/2016 2008   NITRITE NEGATIVE 03/21/2016 2008   LEUKOCYTESUR NEGATIVE 03/21/2016 2008    FURTHER DISCHARGE INSTRUCTIONS:   Get Medicines reviewed and adjusted: Please take all your medications with you for your next visit with your Primary MD   Laboratory/radiological data: Please request your Primary MD to go over all hospital tests and procedure/radiological results at the follow up, please ask your Primary MD to get all Hospital records sent to his/her office.   In some cases, they will be blood work, cultures and biopsy results pending at the time of your discharge. Please request that your primary care M.D. goes through all the records of your hospital data and follows up on these results.   Also Note the following: If you experience worsening of your admission symptoms, develop shortness of breath, life threatening emergency, suicidal or homicidal thoughts you must seek medical attention immediately by calling 911 or calling your MD immediately  if symptoms less severe.   You must  read complete instructions/literature along with all the possible adverse reactions/side effects for all the Medicines you take and that have been prescribed to you. Take any new Medicines after you have completely understood and accpet all the possible adverse reactions/side effects.    Do not drive when taking Pain medications or sleeping medications (Benzodaizepines)   Do not take more than prescribed Pain, Sleep and Anxiety Medications. It is not  advisable to combine anxiety,sleep and pain medications without talking with your primary care practitioner   Special Instructions: If you have smoked or chewed Tobacco  in the last 2 yrs please stop smoking, stop any regular Alcohol   and or any Recreational drug use.   Wear Seat belts while driving.   Please note: You were cared for by a hospitalist during your hospital stay. Once you are discharged, your primary care physician will handle any further medical issues. Please note that NO REFILLS for any discharge medications will be authorized once you are discharged, as it is imperative that you return to your primary care physician (or establish a relationship with a primary care physician if you do not have one) for your post hospital discharge needs so that they can reassess your need for medications and monitor your lab values.  Time coordinating discharge: 35 minutes  SIGNED:  Cline Draheim, MD, PhD 05/11/2024, 8:41 AM

## 2024-05-30 ENCOUNTER — Telehealth: Payer: Self-pay

## 2024-05-30 NOTE — Telephone Encounter (Signed)
 Patient left a voicemail stating that she has oral thrush. Returned patient call - she stated that she contacted her PCP and that nystatin mouthwash was sent in.    Cordie Buening SHAUNNA Letters, CMA

## 2024-05-31 ENCOUNTER — Encounter: Payer: Self-pay | Admitting: Family

## 2024-05-31 ENCOUNTER — Other Ambulatory Visit: Payer: Self-pay

## 2024-05-31 ENCOUNTER — Ambulatory Visit (INDEPENDENT_AMBULATORY_CARE_PROVIDER_SITE_OTHER): Payer: Self-pay | Admitting: Family

## 2024-05-31 VITALS — BP 128/78 | HR 60 | Temp 97.8°F | Resp 16 | Wt 221.9 lb

## 2024-05-31 DIAGNOSIS — T847XXD Infection and inflammatory reaction due to other internal orthopedic prosthetic devices, implants and grafts, subsequent encounter: Secondary | ICD-10-CM | POA: Diagnosis not present

## 2024-05-31 DIAGNOSIS — Z95828 Presence of other vascular implants and grafts: Secondary | ICD-10-CM | POA: Insufficient documentation

## 2024-05-31 LAB — LAB REPORT - SCANNED: EGFR: 84

## 2024-05-31 NOTE — Patient Instructions (Addendum)
 Nice to see you.  Continue to take your medication as prescribed.   Continue PICC line care.   End of treatment planned for 06/19/24 and then transition to oral.  Plan for follow up in 3 weeks.   Have a great day and stay safe!

## 2024-05-31 NOTE — Progress Notes (Signed)
 Subjective:   Patient ID: Kara Acevedo, female    DOB: 1955-10-14, 68 y.o.   MRN: [id number]  Chief Complaint  Patient presents with   Hospitalization Follow-up    Post surgical wound infection with hardware     HPI:  Kara Acevedo is a 68 y.o. female recently admitted to the hospital after periprosthetic distal femur fracture requiring ORIF presenting with erythema and drainage from her surgical wound and found to have postoperative infected hematoma/seroma and status postdebridement.  Surgical specimens positive for Enterobacter cloacae and Pseudomonas aeruginosa.  Plan of care was for 6 weeks of meropenem followed by suppression with ciprofloxacin secondary to presence of hardware.  Blood work reviewed on 05/23/2024 with sedimentation rate of 47 and CRP of 2 and on 05/30/2024 with sedimentation rate of 55 and CRP of 3.  Here today for hospitalization follow-up.  Ms. Nuttle has been doing well since her last office visit and continues to receive meropenem as prescribed with no adverse side effects.  PICC line functioning appropriately without complication.  Able to ambulate and perform activities of daily living.  Seen by orthopedics with expected healing and sutures removed.  No fevers, chills, or sweats.  Has questions about lab work.    Allergies  Allergen Reactions   Ace Inhibitors Other (See Comments) and Cough    Uvula swelling   Doxycycline Nausea And Vomiting   Latex Other (See Comments)    Blisters    Sulfa Antibiotics Rash   Fluconazole Other (See Comments)    Feels like a burning sensation from inside out   Nickel Swelling and Dermatitis    Blistering      Outpatient Medications Prior to Visit  Medication Sig Dispense Refill   amphetamine -dextroamphetamine  (ADDERALL) 20 MG tablet Take 20 mg by mouth daily.     apixaban  (ELIQUIS ) 2.5 MG TABS tablet Take 1 tablet (2.5 mg total) by mouth 2 (two) times daily. 60 tablet 0    cyanocobalamin  (VITAMIN B12) 1000 MCG/ML injection      docusate sodium  (COLACE) 100 MG capsule Take 1 capsule (100 mg total) by mouth 2 (two) times daily. 10 capsule 0   Ensure Max Protein (ENSURE MAX PROTEIN) LIQD Take 330 mLs (11 oz total) by mouth 2 (two) times daily. 10000 mL 0   hydrocortisone 2.5 % cream Apply 1 Application topically daily as needed (for rash).     ibuprofen  (ADVIL ,MOTRIN ) 800 MG tablet Take 800 mg by mouth daily as needed for moderate pain (pain score 4-6) or mild pain (pain score 1-3).     meropenem (MERREM) IVPB Inject 1 g into the vein every 8 (eight) hours. Indication:  hardware associated wound infection First Dose: Yes Last Day of Therapy:  06/19/2024 Labs - Once weekly:  CBC/D and BMP, Labs - Once weekly: ESR and CRP 41 Units 0   methocarbamol  (ROBAXIN ) 500 MG tablet Take 1 tablet (500 mg total) by mouth every 6 (six) hours as needed for muscle spasms. 28 tablet 0   Multiple Vitamin (MULTIVITAMIN WITH MINERALS) TABS tablet Take 1 tablet by mouth daily. 30 tablet 0   ondansetron  (ZOFRAN ) 4 MG tablet Take 4 mg by mouth daily as needed for nausea or vomiting.     oxyCODONE  (OXY IR/ROXICODONE ) 5 MG immediate release tablet Take 1 tablet (5 mg total) by mouth every 4 (four) hours as needed for severe pain (pain score 7-10). 10 tablet 0   saccharomyces boulardii (FLORASTOR) 250 MG capsule Take 1 capsule (250 mg  total) by mouth 2 (two) times daily.     tirzepatide (ZEPBOUND) 2.5 MG/0.5ML Pen Inject 2.5 mg into the skin once a week. Wednesday     Vitamin D , Ergocalciferol , (DRISDOL ) 1.25 MG (50000 UNIT) CAPS capsule Take 1 capsule (50,000 Units total) by mouth every 7 (seven) days. 5 capsule 0   No facility-administered medications prior to visit.     Past Medical History:  Diagnosis Date   Arthritis    Hypertension      Past Surgical History:  Procedure Laterality Date   INTRAMEDULLARY (IM) NAIL INTERTROCHANTERIC Left 02/24/2022   Procedure: INTRAMEDULLARY (IM)  NAIL INTERTROCHANTRIC;  Surgeon: Fidel Rogue, MD;  Location: [facility];  Service: Orthopedics;  Laterality: Left;   IRRIGATION AND DEBRIDEMENT KNEE Left 05/04/2024   Procedure: IRRIGATION AND DEBRIDEMENT KNEE;  Surgeon: Kendal Franky SQUIBB, MD;  Location: [facility] OR;  Service: Orthopedics;  Laterality: Left;   ORIF FEMUR FRACTURE Left 04/13/2024   Procedure: OPEN REDUCTION INTERNAL FIXATION (ORIF) DISTAL FEMUR FRACTURE, LEFT;  Surgeon: Kendal Franky SQUIBB, MD;  Location: [facility] OR;  Service: Orthopedics;  Laterality: Left;       Review of Systems  Constitutional:  Negative for chills, diaphoresis, fatigue and fever.  Respiratory:  Negative for cough, chest tightness, shortness of breath and wheezing.   Cardiovascular:  Negative for chest pain.  Gastrointestinal:  Negative for abdominal pain, diarrhea, nausea and vomiting.    Objective:   BP 128/78   Pulse 60   Temp 97.8 F (36.6 C)   Resp 16   Wt 221 lb 14.4 oz (100.7 kg)   SpO2 99%   BMI 36.93 kg/m  Nursing note and vital signs reviewed.  Physical Exam Constitutional:      General: She is not in acute distress.    Appearance: She is well-developed.  Cardiovascular:     Rate and Rhythm: Normal rate and regular rhythm.     Heart sounds: Normal heart sounds.  Pulmonary:     Effort: Pulmonary effort is normal.     Breath sounds: Normal breath sounds.  Skin:    General: Skin is warm and dry.  Neurological:     Mental Status: She is alert and oriented to person, place, and time.          No data to display           Assessment & Plan:    Patient Active Problem List   Diagnosis Date Noted   Status post peripherally inserted central catheter (PICC) central line placement 05/31/2024   Wound infection 05/10/2024   Hardware complicating wound infection 05/06/2024   Post-operative infection 05/02/2024   History of bariatric surgery 05/02/2024   Periprosthetic fracture of shaft of femur 04/15/2024   Hip fracture (HCC) 02/23/2022    Hypokalemia 02/23/2022   ADHD 02/23/2022   Essential hypertension 02/23/2022   Rib pain 02/23/2022   Morbid obesity (HCC) 02/23/2022   Esophageal reflux 08/19/2012     Problem List Items Addressed This Visit       Other   Hardware complicating wound infection - Primary   Surgical incision healing well with 2 small areas of scab remaining without drainage or evidence of infection.  Discussed plan of care to continue current dose of meropenem through 06/19/2024 as planned with transition to ciprofloxacin for suppression given presence of hardware with duration of therapy to be determined although cannot exclude lifelong.  Plan for follow-up in 3 weeks or sooner if needed near end date.  Status post peripherally inserted central catheter (PICC) central line placement   PICC line functioning with no complications and dressing is clean, dry, and intact.  Continue PICC line care per protocol.        I am having Dhalia Zingaro. Salinas-Johnson maintain her ibuprofen , cyanocobalamin , hydrocortisone, ondansetron , amphetamine -dextroamphetamine , Zepbound, apixaban , methocarbamol , Vitamin D  (Ergocalciferol ), multivitamin with minerals, Ensure Max Protein, docusate sodium , meropenem, oxyCODONE , and saccharomyces boulardii.   Follow-up: Return in about 3 weeks (around 06/21/2024). or sooner if needed.   Cathlyn July, MSN, FNP-C Nurse Practitioner [facility] Main number: [phone]

## 2024-05-31 NOTE — Assessment & Plan Note (Signed)
 PICC line functioning with no complications and dressing is clean, dry, and intact.  Continue PICC line care per protocol.

## 2024-05-31 NOTE — Assessment & Plan Note (Signed)
 Surgical incision healing well with 2 small areas of scab remaining without drainage or evidence of infection.  Discussed plan of care to continue current dose of meropenem through 06/19/2024 as planned with transition to ciprofloxacin for suppression given presence of hardware with duration of therapy to be determined although cannot exclude lifelong.  Plan for follow-up in 3 weeks or sooner if needed near end date.

## 2024-06-07 ENCOUNTER — Telehealth: Payer: Self-pay

## 2024-06-07 LAB — LAB REPORT - SCANNED: EGFR: 96

## 2024-06-07 NOTE — Telephone Encounter (Signed)
 Patient left voicemail in triage  Patient reports that their IV antibiotic therapy is scheduled to end on 11/9. Follow up appt scheduled for 11/14 and have expressed concern about avoiding overlap between the IV and any oral antibiotic regimen. Routing to provider for advise.  Kara Acevedo

## 2024-06-13 ENCOUNTER — Telehealth: Payer: Self-pay

## 2024-06-13 NOTE — Telephone Encounter (Signed)
 Beth RN with [facility] called to follow up on pts end date for antbx. Orders on HH end is for 11/9. Confirmed with opat.  RN states pt certification period ends on 11/8 and pt would not be qualified for re certification. HH is not able to remove line after last dose.  RN is asking if staff at office could remove picc or if pt should go to ED for this. Will reach out to provider to recommendations.

## 2024-06-13 NOTE — Telephone Encounter (Signed)
 Per Calone FNP pt antbx can end on 11/8 and picc can be pulled that same day.  Updated pt on this.  Understands we will transition her to oral antibiotics. Confirmed pharmacy.  Will send mychart message once antbx are sent.  Lorenda CHRISTELLA Code, RMA

## 2024-06-14 LAB — LAB REPORT - SCANNED: EGFR: 91

## 2024-06-16 ENCOUNTER — Telehealth: Payer: Self-pay

## 2024-06-16 MED ORDER — LEVOFLOXACIN 500 MG PO TABS: 500.0000 mg | ORAL_TABLET | Freq: Every day | ORAL | 6 refills | Status: AC

## 2024-06-16 NOTE — Addendum Note (Signed)
 Addended by: Kathee Tumlin D on: 06/16/2024 03:32 PM   Modules accepted: Orders

## 2024-06-16 NOTE — Telephone Encounter (Signed)
 Patient left voicemail stating her PICC line will be removed this weekend. States her pharmacy has not received any prescriptions for PO antibiotics and she would like to make sure she does not have any lapses in medication once PICC is pulled.   Would like medication sent to [facility] [address].   Yvonda Fouty, BSN, RN

## 2024-06-16 NOTE — Telephone Encounter (Signed)
 Medication sent. Start at the completion of IV treatment.

## 2024-06-24 ENCOUNTER — Ambulatory Visit: Admitting: Infectious Diseases

## 2024-06-24 ENCOUNTER — Encounter: Payer: Self-pay | Admitting: Infectious Diseases

## 2024-06-24 ENCOUNTER — Other Ambulatory Visit: Payer: Self-pay

## 2024-06-24 VITALS — BP 142/79 | HR 67 | Temp 97.2°F | Ht 66.0 in | Wt 208.0 lb

## 2024-06-24 DIAGNOSIS — T847XXD Infection and inflammatory reaction due to other internal orthopedic prosthetic devices, implants and grafts, subsequent encounter: Secondary | ICD-10-CM | POA: Diagnosis not present

## 2024-06-24 DIAGNOSIS — B9689 Other specified bacterial agents as the cause of diseases classified elsewhere: Secondary | ICD-10-CM | POA: Diagnosis not present

## 2024-06-24 DIAGNOSIS — Z5181 Encounter for therapeutic drug level monitoring: Secondary | ICD-10-CM

## 2024-06-24 DIAGNOSIS — Z95828 Presence of other vascular implants and grafts: Secondary | ICD-10-CM | POA: Diagnosis not present

## 2024-06-24 DIAGNOSIS — B965 Pseudomonas (aeruginosa) (mallei) (pseudomallei) as the cause of diseases classified elsewhere: Secondary | ICD-10-CM | POA: Diagnosis not present
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
## 2024-06-24 NOTE — Patient Instructions (Signed)
 You came in today for a follow-up visit after being treated for an infection related to your previous fracture surgery. You have been on antibiotics and are experiencing some side effects, including headaches and loose stools. Your recent lab work shows improvement, and your incisions are healing well. We discussed the importance of continuing your current treatment and monitoring for any new symptoms.  YOUR PLAN:  -INFECTION DUE TO INTERNAL ORTHOPEDIC HARDWARE: This is a chronic infection related to the hardware used in your femur surgery. You have been switched from meropenem to Levaquin for ongoing treatment. Your lab results are very good, and your incisions are healing well. It is important to continue taking Levaquin 500 mg once daily and to monitor for any signs of tendon issues or gastrointestinal problems. We will have a follow-up appointment after one month of therapy, and you will be coordinated with Dr. Overton or for further follow-up.  -HEADACHES AND LOOSE STOOLS SECONDARY TO ANTIBIOTIC THERAPY: These are side effects of the antibiotic Levaquin.  --It is important to monitor your bowel movements for any significant changes, such as increased frequency or watery stools, and to report these changes. Continue using probiotics as needed to stabilize your bowel movements. --If you notice any new joint pain that seems out of proportion for the getting back to the gym let us  know   INSTRUCTIONS:  Please continue taking Levaquin 500 mg once daily and monitor for any signs of tendon issues or gastrointestinal problems. Keep an eye on your bowel movements and report any significant changes, such as increased frequency or watery stools. Continue using fosfomycin as needed. We will have a follow-up appointment after one month of therapy, and you will be coordinated with Dr. Overton.

## 2024-06-25 LAB — CBC WITH DIFFERENTIAL/PLATELET
Absolute Lymphocytes: 1697 {cells}/uL (ref 850–3900)
Absolute Monocytes: 419 {cells}/uL (ref 200–950)
Basophils Absolute: 41 {cells}/uL (ref 0–200)
Basophils Relative: 0.9 %
Eosinophils Absolute: 120 {cells}/uL (ref 15–500)
Eosinophils Relative: 2.6 %
HCT: 41.2 % (ref 35.0–45.0)
Hemoglobin: 13.4 g/dL (ref 11.7–15.5)
MCH: 29.4 pg (ref 27.0–33.0)
MCHC: 32.5 g/dL (ref 32.0–36.0)
MCV: 90.4 fL (ref 80.0–100.0)
MPV: 9.6 fL (ref 7.5–12.5)
Monocytes Relative: 9.1 %
Neutro Abs: 2323 {cells}/uL (ref 1500–7800)
Neutrophils Relative %: 50.5 %
Platelets: 336 Thousand/uL (ref 140–400)
RBC: 4.56 Million/uL (ref 3.80–5.10)
RDW: 13 % (ref 11.0–15.0)
Total Lymphocyte: 36.9 %
WBC: 4.6 Thousand/uL (ref 3.8–10.8)

## 2024-06-25 LAB — SEDIMENTATION RATE: Sed Rate: 17 mm/h (ref 0–30)

## 2024-06-25 LAB — C-REACTIVE PROTEIN: CRP: 5.1 mg/L (ref <8.0)

## 2024-06-27 ENCOUNTER — Ambulatory Visit: Payer: Self-pay | Admitting: Infectious Diseases

## 2024-08-15 ENCOUNTER — Ambulatory Visit: Admitting: Internal Medicine

## 2024-08-29 ENCOUNTER — Ambulatory Visit: Admitting: Internal Medicine

## 2024-08-29 ENCOUNTER — Encounter: Payer: Self-pay | Admitting: Internal Medicine

## 2024-08-29 ENCOUNTER — Other Ambulatory Visit: Payer: Self-pay

## 2024-08-29 VITALS — BP 111/72 | HR 65 | Temp 97.8°F | Ht 66.0 in | Wt 217.0 lb

## 2024-08-29 DIAGNOSIS — T847XXD Infection and inflammatory reaction due to other internal orthopedic prosthetic devices, implants and grafts, subsequent encounter: Secondary | ICD-10-CM | POA: Diagnosis not present

## 2024-08-29 DIAGNOSIS — B965 Pseudomonas (aeruginosa) (mallei) (pseudomallei) as the cause of diseases classified elsewhere: Secondary | ICD-10-CM | POA: Diagnosis not present

## 2024-08-29 DIAGNOSIS — B9689 Other specified bacterial agents as the cause of diseases classified elsewhere: Secondary | ICD-10-CM

## 2024-08-29 DIAGNOSIS — M791 Myalgia, unspecified site: Secondary | ICD-10-CM

## 2024-08-29 NOTE — Progress Notes (Signed)
 "     Patient: Kara Acevedo  DOB: Oct 09, 1955 MRN: [id number] PCP: Renato Dorothey HERO, NP   Chief Complaint  Patient presents with   Follow-up      Subjective   Subjective:    History of Present Illness         Patient here for f/u left knee hardware associated infection  Pseudomonas and enterbacter on culture -- pseudomonas resistant to cefepime   Had periprosthetic distal femur fracture and had orif there  Transitioned from meropenem  to levofloxacin  06/18/2024. Crp has normalized prior to transitioning  08/29/24 [facility] f/u Seeing ortho and reported to have good imaging osseous remodeling. Once this is good plan for hardware removal Compliant with biktarvy Pain is 4/10 in this cold weather. Uses ibuprofen  in the morning  Has bilateral shoulder aches since levofloxacin  started     Past Medical History:  Diagnosis Date   Arthritis    Hypertension     Outpatient Medications Prior to Visit  Medication Sig Dispense Refill   amphetamine -dextroamphetamine  (ADDERALL) 20 MG tablet Take 20 mg by mouth daily.     cyanocobalamin  (VITAMIN B12) 1000 MCG/ML injection      docusate sodium  (COLACE) 100 MG capsule Take 1 capsule (100 mg total) by mouth 2 (two) times daily. 10 capsule 0   hydrocortisone 2.5 % cream Apply 1 Application topically daily as needed (for rash).     ibuprofen  (ADVIL ,MOTRIN ) 800 MG tablet Take 800 mg by mouth daily as needed for moderate pain (pain score 4-6) or mild pain (pain score 1-3).     indapamide  (LOZOL ) 2.5 MG tablet Take 2.5 mg by mouth every morning.     levofloxacin  (LEVAQUIN ) 500 MG tablet Take 1 tablet (500 mg total) by mouth daily. 30 tablet 6   methocarbamol  (ROBAXIN ) 500 MG tablet Take 1 tablet (500 mg total) by mouth every 6 (six) hours as needed for muscle spasms. 28 tablet 0   Multiple Vitamin (MULTIVITAMIN WITH MINERALS) TABS tablet Take 1 tablet by mouth daily. 30 tablet 0   potassium chloride  SA (KLOR-CON  M20) 20 MEQ  tablet Take 20 mEq by mouth 2 (two) times daily.     saccharomyces boulardii (FLORASTOR) 250 MG capsule Take 1 capsule (250 mg total) by mouth 2 (two) times daily.     tirzepatide (ZEPBOUND) 2.5 MG/0.5ML Pen Inject 2.5 mg into the skin once a week. Wednesday     Vitamin D , Ergocalciferol , (DRISDOL ) 1.25 MG (50000 UNIT) CAPS capsule Take 1 capsule (50,000 Units total) by mouth every 7 (seven) days. 5 capsule 0   apixaban  (ELIQUIS ) 2.5 MG TABS tablet Take 1 tablet (2.5 mg total) by mouth 2 (two) times daily. (Patient not taking: Reported on 06/24/2024) 60 tablet 0   Ensure Max Protein (ENSURE MAX PROTEIN) LIQD Take 330 mLs (11 oz total) by mouth 2 (two) times daily. 10000 mL 0   ondansetron  (ZOFRAN ) 4 MG tablet Take 4 mg by mouth daily as needed for nausea or vomiting. (Patient not taking: Reported on 08/29/2024)     oxyCODONE  (OXY IR/ROXICODONE ) 5 MG immediate release tablet Take 1 tablet (5 mg total) by mouth every 4 (four) hours as needed for severe pain (pain score 7-10). (Patient not taking: Reported on 06/24/2024) 10 tablet 0   No facility-administered medications prior to visit.     Allergies  Allergen Reactions   Ace Inhibitors Other (See Comments) and Cough    Uvula swelling   Doxycycline Nausea And Vomiting   Latex Other (See Comments)  Blisters    Sulfa Antibiotics Rash   Fluconazole Other (See Comments)    Feels like a burning sensation from inside out   Nickel Swelling and Dermatitis    Blistering    Social History   Tobacco Use   Smoking status: Never   Smokeless tobacco: Never  Substance Use Topics   Alcohol  use: No   Drug use: No    Family History  Family history unknown: Yes       Objective   Objective:   Vitals:   08/29/24 0933  BP: 111/72  Pulse: 65  Temp: 97.8 F (36.6 C)  TempSrc: Oral  SpO2: 100%  Weight: 217 lb (98.4 kg)  Height: 5' 6 (1.676 m)   Body mass index is 35.02 kg/m.  Physical Exam Constitutional:      Appearance: Normal  appearance. She is not ill-appearing.  HENT:     Mouth/Throat:     Mouth: Mucous membranes are moist.     Pharynx: Oropharynx is clear.  Eyes:     General: No scleral icterus. Cardiovascular:     Rate and Rhythm: Normal rate.  Pulmonary:     Effort: Pulmonary effort is normal.  Musculoskeletal:       Legs:     Comments: Well healed lower leg incision with small scab noted. No erythema or drainage.   Neurological:     Mental Status: She is oriented to person, place, and time.  Psychiatric:        Mood and Affect: Mood normal.        Thought Content: Thought content normal.        Assessment & Plan:     ORIF Distal Femure Fx 04/13/2024 - Surgical site infection, S/P I&D 05/04/24 - Pseudomonas (R-cefepime ) Enterobacter Cloacea - Concern for post op infection complicated by presence of orthopedic hardware following periprosthetic distal femur fracture and open reduction internal fixation. Transitioned from meropenem  to Levaquin  for suppressive therapy about a week ago. Lab work shows normal sedimentation rate and CRP, incision is well healed with one very small scab near the knee that is dry and without erythema, all indicating healing and good response to treatment. Discussed the importance of a prolonged antibiotic course due to hardware involvement and the need for close monitoring to ensure resolution of infection. Explained that the only true way to confirm cure of infection is through time testing after stopping antibiotics and monitoring closely. Discussed potential side effects of Levaquin , including tendon issues and gastrointestinal disturbances. - Continue Levaquin  500 mg once daily. - Monitor for signs of tendon issues or gastrointestinal disturbances. - Scheduled follow-up appointment after one month of Levaquin  therapy. - I coordinated follow up with Dr. Overton in 6-8 weeks to ensure ongoing tolerance of levaquin   --------------- 08/29/24 [facility] assessment Appears to be  doing clinically well in terms of wound infection suppression Myalgia ?levoflox side effect Labs today including cpk  If cpk very elevated will have to consider switching back to iv abx  F/u ortho and hopefully can remove hardware soon   F/u with me in 6-8 weeks  Continue levofloxacin     Follow up: Return in about 7 weeks (around 10/17/2024).         Constance ONEIDA Overton, MD [facility] [phone]  pager   [phone] cell 08/29/2024, 9:49 AM   "

## 2024-08-29 NOTE — Patient Instructions (Signed)
 Let your orthopedic surgeon know about shoulder pain potential side effect levofloxacin   I am checking some labs today to see if immediate need to change to meropenem . For now continue    Ideally if can remove hardware sooner better   See me again in 6-8 weeks

## 2024-08-29 NOTE — Addendum Note (Signed)
 Addended byBETHA OVERTON FAITH T on: 08/29/2024 09:54 AM   Modules accepted: Orders

## 2024-08-30 LAB — COMPLETE METABOLIC PANEL WITHOUT GFR
AG Ratio: 1.3 (calc) (ref 1.0–2.5)
ALT: 6 U/L (ref 6–29)
AST: 15 U/L (ref 10–35)
Albumin: 3.6 g/dL (ref 3.6–5.1)
Alkaline phosphatase (APISO): 88 U/L (ref 37–153)
BUN: 24 mg/dL (ref 7–25)
CO2: 31 mmol/L (ref 20–32)
Calcium: 9.2 mg/dL (ref 8.6–10.4)
Chloride: 102 mmol/L (ref 98–110)
Creat: 0.89 mg/dL (ref 0.50–1.05)
Globulin: 2.8 g/dL (ref 1.9–3.7)
Glucose, Bld: 76 mg/dL (ref 65–99)
Potassium: 3 mmol/L — ABNORMAL LOW (ref 3.5–5.3)
Sodium: 141 mmol/L (ref 135–146)
Total Bilirubin: 0.6 mg/dL (ref 0.2–1.2)
Total Protein: 6.4 g/dL (ref 6.1–8.1)

## 2024-08-30 LAB — CBC WITH DIFFERENTIAL/PLATELET
Absolute Lymphocytes: 1914 {cells}/uL (ref 850–3900)
Absolute Monocytes: 564 {cells}/uL (ref 200–950)
Basophils Absolute: 48 {cells}/uL (ref 0–200)
Basophils Relative: 0.8 %
Eosinophils Absolute: 192 {cells}/uL (ref 15–500)
Eosinophils Relative: 3.2 %
HCT: 38.8 % (ref 35.9–46.0)
Hemoglobin: 12.7 g/dL (ref 11.7–15.5)
MCH: 29.7 pg (ref 27.0–33.0)
MCHC: 32.7 g/dL (ref 31.6–35.4)
MCV: 90.7 fL (ref 81.4–101.7)
MPV: 9.8 fL (ref 7.5–12.5)
Monocytes Relative: 9.4 %
Neutro Abs: 3282 {cells}/uL (ref 1500–7800)
Neutrophils Relative %: 54.7 %
Platelets: 296 Thousand/uL (ref 140–400)
RBC: 4.28 Million/uL (ref 3.80–5.10)
RDW: 14 % (ref 11.0–15.0)
Total Lymphocyte: 31.9 %
WBC: 6 Thousand/uL (ref 3.8–10.8)

## 2024-08-30 LAB — C-REACTIVE PROTEIN: CRP: <3 mg/L

## 2024-08-30 LAB — CK: Total CK: 51 U/L (ref 20–243)

## 2024-09-06 ENCOUNTER — Ambulatory Visit: Payer: Self-pay | Admitting: Internal Medicine

## 2024-09-08 ENCOUNTER — Other Ambulatory Visit: Payer: Self-pay | Admitting: Internal Medicine

## 2024-09-08 DIAGNOSIS — Z1231 Encounter for screening mammogram for malignant neoplasm of breast: Secondary | ICD-10-CM

## 2024-09-16 ENCOUNTER — Ambulatory Visit: Admission: RE | Admit: 2024-09-16 | Source: Ambulatory Visit

## 2024-09-16 DIAGNOSIS — Z1231 Encounter for screening mammogram for malignant neoplasm of breast: Secondary | ICD-10-CM

## 2024-10-12 ENCOUNTER — Ambulatory Visit: Payer: Self-pay | Admitting: Internal Medicine

## 2024-10-26 ENCOUNTER — Encounter: Admitting: Obstetrics and Gynecology
